# Patient Record
Sex: Female | Born: 1985 | Race: Black or African American | Hispanic: No | Marital: Married | Smoking: Never smoker
Health system: Southern US, Community
[De-identification: ages and names within clinical notes are randomized; demographics above are authoritative.]

## PROBLEM LIST (undated history)

## (undated) DIAGNOSIS — Z9289 Personal history of other medical treatment: Secondary | ICD-10-CM

## (undated) DIAGNOSIS — Z973 Presence of spectacles and contact lenses: Secondary | ICD-10-CM

## (undated) DIAGNOSIS — O021 Missed abortion: Secondary | ICD-10-CM

## (undated) DIAGNOSIS — R011 Cardiac murmur, unspecified: Secondary | ICD-10-CM

## (undated) DIAGNOSIS — D571 Sickle-cell disease without crisis: Secondary | ICD-10-CM

## (undated) DIAGNOSIS — E559 Vitamin D deficiency, unspecified: Secondary | ICD-10-CM

## (undated) DIAGNOSIS — L309 Dermatitis, unspecified: Secondary | ICD-10-CM

## (undated) DIAGNOSIS — G8929 Other chronic pain: Secondary | ICD-10-CM

## (undated) HISTORY — DX: Sickle-cell disease without crisis: D57.1

## (undated) HISTORY — PX: CHOLECYSTECTOMY: SHX55

## (undated) HISTORY — DX: Personal history of other medical treatment: Z92.89

## (undated) HISTORY — DX: Cardiac murmur, unspecified: R01.1

## (undated) NOTE — *Deleted (*Deleted)
[facility] - Clinic Note  09/05/2020     CHIEF COMPLAINT Patient presents for No chief complaint on file.   HISTORY OF PRESENT ILLNESS: Krista [name] is a 13 y.o. female who presents to the clinic today for:   Pt saw Dr. Sherryll Burger on 10.05.21 and received IVO OS, pt states in the last month or 2, she feels like her left eye is affecting her overall vision, she states she has to wear her glasses more often especially while driving  Referring physician: DeMarco, [place] OD [address] [place], [place] [address]   HISTORICAL INFORMATION:   Selected notes from the MEDICAL RECORD NUMBER Referral from Dr. [place] DeMarco for macular edema OS  LEE: 11.18.20, BCVA OD: 20/20 OS: 20/30+2  PMH: sickle cell disease, chronic pain, heart murmer, hx of retinal vasculitits    CURRENT MEDICATIONS: Current Outpatient Medications (Ophthalmic Drugs)  Medication Sig  . Bromfenac Sodium (PROLENSA) 0.07 % SOLN Place 1 drop into the left eye 4 (four) times daily.  . dorzolamide-timolol (COSOPT) 22.3-6.8 MG/ML ophthalmic solution Place 1 drop into the left eye 2 (two) times daily.  Marland Kitchen ketorolac (ACULAR) 0.5 % ophthalmic solution Place 1 drop into the left eye 4 (four) times daily. (Patient not taking: Reported on 08/27/2020)  . prednisoLONE acetate (PRED FORTE) 1 % ophthalmic suspension Place 1 drop into the left eye 4 (four) times daily.   No current facility-administered medications for this visit. (Ophthalmic Drugs)   Current Outpatient Medications (Other)  Medication Sig  . Cholecalciferol (VITAMIN D3) 25 MCG (1000 UT) CAPS TAKE 1 CAPSULE BY MOUTH ONCE DAILY  . folic acid (FOLVITE) 1 MG tablet Take 1 tablet (1 mg total) by mouth daily.  Marland Kitchen ibuprofen (ADVIL) 800 MG tablet Take 1 tablet (800 mg total) by mouth every 8 (eight) hours as needed. (Patient taking differently: Take 800 mg by mouth every 8 (eight) hours as needed for moderate pain. )  . mometasone (ELOCON)  0.1 % cream Apply 1 application topically as needed.  Marland Kitchen oxycodone (OXY-IR) 5 MG capsule Take 1 capsule (5 mg total) by mouth every 4 (four) hours as needed.  . Prenatal Vit-Fe Fumarate-FA (PRENATAL MULTIVITAMIN) TABS tablet Take 1 tablet by mouth daily at 12 noon. (Patient not taking: Reported on 08/27/2020)   No current facility-administered medications for this visit. (Other)      REVIEW OF SYSTEMS:    ALLERGIES Allergies  Allergen Reactions  . Keflex [Cephalexin] Swelling    Face swelling    PAST MEDICAL HISTORY Past Medical History:  Diagnosis Date  . Chronic pain    with sickle cell   . Eczema   . Heart murmur    07-13-2019  per pt since childhood,  has not ever had a echo done ,  asymptomatic  . History of blood transfusion    x3   . Missed ab   . Sickle cell anemia (HCC)    followed by pcp---  last crisis 12-14-2018  . Vitamin D deficiency   . Wears glasses    Past Surgical History:  Procedure Laterality Date  . EYE SURGERY Left 11/12/2019  . LAPAROSCOPIC CHOLECYSTECTOMY  1997    FAMILY HISTORY Family History  Problem Relation Age of Onset  . Diabetes Father   . Hypertension Father   . Heart disease Brother   . Cancer Maternal Grandmother   . Heart disease Brother   . Breast cancer Paternal Aunt   . Cancer Paternal Uncle  SOCIAL HISTORY Social History   Tobacco Use  . Smoking status: Never Smoker  . Smokeless tobacco: Never Used  Vaping Use  . Vaping Use: Never used  Substance Use Topics  . Alcohol use: Not Currently    Comment: socially  . Drug use: Never         OPHTHALMIC EXAM:  Not recorded     IMAGING AND PROCEDURES  Imaging and Procedures for @TODAY @           ASSESSMENT/PLAN:    ICD-10-CM   1. 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 Retinal edema  H35.81 OCT, Retina - OU - Both Eyes  4. 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 Ocular hypertension of left eye  H40.052   6. Hypertensive retinopathy of both eyes  H35.033    1,2. Peripheral chorioretinal inflammation and vasculitis OS  - s/p IVO #1 w/ Dr. Sherryll Burger (03.23.21), s/p IVO #2 (10.05.21)  - had baby in August 2021  - pt reports vague history "vasculitis" OS -- first diagnosed in ?[place] in 2016 -- doesn't think she saw a retina specialist  - states in 2016 had an episode of seeing spots then vision loss OS, told she had "vasculitis"  - +vitreous cell  - exam shows peripapillary atrophy and pigment deposition  - OCT with diffuse peripheral ellipsoid loss with central sparing + central CME slightly increased today  - FA (12.02.20) shows mild, late perivascular leakage, peripheral midzone OS (very late appearing--likely increased signal if study were allowed to continue beyond 10 min); OD normal study  - BCVA slightly decreased to 20/50 from 20/40 OS, but 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 Vira Browns, Uveitis/Retina expert, who initiated extensive work up and advised laser PRP             - s/p PRP OS (01.25.21) -- good laser changes 360  - s/p IVO #1 (Dr. Sherryll Burger, 03.23.21), s/p IVO #2 (10.05.21)  - cont PF and Prolensa qid OS  - IOP elevated to 28 OS -- ?steroid response  - start Cosopt BID OS  - f/u 1-2 weeks, IOP check  3. Retinal edema  - OCT shows interval improvement in central CME/IRF, but FA has no corresponding central hyperfluorescent leakage/staining--petaloid or otherwise  4. History of Sickle Cell SS disease  - no SS retinopathy  5. Ocular Hypertension OS  - possible steroid response  - IOP 28 today  - will start Cosopt BID OS  Ophthalmic Meds Ordered this visit:  No orders of the defined types were placed in this encounter.      No follow-ups on file.  There are no Patient Instructions on file for this visit.   Explained the diagnoses, plan, and  follow up with the patient and they expressed understanding.  Patient expressed understanding of the importance of proper follow up care.   This document serves as a record of services personally performed by Karie Chimera, MD, PhD. It was created on their behalf by Glee Arvin. Manson Passey, OA an ophthalmic technician. The creation of this record is the provider's dictation and/or activities during the visit.    Electronically signed by: Glee Arvin. [place], [place] 11.17.2021 8:07 AM  Karie Chimera, M.D., Ph.D. Diseases & Surgery of the Retina and Vitreous [facility]  Abbreviations: M myopia (nearsighted); A astigmatism; H hyperopia (farsighted); P presbyopia; Mrx spectacle prescription;  CTL contact lenses; OD right eye; OS left eye; OU both eyes  XT exotropia; ET esotropia; PEK punctate epithelial keratitis; PEE punctate epithelial erosions; DES dry eye syndrome; MGD meibomian gland dysfunction; ATs artificial tears; PFAT's preservative free artificial tears; NSC nuclear sclerotic cataract; PSC posterior subcapsular cataract; ERM epi-retinal membrane; PVD posterior vitreous detachment; RD retinal detachment; DM diabetes mellitus; DR diabetic retinopathy; NPDR non-proliferative diabetic retinopathy; PDR proliferative diabetic retinopathy; CSME clinically significant macular edema; DME diabetic macular edema; dbh dot blot hemorrhages; CWS cotton wool spot; POAG primary open angle glaucoma; C/D cup-to-disc ratio; HVF humphrey visual field; GVF goldmann visual field; OCT optical coherence tomography; IOP intraocular pressure; BRVO Branch retinal vein occlusion; CRVO central retinal vein occlusion; CRAO central retinal artery occlusion; BRAO branch retinal artery occlusion; RT retinal tear; SB scleral buckle; PPV pars plana vitrectomy; VH Vitreous hemorrhage; PRP panretinal laser photocoagulation; IVK intravitreal kenalog; VMT vitreomacular traction; MH Macular hole;  NVD neovascularization of  the disc; NVE neovascularization elsewhere; AREDS age related eye disease study; ARMD age related macular degeneration; POAG primary open angle glaucoma; EBMD epithelial/anterior basement membrane dystrophy; ACIOL anterior chamber intraocular lens; IOL intraocular lens; PCIOL posterior chamber intraocular lens; Phaco/IOL phacoemulsification with intraocular lens placement; PRK photorefractive keratectomy; LASIK laser assisted in situ keratomileusis; HTN hypertension; DM diabetes mellitus; COPD chronic obstructive pulmonary disease

---

## 2007-07-17 ENCOUNTER — Emergency Department (HOSPITAL_COMMUNITY): Admission: EM | Admit: 2007-07-17 | Discharge: 2007-07-18 | Payer: Self-pay | Admitting: Emergency Medicine

## 2007-07-20 ENCOUNTER — Encounter (HOSPITAL_COMMUNITY): Admission: RE | Admit: 2007-07-20 | Discharge: 2007-09-20 | Payer: Self-pay | Admitting: Internal Medicine

## 2009-11-19 ENCOUNTER — Encounter: Admission: RE | Admit: 2009-11-19 | Discharge: 2009-11-19 | Payer: Self-pay | Admitting: Family Medicine

## 2010-04-28 ENCOUNTER — Encounter: Admission: RE | Admit: 2010-04-28 | Discharge: 2010-04-28 | Payer: Self-pay | Admitting: Internal Medicine

## 2011-07-29 LAB — HEPATIC FUNCTION PANEL
AST: 32
Albumin: 4.3
Alkaline Phosphatase: 61
Total Protein: 8

## 2011-07-29 LAB — DIFFERENTIAL
Basophils Absolute: 0.1
Eosinophils Relative: 0
Neutro Abs: 6

## 2011-07-29 LAB — URINALYSIS, ROUTINE W REFLEX MICROSCOPIC
Bilirubin Urine: NEGATIVE
Ketones, ur: NEGATIVE
Nitrite: NEGATIVE

## 2011-07-29 LAB — CBC
HCT: 17.4 — ABNORMAL LOW
Hemoglobin: 6.1 — CL
MCHC: 35
MCV: 92.3
Platelets: 385

## 2011-07-29 LAB — URINE MICROSCOPIC-ADD ON

## 2011-07-29 LAB — CROSSMATCH
ABO/RH(D): B POS
Antibody Screen: NEGATIVE

## 2011-07-29 LAB — BASIC METABOLIC PANEL
BUN: 11
Calcium: 9.6
Chloride: 101
Glucose, Bld: 107 — ABNORMAL HIGH
Potassium: 4

## 2011-07-29 LAB — PREGNANCY, URINE: Preg Test, Ur: NEGATIVE

## 2011-07-29 LAB — RETICULOCYTES: Retic Count, Absolute: 28.3

## 2013-02-03 ENCOUNTER — Ambulatory Visit (INDEPENDENT_AMBULATORY_CARE_PROVIDER_SITE_OTHER): Payer: BC Managed Care – PPO | Admitting: Physician Assistant

## 2013-02-03 VITALS — BP 112/73 | HR 103 | Temp 98.3°F | Resp 16 | Ht 68.5 in | Wt 137.8 lb

## 2013-02-03 DIAGNOSIS — R112 Nausea with vomiting, unspecified: Secondary | ICD-10-CM

## 2013-02-03 DIAGNOSIS — M2550 Pain in unspecified joint: Secondary | ICD-10-CM

## 2013-02-03 DIAGNOSIS — J029 Acute pharyngitis, unspecified: Secondary | ICD-10-CM

## 2013-02-03 DIAGNOSIS — R197 Diarrhea, unspecified: Secondary | ICD-10-CM

## 2013-02-03 DIAGNOSIS — D571 Sickle-cell disease without crisis: Secondary | ICD-10-CM | POA: Insufficient documentation

## 2013-02-03 DIAGNOSIS — D5700 Hb-SS disease with crisis, unspecified: Secondary | ICD-10-CM | POA: Insufficient documentation

## 2013-02-03 LAB — POCT CBC
Granulocyte percent: 57.7 %G (ref 37–80)
HCT, POC: 32.3 % — AB (ref 37.7–47.9)
Hemoglobin: 10 g/dL — AB (ref 12.2–16.2)
Lymph, poc: 3 (ref 0.6–3.4)
MCH, POC: 31.1 pg (ref 27–31.2)
MCHC: 31 g/dL — AB (ref 31.8–35.4)
MCV: 100.3 fL — AB (ref 80–97)
MID (cbc): 1 — AB (ref 0–0.9)
POC Granulocyte: 5.4 (ref 2–6.9)
POC LYMPH PERCENT: 32.1 %L (ref 10–50)
Platelet Count, POC: 624 10*3/uL — AB (ref 142–424)
WBC: 9.4 10*3/uL (ref 4.6–10.2)

## 2013-02-03 MED ORDER — MELOXICAM 15 MG PO TABS: 15.0000 mg | ORAL_TABLET | Freq: Every day | ORAL | Status: DC

## 2013-02-03 MED ORDER — ONDANSETRON 8 MG PO TBDP: 8.0000 mg | ORAL_TABLET | Freq: Three times a day (TID) | ORAL | Status: DC | PRN

## 2013-02-03 NOTE — Patient Instructions (Signed)
Get plenty of rest and drink at least 64 ounces of water daily. 

## 2013-02-03 NOTE — Progress Notes (Signed)
Subjective:    Patient ID: Krista [name], female    DOB: Mar 23, 1986, 27 y.o.   MRN: [id number]  HPI This 27 y.o. female presents for evaluation of nausea, vomiting and chills (after vomiting).  Symptoms began the evening of 02/01/2013.  Diarrhea began today.  No known sick contacts. No fever.  Achiness in her joints.  Crampy abdominal pain. Thinks she had a flu vaccine this season.   Past Medical History  Diagnosis Date  . Sickle cell anemia   . Heart murmur   . History of blood transfusion     Past Surgical History  Procedure Laterality Date  . Cholecystectomy      Prior to Admission medications   Medication Sig Start Date End Date Taking? Authorizing Provider  folic acid (FOLVITE) 1 MG tablet Take 1 mg by mouth daily.   Yes Historical Provider, MD  norgestimate-ethinyl estradiol (ORTHO-CYCLEN,SPRINTEC,PREVIFEM) 0.25-35 MG-MCG tablet Take 1 tablet by mouth daily.   Yes Historical Provider, MD    No Known Allergies  History   Social History  . Marital Status: Single    Spouse Name: n/a    Number of Children: 0  . Years of Education: 16+   Occupational History  . TEACHER [employer]   Social History Main Topics  . Smoking status: Never Smoker   . Smokeless tobacco: Never Used  . Alcohol Use: 0 - 1 oz/week    0-2 drink(s) per week     Comment: drinks only once a month a less.  . Drug Use: No  . Sexually Active: Yes   Other Topics Concern  . Not on file   Social History Narrative   Lives alone.  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 Currently teaches at [employer].    Family History  Problem Relation Age of Onset  . Diabetes Father   . Heart disease Brother   . Cancer Maternal Grandmother   . Heart disease Brother     Review of Systems As above.    Objective:   Physical Exam Blood pressure 112/73, pulse 103, temperature 98.3 F (36.8 C), temperature source Oral, resp. rate 16, height 5' 8.5" (1.74  m), weight 137 lb 12.8 oz (62.506 kg), last menstrual period 01/03/2013, SpO2 99.00%. Body mass index is 20.65 kg/(m^2). Well-developed, well nourished BF who is awake, alert and oriented, in NAD. HEENT: [place]/AT, PERRL, EOMI.  Sclera and conjunctiva are clear.  EAC are patent, TMs are normal in appearance. Nasal mucosa is pink and moist. OP is clear. Neck: supple, non-tender, no lymphadenopathy, thyromegaly. Heart: RRR, no murmur Lungs: normal effort, CTA Abdomen: normo-active bowel sounds, supple, no mass or organomegaly. Epigastric abdominal tenderness.  No referred or rebound tenderness.  No guarding. Extremities: no cyanosis, clubbing or edema. Skin: warm and dry without rash. Psychologic: good mood and appropriate affect, normal speech and behavior.    Results for orders placed in visit on 02/03/13  POCT INFLUENZA A/B      Result Value Range   Influenza A, POC Negative     Influenza B, POC Negative    POCT RAPID STREP A (OFFICE)      Result Value Range   Rapid Strep A Screen Negative  Negative  POCT CBC      Result Value Range   WBC 9.4  4.6 - 10.2 K/uL   Lymph, poc 3.0  0.6 - 3.4   POC LYMPH PERCENT 32.1  10 - 50 %L   MID (  cbc) 1.0 (*) 0 - 0.9   POC MID % 10.2  0 - 12 %M   POC Granulocyte 5.4  2 - 6.9   Granulocyte percent 57.7  37 - 80 %G   RBC 3.22 (*) 4.04 - 5.48 M/uL   Hemoglobin 10.0 (*) 12.2 - 16.2 g/dL   HCT, POC 16.1 (*) 09.6 - 47.9 %   MCV 100.3 (*) 80 - 97 fL   MCH, POC 31.1  27 - 31.2 pg   MCHC 31.0 (*) 31.8 - 35.4 g/dL   RDW, POC 04.5     Platelet Count, POC 624 (*) 142 - 424 K/uL   MPV 8.2  0 - 99.8 fL       Assessment & Plan:  Nausea with vomiting, Diarrhea - Plan: ondansetron (ZOFRAN-ODT) 8 MG disintegrating tablet  Acute pharyngitis - Plan: POCT Influenza A/B, POCT rapid strep A, POCT CBC, meloxicam (MOBIC) 15 MG tablet  Joint pain - Plan: meloxicam (MOBIC) 15 MG tablet  Supportive care, anticipatory guidance provided.  Fernande Bras,  PA-C Physician Assistant-Certified [facility]

## 2013-02-05 ENCOUNTER — Telehealth: Payer: Self-pay | Admitting: Radiology

## 2013-02-05 ENCOUNTER — Other Ambulatory Visit: Payer: Self-pay | Admitting: Radiology

## 2013-02-05 ENCOUNTER — Ambulatory Visit (INDEPENDENT_AMBULATORY_CARE_PROVIDER_SITE_OTHER): Payer: BC Managed Care – PPO | Admitting: Family Medicine

## 2013-02-05 ENCOUNTER — Encounter: Payer: Self-pay | Admitting: Family Medicine

## 2013-02-05 ENCOUNTER — Ambulatory Visit: Payer: BC Managed Care – PPO

## 2013-02-05 ENCOUNTER — Ambulatory Visit
Admission: RE | Admit: 2013-02-05 | Discharge: 2013-02-05 | Disposition: A | Discharge status: Home / Self Care | Payer: BC Managed Care – PPO | Source: Ambulatory Visit | Attending: Family Medicine | Admitting: Family Medicine

## 2013-02-05 VITALS — BP 103/68 | HR 81 | Temp 98.2°F | Resp 16 | Ht 67.5 in | Wt 136.8 lb

## 2013-02-05 DIAGNOSIS — D571 Sickle-cell disease without crisis: Secondary | ICD-10-CM

## 2013-02-05 DIAGNOSIS — R7989 Other specified abnormal findings of blood chemistry: Secondary | ICD-10-CM

## 2013-02-05 DIAGNOSIS — R079 Chest pain, unspecified: Secondary | ICD-10-CM

## 2013-02-05 LAB — POCT CBC
Granulocyte percent: 59.7 %G (ref 37–80)
HCT, POC: 27.6 % — AB (ref 37.7–47.9)
Hemoglobin: 8.8 g/dL — AB (ref 12.2–16.2)
Lymph, poc: 2.9 (ref 0.6–3.4)
MCH, POC: 31.7 pg — AB (ref 27–31.2)
MCHC: 31.9 g/dL (ref 31.8–35.4)
MCV: 99.4 fL — AB (ref 80–97)
MID (cbc): 1 — AB (ref 0–0.9)
MPV: 8.1 fL (ref 0–99.8)
POC Granulocyte: 5.9 (ref 2–6.9)
POC LYMPH PERCENT: 29.6 %L (ref 10–50)
POC MID %: 10.7 %M (ref 0–12)
Platelet Count, POC: 562 10*3/uL — AB (ref 142–424)
RBC: 2.78 M/uL — AB (ref 4.04–5.48)
RDW, POC: 15.5 %
WBC: 9.8 10*3/uL (ref 4.6–10.2)

## 2013-02-05 LAB — D-DIMER, QUANTITATIVE: D-Dimer, Quant: 0.58 ug/mL-FEU — ABNORMAL HIGH (ref 0.00–0.48)

## 2013-02-05 MED ORDER — HYDROCODONE-HOMATROPINE 5-1.5 MG/5ML PO SYRP: 5.0000 mL | ORAL_SOLUTION | Freq: Three times a day (TID) | ORAL | Status: DC | PRN

## 2013-02-05 MED ORDER — AZITHROMYCIN 250 MG PO TABS: ORAL_TABLET | ORAL | Status: DC

## 2013-02-05 MED ORDER — IOHEXOL 350 MG/ML SOLN
100.0000 mL | Freq: Once | INTRAVENOUS | Status: AC | PRN
  Administered 2013-02-05: 100 mL via INTRAVENOUS

## 2013-02-05 NOTE — Telephone Encounter (Signed)
Called patient advised no blood clots seen in lungs on scan, patient is to take her antibiotics,and report any increased pain to [place], to ER if severe. To you FYI

## 2013-02-05 NOTE — Telephone Encounter (Signed)
Called patient to advise her CT scan is scheduled for today at 315 at [address]. Lupita Leash has gotten precert [id number].

## 2013-02-05 NOTE — Patient Instructions (Signed)
Ask your doctor about a pneumonia vaccine  Sickle Cell Anemia Sickle cell anemia needs regular medical care by your caregiver and awareness by you when to seek medical care. Pain is a common problem in children with sickle cell disease. This usually starts at less than 26 year of age. Pain can occur nearly anywhere in the body, but most commonly happens in the extremities, back, chest, or belly (abdomen). Pain episodes can start suddenly or may follow an illness. These attacks can appear as decreased activity, loss of appetite, change in behavior, or simply complaints of pain. DIAGNOSIS   Specialized blood and gene testing can help make this diagnosis early in the disease. Blood tests may then be done to watch blood levels.  Specialized brain scans are done when there are problems in the brain during a crisis.  Lung testing may be done later in the disease. HOME CARE INSTRUCTIONS   Maintain good hydration. Increase your child's fluid intake in hot weather and during exercise.  Avoid smoking around your child. Smoking lowers the oxygen in the blood and can cause sickling.  Control pain. Only give your child over-the-counter or prescription medicines for pain, discomfort, or fever as directed by their caregiver. Do not give aspirin to children because of the association with Reye's syndrome.  Keep regular health care checks to keep a proper red blood cell (hemoglobin) level. A moderate anemia level protects against sickling crises.  You or your child should receive all the same immunizations and care as the people around them.  Moms should breastfeed their babies if possible. Use formulas with iron added if breastfeeding is not possible. Additional iron should not be given unless there is a lack of it. People with SCD build up iron faster than normal. Give folic acid and additional vitamins as directed.  If you or your child has been prescribed antibiotics or other medications to prevent  problems, give them as directed.  Summer camps are available for children with SCD. They may help young people deal with their disease. The camps introduce them to other children with the same condition.  Young people with SCD may become frustrated or angry at their disease. This can cause rebellion and refusal to follow medical care. Help groups or counseling may help with these problems.  Make sure your child wears a medical alert bracelet. When traveling, keep your medical information, caregiver's names, and the medications your child takes with you at all times. SEEK IMMEDIATE MEDICAL CARE IF:   You or your child feels dizzy or faint.  You or your child develops a new onset of abdominal pain, especially on the left side near the stomach area.  You or your child develops a persistent, often uncomfortable and painful penile erection. This is called priapism. Always check young boys for this. It is often embarrassing for them and they may not bring it to your attention. This is a medical emergency and needs immediate treatment. If this is not treated it will lead to impotence.  You or your child develops numbness in or has a hard time moving arms and legs.  You or your child has a hard time with speech.  You have a fever.  You or your child develops signs of infection (chills, lethargy, irritability, poor eating, vomiting). The younger the child, the more you should be concerned.  With fevers, do not give medications to lower the fever right away. This could cover up a problem that is developing. Notify your caregiver.  You or your child develops pain that is not helped with medicine.  You or your child develops shortness of breath, or is coughing up pus-like or bloody sputum.  You or your child develops any problems that are new and are causing you to worry. Document Released: 01/12/2006 Document Revised: 12/27/2011 Document Reviewed: 03/04/2010 [facility] Patient Information 2013  [place], [place].

## 2013-02-05 NOTE — Progress Notes (Signed)
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  Patient had this problem once before (2011) and was diagnosed with pneumonia.  Patient was seen two days ago with gastroenteritis which began last Thursday and seemed to be improving over the weekend.  Also, she notes sinus congestion and cough for a week.  No fever.  Family hx positive for diabetes and hypertension (father) and cancer (grandmother).  Mother is healthy.  PMHx:  Sickle cell anemia.  Has had three [place] crises (last was 2008-9)   Objective:  NAD, alert and cooperative Nose:  Hyperemic without exudates Oroph:  Clear Neck:  Supple Chest:  Clear Heart: regular with holosystolic II/VI murmur  [facility] reading (PRIMARY) by  Dr. Milus Glazier:  Cardiomegaly Results for orders placed in visit on 02/05/13  POCT CBC      Result Value Range   WBC 9.8  4.6 - 10.2 K/uL   Lymph, poc 2.9  0.6 - 3.4   POC LYMPH PERCENT 29.6  10 - 50 %L   MID (cbc) 1.0 (*) 0 - 0.9   POC MID % 10.7  0 - 12 %M   POC Granulocyte 5.9  2 - 6.9   Granulocyte percent 59.7  37 - 80 %G   RBC 2.78 (*) 4.04 - 5.48 M/uL   Hemoglobin 8.8 (*) 12.2 - 16.2 g/dL   HCT, POC 01.0 (*) 27.2 - 47.9 %   MCV 99.4 (*) 80 - 97 fL   MCH, POC 31.7 (*) 27 - 31.2 pg   MCHC 31.9  31.8 - 35.4 g/dL   RDW, POC 53.6     Platelet Count, POC 562 (*) 142 - 424 K/uL   MPV 8.1  0 - 99.8 fL   .Assessment: 27 yo woman with sickle cell disease, enlarged heart shadow, chest ache and cough.  Obviously, sequestration or clotting disorder are in the differential, but patient is very comfortable and pulse ox is normal, so this would be very unlikely.  Plan: Chest pain - Plan: D-dimer, quantitative, POCT CBC, DG Chest 2 View, azithromycin (ZITHROMAX Z-PAK) 250 MG tablet, HYDROcodone-homatropine (HYCODAN) 5-1.5 MG/5ML syrup  Sickle cell anemia  Patient advised to discuss cardiomegaly and pneumovax with PCP.  D-dimer pending(if positive, proceed with CT  angio of chest)  Signed, Elvina Sidle

## 2015-08-12 ENCOUNTER — Encounter: Payer: Self-pay | Admitting: Internal Medicine

## 2015-08-12 ENCOUNTER — Ambulatory Visit (INDEPENDENT_AMBULATORY_CARE_PROVIDER_SITE_OTHER): Payer: BLUE CROSS/BLUE SHIELD | Admitting: Internal Medicine

## 2015-08-12 VITALS — BP 117/76 | HR 76 | Temp 97.7°F | Resp 18 | Ht 67.0 in | Wt 161.0 lb

## 2015-08-12 DIAGNOSIS — Z23 Encounter for immunization: Secondary | ICD-10-CM

## 2015-08-12 DIAGNOSIS — K219 Gastro-esophageal reflux disease without esophagitis: Secondary | ICD-10-CM | POA: Insufficient documentation

## 2015-08-12 DIAGNOSIS — D473 Essential (hemorrhagic) thrombocythemia: Secondary | ICD-10-CM | POA: Diagnosis not present

## 2015-08-12 DIAGNOSIS — Z0000 Encounter for general adult medical examination without abnormal findings: Secondary | ICD-10-CM

## 2015-08-12 DIAGNOSIS — D571 Sickle-cell disease without crisis: Secondary | ICD-10-CM | POA: Diagnosis not present

## 2015-08-12 DIAGNOSIS — D75839 Thrombocytosis, unspecified: Secondary | ICD-10-CM

## 2015-08-12 LAB — LIPID PANEL
CHOLESTEROL: 112 mg/dL — AB (ref 125–200)
HDL: 26 mg/dL — ABNORMAL LOW (ref >=46)
LDL Cholesterol: 67 mg/dL (ref <130)
TRIGLYCERIDES: 95 mg/dL (ref <150)
Total CHOL/HDL Ratio: 4.3 Ratio (ref <=5.0)
VLDL: 19 mg/dL (ref <30)

## 2015-08-12 LAB — COMPLETE METABOLIC PANEL WITH GFR
ALBUMIN: 4.3 g/dL (ref 3.6–5.1)
ALK PHOS: 88 U/L (ref 33–115)
ALT: 21 U/L (ref 6–29)
AST: 20 U/L (ref 10–30)
BILIRUBIN TOTAL: 2.4 mg/dL — AB (ref 0.2–1.2)
BUN: 7 mg/dL (ref 7–25)
CALCIUM: 9.2 mg/dL (ref 8.6–10.2)
CO2: 21 mmol/L (ref 20–31)
CREATININE: 0.46 mg/dL — AB (ref 0.50–1.10)
Chloride: 105 mmol/L (ref 98–110)
GFR, Est Non African American: >89 mL/min (ref >=60)
Glucose, Bld: 85 mg/dL (ref 65–99)
Potassium: 4.2 mmol/L (ref 3.5–5.3)
Sodium: 138 mmol/L (ref 135–146)
TOTAL PROTEIN: 7.2 g/dL (ref 6.1–8.1)

## 2015-08-12 LAB — ACUTE HEP PANEL AND HEP B SURFACE AB
HCV AB: NEGATIVE
HEP A IGM: NONREACTIVE
HEP B C IGM: NONREACTIVE
HEP B S AB: POSITIVE — AB
Hepatitis B Surface Ag: NEGATIVE

## 2015-08-12 LAB — FOLATE: FOLATE: 17 ng/mL

## 2015-08-12 LAB — HIV ANTIBODY (ROUTINE TESTING W REFLEX): HIV 1&2 Ab, 4th Generation: NONREACTIVE

## 2015-08-12 LAB — FERRITIN: Ferritin: 469 ng/mL — ABNORMAL HIGH (ref 10–291)

## 2015-08-12 LAB — IRON AND TIBC
%SAT: 37 % (ref 11–50)
IRON: 99 ug/dL (ref 40–190)
TIBC: 266 ug/dL (ref 250–450)
UIBC: 167 ug/dL (ref 125–400)

## 2015-08-12 LAB — VITAMIN B12: VITAMIN B 12: 357 pg/mL (ref 211–911)

## 2015-08-12 LAB — TSH: TSH: 1.265 u[IU]/mL (ref 0.350–4.500)

## 2015-08-12 MED ORDER — IBUPROFEN 800 MG PO TABS: 800.0000 mg | ORAL_TABLET | Freq: Three times a day (TID) | ORAL | Status: DC | PRN

## 2015-08-12 MED ORDER — PANTOPRAZOLE SODIUM 40 MG PO TBEC: 40.0000 mg | DELAYED_RELEASE_TABLET | Freq: Every day | ORAL | Status: DC

## 2015-08-12 MED ORDER — FOLIC ACID 1 MG PO TABS: 1.0000 mg | ORAL_TABLET | Freq: Every day | ORAL | Status: DC

## 2015-08-12 NOTE — Progress Notes (Signed)
Patient is here to establish care.  Patient complains of knee pain present for three days, described as an ache when bending or walking. Pain radiates from knee down to ankles.   [facility] has patients lab results. Patient states she was referred because her platelets were low.

## 2015-08-12 NOTE — Patient Instructions (Signed)
Reactive Thrombocytosis Reactive thrombocytosis is when you have too many platelets (thrombocytes) in your blood. Platelets are tiny elements in the blood that stick together and form a clot (thrombus). Platelets help your body stop bleeding. Conditions that cause inflammation, such as cancer, may trigger your body to make more platelets than normal. This condition may also be called secondary thrombocytosis. CAUSES Many things can cause this condition, including:  Having your spleen surgically removed (splenectomy).  Injury (trauma).  Certain infections.  Very bad bleeding.  Low red blood cell count from not having enough iron (iron deficiency anemia).  Having a disease that destroys your red blood cells (hemolytic anemia).  Not having enough vitamin B-12.  Inflammatory bowel disease (Crohn disease or ulcerative colitis).  Cancer, especially lymphoma, breast, stomach, and ovarian cancers.  Alcohol abuse.  Certain medicines. SYMPTOMS It can be hard to tell the difference between symptoms of reactive thrombocytosis and symptoms of the underlying condition. If you have symptoms, they may include:   Weakness.  Headache.  Dizziness or confusion.  Chest pain or shortness of breath.  Tingling or burning in your hands or feet. DIAGNOSIS This condition may be diagnosed based on routine blood tests or while you are being evaluated for another condition. You may also need tests to confirm the diagnosis. These may include:  Additionalblood tests.   A procedure to collect a sample of bone marrow (bone marrow aspiration). TREATMENT Treatment for this condition depends on the cause. Your platelet count may return to normal after the underlying cause is treated. If your platelet count is very high, you may have to take medicine to prevent blood clots as told by your health care provider.  HOME CARE INSTRUCTIONS  Take over-the-counter and prescription medicines only as told by your  health care provider.  Work with your health care provider to:  Treat the condition causing reactive thrombocytosis.  Control any other conditions you may have, such as high blood pressure, high cholesterol, and diabetes.  Do not smoke. If you need help quitting, talk to your health care provider.  Keep all follow-up visits as told by your health care provider. This is important. SEEK MEDICAL CARE IF:  You have a headache that you cannot control.  You faint. SEEK IMMEDIATE MEDICAL CARE IF:  You suddenly develop a headache, weakness, or drooping in your face.  You suddenly cannot speak or understand speech.  You have chest pain.  You have trouble breathing.   This information is not intended to replace advice given to you by your health care provider. Make sure you discuss any questions you have with your health care provider.   Document Released: 06/25/2015 Document Reviewed: 01/08/2015 Elsevier Interactive Patient Education 2016 Elsevier Inc. Sickle Cell Anemia, Adult Sickle cell anemia is a condition in which red blood cells have an abnormal "sickle" shape. This abnormal shape shortens the cells' life span, which results in a lower than normal concentration of red blood cells in the blood. The sickle shape also causes the cells to clump together and block free blood flow through the blood vessels. As a result, the tissues and organs of the body do not receive enough oxygen. Sickle cell anemia causes organ damage and pain and increases the risk of infection. CAUSES  Sickle cell anemia is a genetic disorder. Those who receive two copies of the gene have the condition, and those who receive one copy have the trait. RISK FACTORS The sickle cell gene is most common in people whose families  originated in [place]. Other areas of the globe where sickle cell trait occurs include the [place], [place] and [place], and the [place].  SIGNS AND  SYMPTOMS  Pain, especially in the extremities, back, chest, or abdomen (common). The pain may start suddenly or may develop following an illness, especially if there is dehydration. Pain can also occur due to overexertion or exposure to extreme temperature changes.  Frequent severe bacterial infections, especially certain types of pneumonia and meningitis.  Pain and swelling in the hands and feet.  Decreased activity.   Loss of appetite.   Change in behavior.  Headaches.  Seizures.  Shortness of breath or difficulty breathing.  Vision changes.  Skin ulcers. Those with the trait may not have symptoms or they may have mild symptoms.  DIAGNOSIS  Sickle cell anemia is diagnosed with blood tests that demonstrate the genetic trait. It is often diagnosed during the newborn period, due to mandatory testing nationwide. A variety of blood tests, X-rays, CT scans, MRI scans, ultrasounds, and lung function tests may also be done to monitor the condition. TREATMENT  Sickle cell anemia may be treated with:  Medicines. You may be given pain medicines, antibiotic medicines (to treat and prevent infections) or medicines to increase the production of certain types of hemoglobin.  Fluids.  Oxygen.  Blood transfusions. HOME CARE INSTRUCTIONS   Drink enough fluid to keep your urine clear or pale yellow. Increase your fluid intake in hot weather and during exercise.  Do not smoke. Smoking lowers oxygen levels in the blood.   Only take over-the-counter or prescription medicines for pain, fever, or discomfort as directed by your health care provider.  Take antibiotics as directed by your health care provider. Make sure you finish them it even if you start to feel better.   Take supplements as directed by your health care provider.   Consider wearing a medical alert bracelet. This tells anyone caring for you in an emergency of your condition.   When traveling, keep your medical  information, health care provider's names, and the medicines you take with you at all times.   If you develop a fever, do not take medicines to reduce the fever right away. This could cover up a problem that is developing. Notify your health care provider.  Keep all follow-up appointments with your health care provider. Sickle cell anemia requires regular medical care. SEEK MEDICAL CARE IF: You have a fever. SEEK IMMEDIATE MEDICAL CARE IF:   You feel dizzy or faint.   You have new abdominal pain, especially on the left side near the stomach area.   You develop a persistent, often uncomfortable and painful penile erection (priapism). If this is not treated immediately it will lead to impotence.   You have numbness your arms or legs or you have a hard time moving them.   You have a hard time with speech.   You have a fever or persistent symptoms for more than 2-3 days.   You have a fever and your symptoms suddenly get worse.   You have signs or symptoms of infection. These include:   Chills.   Abnormal tiredness (lethargy).   Irritability.   Poor eating.   Vomiting.   You develop pain that is not helped with medicine.   You develop shortness of breath.  You have pain in your chest.   You are coughing up pus-like or bloody sputum.   You develop a stiff neck.  Your feet or hands  swell or have pain.  Your abdomen appears bloated.  You develop joint pain. MAKE SURE YOU:  Understand these instructions.   This information is not intended to replace advice given to you by your health care provider. Make sure you discuss any questions you have with your health care provider.   Document Released: 01/12/2006 Document Revised: 10/25/2014 Document Reviewed: 05/16/2013 Elsevier Interactive Patient Education [employer].

## 2015-08-12 NOTE — Progress Notes (Signed)
Patient ID: Krista [name], female   DOB: Mar 28, 1986, 29 y.o.   MRN: [id number]   Krista [name], is a 29 y.o. female  [id number]:[id number]  [id number]  DOB - 1985-10-23  CC:  Chief Complaint  Patient presents with  . New Patient (Initial Visit)       HPI: Krista [name] is a 29 y.o. 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 Patient has history of sickle cell disease, went for eye exam with the ophthalmologist/retina specialist recently where she was diagnosed with vasculitis, her lab results were reviewed and was found to have thrombocytosis of about 600,000 according to patient, and was referred to our clinic today for reevaluation and management. Patient has no symptoms. She said her sickle cell disease has been well managed, she is only on NSAID over-the-counter when necessary for pain, she is never being on narcotics, she is not on hydroxyurea or folic acid or any other medication at this time. She currently has occasional knee pains but nothing out of the ordinary and she only takes ibuprofen as needed with relief. She does also have occasional epigastric pain but no nausea, no vomiting, no diarrhea, no constipation, no abdominal distention. She has had cholecystectomy in the past. She is not sure if she still has her spleen but there was no surgery to remove her spleen. She has not had her flu or pneumococcal shot.. She thinks that she may have had only 3 episodes of sickle cell crisis in her lifetime, the last being years ago. 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 She came to [facility] in 2006 for college but originally from [place] where she spent most of her childhood, her mother later relocated with her to [place], and she subsequently came to [place] in 2006. She has no new complaint today. She is up-to-date with her Pap smear. Patient  has No headache, No chest pain, No abdominal pain - No Nausea, No new weakness tingling or numbness, No Cough - SOB. She is married, no genetic counseling yet, no plans to have kids until after graduation from [employer] which would be about 3 years from now. She does not smoke cigarettes, she does not drink alcohol.  Not on File Past Medical History  Diagnosis Date  . Sickle cell anemia (HCC)    No current outpatient prescriptions on file prior to visit.   No current facility-administered medications on file prior to visit.   Family History  Problem Relation Age of Onset  . Diabetes Father   . Hypertension Father    Social History   Social History  . Marital Status: Married    Spouse Name: N/A  . Number of Children: N/A  . Years of Education: N/A   Occupational History  . Not on file.   Social History Main Topics  . Smoking status: Never Smoker   . Smokeless tobacco: Not on file  . Alcohol Use: Yes     Comment: socially  . Drug Use: No  . Sexual Activity: Yes    Birth Control/ Protection: Implant   Other Topics Concern  . Not on file   Social History Narrative  . No narrative on file    Review of Systems: Constitutional: Negative for fever, chills, diaphoresis, activity change, appetite change and fatigue. HENT: Negative for ear pain, nosebleeds, congestion, facial swelling, rhinorrhea, neck pain, neck stiffness and ear discharge.  Eyes: Negative for pain, discharge, redness, itching and visual disturbance. Respiratory: Negative for cough, choking, chest tightness, shortness of breath, wheezing and stridor.  Cardiovascular: Negative for chest pain, palpitations and leg swelling. Gastrointestinal: Negative for abdominal distention. Genitourinary: Negative for dysuria, urgency, frequency, hematuria, flank pain, decreased urine volume, difficulty urinating and dyspareunia.  Musculoskeletal: Negative for back pain, but has knee pains occasionally Neurological:  Negative for dizziness, tremors, seizures, syncope, facial asymmetry, speech difficulty, weakness, light-headedness, numbness and headaches.  Hematological: Negative for adenopathy. Does not bruise/bleed easily. Psychiatric/Behavioral: Negative for hallucinations, behavioral problems, confusion, dysphoric mood, decreased concentration and agitation.    Objective:   Filed Vitals:   08/12/15 0952  BP: 117/76  Pulse: 76  Temp: 97.7 F (36.5 C)  Resp: 18    Physical Exam: Constitutional: Patient appears well-developed and well-nourished. No distress. Wears medicated glasses HENT: Normocephalic, atraumatic, External right and left ear normal. Oropharynx is clear and moist.  Eyes: Conjunctivae and EOM are normal. PERRLA, no scleral icterus. Neck: Normal ROM. Neck supple. No JVD. No tracheal deviation. No thyromegaly. CVS: RRR, S1/S2 +, no murmurs, no gallops, no carotid bruit.  Pulmonary: Effort and breath sounds normal, no stridor, rhonchi, wheezes, rales.  Abdominal: Soft. BS +, no distension, Epigastric tenderness ++, no rebound or guarding.  Musculoskeletal: Normal range of motion. No edema and no tenderness.  Lymphadenopathy: No lymphadenopathy noted, cervical, inguinal or axillary Neuro: Alert. Normal reflexes, muscle tone coordination. No cranial nerve deficit. Skin: Skin is warm and dry. No rash noted. Not diaphoretic. No erythema. No pallor. Psychiatric: Normal mood and affect. Behavior, judgment, thought content normal.  No results found for: WBC, HGB, HCT, MCV, PLT No results found for: CREATININE, BUN, NA, K, CL, CO2  No results found for: HGBA1C Lipid Panel  No results found for: CHOL, TRIG, HDL, CHOLHDL, VLDL, LDLCALC     Assessment and plan:   1. Hb-SS disease without crisis ([place])  - CBC with Differential/Platelet - COMPLETE METABOLIC PANEL WITH GFR - Lipid panel - TSH - Urinalysis, Complete - Vit D  25 hydroxy (rtn osteoporosis monitoring) - ibuprofen  (ADVIL,MOTRIN) 800 MG tablet; Take 1 tablet (800 mg total) by mouth every 8 (eight) hours as needed.  Dispense: 60 tablet; Refill: 3 - folic acid (FOLVITE) 1 MG tablet; Take 1 tablet (1 mg total) by mouth daily.  Dispense: 90 tablet; Refill: 3  2. Thrombocytosis (HCC)  - CBC with Differential/Platelet - Folate - Vitamin B12 - Iron and TIBC - Ferritin - HIV antibody (with reflex) - Acute Hep Panel & Hep B Surface Ab  3. Gastroesophageal reflux disease without esophagitis  - pantoprazole (PROTONIX) 40 MG tablet; Take 1 tablet (40 mg total) by mouth daily.  Dispense: 30 tablet; Refill: 3  General Management: Sickle cell disease - We discussed the need for good hydration, monitoring of hydration status, avoidance of heat, cold, stress, and infection triggers. Start folic acid 1 mg daily.   Pulmonary evaluation - Patient denies severe recurrent wheezes, shortness of breath with exercise, or persistent cough. If these symptoms develop, pulmonary function tests with spirometry will be ordered, and if abnormal, plan on referral to Pulmonology for further evaluation.  Cardiac - Routine screening for pulmonary hypertension is not recommended.  Eye - High risk of proliferative retinopathy.  Patient was recently seen by retinal specialist, she was diagnosed with "vasculitis" and was prescribed some eyedrops , she has follow-up visit common up.  Immunization status - Influenza vaccination and Pneumococcal vaccines given today.  Acute and chronic painful episodes -  Patient is not on any narcotics and she does not want to be on any at this time. She will continue ibuprofen 800 mg tablet by mouth every 6 - 8 hours when necessary pain.  Patient to sign medical record release form, we will request her medical record from the ophthalmologists, primary care physician and hematologist.  Patient had been advised of genetic counseling and hemoglobin electrophoresis for her husband before starting a  family  Return in about 4 weeks (around 09/09/2015) for Sickle Cell Disease/Pain.  The patient was given clear instructions to go to ER or return to medical center if symptoms don't improve, worsen or new problems develop. The patient verbalized understanding. The patient was told to call to get lab results if they haven't heard anything in the next week.     This note has been created with Surveyor, quantity. Any transcriptional errors are unintentional.    Angelica Chessman, MD, [place], Karilyn Cota, [place], [place]   08/12/2015, 10:57 AM

## 2015-08-13 LAB — CBC WITH DIFFERENTIAL/PLATELET
BASOS ABS: 0.1 10*3/uL (ref 0.0–0.1)
Basophils Relative: 1 % (ref 0–1)
EOS PCT: 1 % (ref 0–5)
Eosinophils Absolute: 0.1 10*3/uL (ref 0.0–0.7)
HEMATOCRIT: 27.6 % — AB (ref 36.0–46.0)
HEMOGLOBIN: 9.5 g/dL — AB (ref 12.0–15.0)
LYMPHS ABS: 3.3 10*3/uL (ref 0.7–4.0)
LYMPHS PCT: 33 % (ref 12–46)
MCH: 30.8 pg (ref 26.0–34.0)
MCHC: 34.4 g/dL (ref 30.0–36.0)
MCV: 89.6 fL (ref 78.0–100.0)
MONOS PCT: 8 % (ref 3–12)
MPV: 9.3 fL (ref 8.6–12.4)
Monocytes Absolute: 0.8 10*3/uL (ref 0.1–1.0)
Neutro Abs: 5.6 10*3/uL (ref 1.7–7.7)
Neutrophils Relative %: 57 % (ref 43–77)
Platelets: 622 10*3/uL — ABNORMAL HIGH (ref 150–400)
RBC: 3.08 MIL/uL — AB (ref 3.87–5.11)
RDW: 14.9 % (ref 11.5–15.5)
WBC: 9.9 10*3/uL (ref 4.0–10.5)

## 2015-08-13 LAB — URINALYSIS, COMPLETE
BACTERIA UA: NONE SEEN [HPF]
Bilirubin Urine: NEGATIVE
CASTS: NONE SEEN [LPF]
Crystals: NONE SEEN [HPF]
Glucose, UA: NEGATIVE
HGB URINE DIPSTICK: NEGATIVE
Ketones, ur: NEGATIVE
LEUKOCYTES UA: NEGATIVE
Nitrite: NEGATIVE
PROTEIN: NEGATIVE
RBC / HPF: NONE SEEN RBC/HPF (ref <=2)
SQUAMOUS EPITHELIAL / LPF: NONE SEEN [HPF] (ref <=5)
Specific Gravity, Urine: 1.012 (ref 1.001–1.035)
WBC, UA: NONE SEEN WBC/HPF (ref <=5)
YEAST: NONE SEEN [HPF]
pH: 5.5 (ref 5.0–8.0)

## 2015-08-13 LAB — VITAMIN D 25 HYDROXY (VIT D DEFICIENCY, FRACTURES): VIT D 25 HYDROXY: 14 ng/mL — AB (ref 30–100)

## 2015-08-14 LAB — HEMOGLOBINOPATHY EVALUATION
HGB A: 0 % — AB (ref 96.8–97.8)
HGB S QUANTITAION: 75.5 % — AB
Hemoglobin Other: 0 %
Hgb A2 Quant: 2.6 % (ref 2.2–3.2)
Hgb F Quant: 21.9 % — ABNORMAL HIGH (ref 0.0–2.0)

## 2015-08-20 ENCOUNTER — Telehealth: Payer: Self-pay | Admitting: *Deleted

## 2015-08-20 ENCOUNTER — Other Ambulatory Visit: Payer: Self-pay | Admitting: Internal Medicine

## 2015-08-20 DIAGNOSIS — D473 Essential (hemorrhagic) thrombocythemia: Secondary | ICD-10-CM

## 2015-08-20 DIAGNOSIS — E559 Vitamin D deficiency, unspecified: Secondary | ICD-10-CM

## 2015-08-20 DIAGNOSIS — D75839 Thrombocytosis, unspecified: Secondary | ICD-10-CM

## 2015-08-20 MED ORDER — VITAMIN D (ERGOCALCIFEROL) 1.25 MG (50000 UNIT) PO CAPS: 50000.0000 [IU] | ORAL_CAPSULE | ORAL | Status: DC

## 2015-08-20 MED ORDER — ASPIRIN EC 81 MG PO TBEC: 81.0000 mg | DELAYED_RELEASE_TABLET | Freq: Every day | ORAL | Status: DC

## 2015-08-20 NOTE — Telephone Encounter (Signed)
-----   Message from Tresa Garter, MD sent at 08/20/2015  4:32 PM EDT ----- Please inform patient that her laboratory results are mostly stable, HIV and hepatitis negative as of 08/12/2015. There is no evidence of iron deficiency. Vitamin D level is low. We will need to replace. Platelet count is slightly high, 622 but the morphology is normal. We will start her on low dose aspirin but no need for major intervention at this time. Vitamin D and Aspirin prescribed

## 2015-08-20 NOTE — Telephone Encounter (Signed)
Patient verified DOB Patient informed of lab results being mostly stable. Patient informed of HIV and Hepatitis being negative as of 08/12/15. Patient made aware of no evidence of iron deficiency, vitamin D being low. Patient also made aware of platelet count being slightly high but morphology being normal. Patient made aware of starting low dose of aspirin and no other intervention being needed at this time. Patient requested medications be sent to [facility] on [address]. Medical Assistant reordered medication and sent prescriptions to [facility].  Patient had no further questions and expressed her understanding.

## 2015-09-18 ENCOUNTER — Ambulatory Visit (INDEPENDENT_AMBULATORY_CARE_PROVIDER_SITE_OTHER): Payer: BLUE CROSS/BLUE SHIELD | Admitting: Family Medicine

## 2015-09-18 VITALS — BP 120/61 | HR 79 | Temp 98.1°F | Resp 16 | Ht 67.0 in | Wt 164.0 lb

## 2015-09-18 DIAGNOSIS — D571 Sickle-cell disease without crisis: Secondary | ICD-10-CM

## 2015-09-18 DIAGNOSIS — D473 Essential (hemorrhagic) thrombocythemia: Secondary | ICD-10-CM | POA: Diagnosis not present

## 2015-09-18 DIAGNOSIS — K219 Gastro-esophageal reflux disease without esophagitis: Secondary | ICD-10-CM | POA: Diagnosis not present

## 2015-09-18 DIAGNOSIS — D75839 Thrombocytosis, unspecified: Secondary | ICD-10-CM

## 2015-09-18 NOTE — Patient Instructions (Signed)
Food Choices for Gastroesophageal Reflux Disease, Adult When you have gastroesophageal reflux disease (GERD), the foods you eat and your eating habits are very important. Choosing the right foods can help ease your discomfort.  WHAT GUIDELINES DO I NEED TO FOLLOW?   Choose fruits, vegetables, whole grains, and low-fat dairy products.   Choose low-fat meat, fish, and poultry.  Limit fats such as oils, salad dressings, butter, nuts, and avocado.   Keep a food diary. This helps you identify foods that cause symptoms.   Avoid foods that cause symptoms. These may be different for everyone.   Eat small meals often instead of 3 large meals a day.   Eat your meals slowly, in a place where you are relaxed.   Limit fried foods.   Cook foods using methods other than frying.   Avoid drinking alcohol.   Avoid drinking large amounts of liquids with your meals.   Avoid bending over or lying down until 2-3 hours after eating.  WHAT FOODS ARE NOT RECOMMENDED?  These are some foods and drinks that may make your symptoms worse: Vegetables Tomatoes. Tomato juice. Tomato and spaghetti sauce. Chili peppers. Onion and garlic. Horseradish. Fruits Oranges, grapefruit, and lemon (fruit and juice). Meats High-fat meats, fish, and poultry. This includes hot dogs, ribs, ham, sausage, salami, and bacon. Dairy Whole milk and chocolate milk. Sour cream. Cream. Butter. Ice cream. Cream cheese.  Drinks Coffee and tea. Bubbly (carbonated) drinks or energy drinks. Condiments Hot sauce. Barbecue sauce.  Sweets/Desserts Chocolate and cocoa. Donuts. Peppermint and spearmint. Fats and Oils High-fat foods. This includes [place] fries and potato chips. Other Vinegar. Strong spices. This includes black pepper, white pepper, red pepper, cayenne, curry powder, cloves, ginger, and chili powder. The items listed above may not be a complete list of foods and drinks to avoid. Contact your dietitian for more  information.   This information is not intended to replace advice given to you by your health care provider. Make sure you discuss any questions you have with your health care provider.   Document Released: 04/04/2012 Document Revised: 10/25/2014 Document Reviewed: 08/08/2013 Elsevier Interactive Patient Education 2016 Elsevier Inc. Sickle Cell Anemia, Adult Sickle cell anemia is a condition in which red blood cells have an abnormal "sickle" shape. This abnormal shape shortens the cells' life span, which results in a lower than normal concentration of red blood cells in the blood. The sickle shape also causes the cells to clump together and block free blood flow through the blood vessels. As a result, the tissues and organs of the body do not receive enough oxygen. Sickle cell anemia causes organ damage and pain and increases the risk of infection. CAUSES  Sickle cell anemia is a genetic disorder. Those who receive two copies of the gene have the condition, and those who receive one copy have the trait. RISK FACTORS The sickle cell gene is most common in people whose families originated in [place]. Other areas of the globe where sickle cell trait occurs include the [place], [place] and [place], and the [place].  SIGNS AND SYMPTOMS  Pain, especially in the extremities, back, chest, or abdomen (common). The pain may start suddenly or may develop following an illness, especially if there is dehydration. Pain can also occur due to overexertion or exposure to extreme temperature changes.  Frequent severe bacterial infections, especially certain types of pneumonia and meningitis.  Pain and swelling in the hands and feet.  Decreased activity.   Loss of  appetite.   Change in behavior.  Headaches.  Seizures.  Shortness of breath or difficulty breathing.  Vision changes.  Skin ulcers. Those with the trait may not have symptoms or they may have mild symptoms.    DIAGNOSIS  Sickle cell anemia is diagnosed with blood tests that demonstrate the genetic trait. It is often diagnosed during the newborn period, due to mandatory testing nationwide. A variety of blood tests, X-rays, CT scans, MRI scans, ultrasounds, and lung function tests may also be done to monitor the condition. TREATMENT  Sickle cell anemia may be treated with:  Medicines. You may be given pain medicines, antibiotic medicines (to treat and prevent infections) or medicines to increase the production of certain types of hemoglobin.  Fluids.  Oxygen.  Blood transfusions. HOME CARE INSTRUCTIONS   Drink enough fluid to keep your urine clear or pale yellow. Increase your fluid intake in hot weather and during exercise.  Do not smoke. Smoking lowers oxygen levels in the blood.   Only take over-the-counter or prescription medicines for pain, fever, or discomfort as directed by your health care provider.  Take antibiotics as directed by your health care provider. Make sure you finish them it even if you start to feel better.   Take supplements as directed by your health care provider.   Consider wearing a medical alert bracelet. This tells anyone caring for you in an emergency of your condition.   When traveling, keep your medical information, health care provider's names, and the medicines you take with you at all times.   If you develop a fever, do not take medicines to reduce the fever right away. This could cover up a problem that is developing. Notify your health care provider.  Keep all follow-up appointments with your health care provider. Sickle cell anemia requires regular medical care. SEEK MEDICAL CARE IF: You have a fever. SEEK IMMEDIATE MEDICAL CARE IF:   You feel dizzy or faint.   You have new abdominal pain, especially on the left side near the stomach area.   You develop a persistent, often uncomfortable and painful penile erection (priapism). If this is  not treated immediately it will lead to impotence.   You have numbness your arms or legs or you have a hard time moving them.   You have a hard time with speech.   You have a fever or persistent symptoms for more than 2-3 days.   You have a fever and your symptoms suddenly get worse.   You have signs or symptoms of infection. These include:   Chills.   Abnormal tiredness (lethargy).   Irritability.   Poor eating.   Vomiting.   You develop pain that is not helped with medicine.   You develop shortness of breath.  You have pain in your chest.   You are coughing up pus-like or bloody sputum.   You develop a stiff neck.  Your feet or hands swell or have pain.  Your abdomen appears bloated.  You develop joint pain. MAKE SURE YOU:  Understand these instructions.   This information is not intended to replace advice given to you by your health care provider. Make sure you discuss any questions you have with your health care provider.   Document Released: 01/12/2006 Document Revised: 10/25/2014 Document Reviewed: 05/16/2013 Elsevier Interactive Patient Education [employer].

## 2015-09-18 NOTE — Progress Notes (Signed)
Subjective:    Patient ID: Krista [name], female    DOB: 1986/06/11, 29 y.o.   MRN: [id number]  HPI [name], a 29 year old patient with a history of sickle cell anemia, HbSS presents for a 1 month follow up of sickle cell anemia and epigastric pain. Patient reports that she is doing well and taking medications consistently. Patient reports that she typically takes Ibuprofen for increased pain intensity. She said her sickle cell disease has been well managed, she is only on NSAID over-the-counter when necessary for pain. Patient continues to refrain from narcotic medications.  She was started on folic acid 1 month ago.  Patient was started on a trial of Protonix 40 mg 1 month ago for upper epigastric pain. She maintains that symptoms have improved since that time.  She denies belching, choking on food, cough, difficulty swallowing, dysphagia, early satiety, laryngitis, melena, midespigastric pain, nausea and wheezing.  She denies dysphagia.  She has not lost weight. She denies melena, hematochezia, hematemesis, and coffee ground emesis. Past Medical History  Diagnosis Date  . Sickle cell anemia (HCC)    Immunization History  Administered Date(s) Administered  . Influenza,inj,Quad PF,36+ Mos 08/12/2015  . Pneumococcal Polysaccharide-23 08/12/2015   Social History   Social History  . Marital Status: Married    Spouse Name: N/A  . Number of Children: N/A  . Years of Education: N/A   Occupational History  . Not on file.   Social History Main Topics  . Smoking status: Never Smoker   . Smokeless tobacco: Not on file  . Alcohol Use: Yes     Comment: socially  . Drug Use: No  . Sexual Activity: Yes    Birth Control/ Protection: Implant   Other Topics Concern  . Not on file   Social History Narrative  . No narrative on file    Review of Systems  Constitutional: Negative for fever and fatigue.  HENT: Negative.   Eyes: Negative.   Respiratory: Negative.    Cardiovascular: Negative.   Gastrointestinal: Negative.   Endocrine: Negative.   Genitourinary: Negative.   Musculoskeletal: Negative.   Skin: Negative.   Neurological: Negative.   Hematological: Negative.   Psychiatric/Behavioral: Negative.  Negative for sleep disturbance.       Objective:   Physical Exam  Constitutional: She is oriented to person, place, and time. She appears well-developed and well-nourished.  HENT:  Head: Normocephalic and atraumatic.  Right Ear: External ear normal.  Left Ear: External ear normal.  Mouth/Throat: Oropharynx is clear and moist.  Eyes: Conjunctivae and EOM are normal. Pupils are equal, round, and reactive to light.  Neck: Normal range of motion. Neck supple.  Cardiovascular: Normal rate, regular rhythm, normal heart sounds and intact distal pulses.   Pulmonary/Chest: Effort normal and breath sounds normal.  Abdominal: Soft. Bowel sounds are normal.  Musculoskeletal: Normal range of motion.  Neurological: She is alert and oriented to person, place, and time. She has normal reflexes.  Skin: Skin is warm and dry.  Psychiatric: She has a normal mood and affect. Her behavior is normal. Judgment and thought content normal.      BP 120/61 mmHg  Pulse 79  Temp(Src) 98.1 F (36.7 C) (Oral)  Resp 16  Ht 5\' 7"  (1.702 m)  Wt 164 lb (74.39 kg)  BMI 25.68 kg/m2 Assessment & Plan:  1. Hb-SS disease without crisis ([place]) Will continue  folic acid 1 mg daily to prevent aplastic bone marrow crises.   Pulmonary  evaluation - Patient denies severe recurrent wheezes, shortness of breath with exercise, or persistent cough. If these symptoms develop, pulmonary function tests with spirometry will be ordered, and if abnormal, plan on referral to Pulmonology for further evaluation.  Cardiac - Routine screening for pulmonary hypertension is not recommended.  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 She is to follow up with  opthalmologist as scheduled.   Immunization status - Patient is up to date with immunizations  Acute and chronic painful episodes - Patient does not take opiate medications for     - Iron overload from chronic transfusion. Will check ferritin levels. She has received blood transfusions in the past.   - CBC with Differential - Reticulocytes  2. Gastroesophageal reflux disease without esophagitis Symptoms of GERD are controlled on current medication regimen.   3. Thrombocytosis ([place]) Continue medication therapy as previously prescribed.  - CBC with Differential   RTC: Will follow up by phone with laboratory results. RTC in 3 months for SSA or as needed    Dorena Dew, FNP

## 2015-09-19 ENCOUNTER — Telehealth: Payer: Self-pay

## 2015-09-19 ENCOUNTER — Encounter: Payer: Self-pay | Admitting: Family Medicine

## 2015-09-19 ENCOUNTER — Other Ambulatory Visit: Payer: Self-pay | Admitting: Family Medicine

## 2015-09-19 DIAGNOSIS — D75839 Thrombocytosis, unspecified: Secondary | ICD-10-CM

## 2015-09-19 DIAGNOSIS — D473 Essential (hemorrhagic) thrombocythemia: Secondary | ICD-10-CM

## 2015-09-19 LAB — CBC WITH DIFFERENTIAL/PLATELET
BASOS PCT: 1 % (ref 0–1)
Basophils Absolute: 0.1 10*3/uL (ref 0.0–0.1)
EOS ABS: 0.1 10*3/uL (ref 0.0–0.7)
Eosinophils Relative: 1 % (ref 0–5)
HEMATOCRIT: 27.3 % — AB (ref 36.0–46.0)
Hemoglobin: 9.3 g/dL — ABNORMAL LOW (ref 12.0–15.0)
Lymphocytes Relative: 29 % (ref 12–46)
Lymphs Abs: 3.3 10*3/uL (ref 0.7–4.0)
MCH: 31 pg (ref 26.0–34.0)
MCHC: 34.1 g/dL (ref 30.0–36.0)
MCV: 91 fL (ref 78.0–100.0)
MPV: 9.3 fL (ref 8.6–12.4)
Monocytes Absolute: 0.8 10*3/uL (ref 0.1–1.0)
Monocytes Relative: 7 % (ref 3–12)
NEUTROS ABS: 7.1 10*3/uL (ref 1.7–7.7)
NEUTROS PCT: 62 % (ref 43–77)
Platelets: 637 10*3/uL — ABNORMAL HIGH (ref 150–400)
RBC: 3 MIL/uL — ABNORMAL LOW (ref 3.87–5.11)
RDW: 15.9 % — AB (ref 11.5–15.5)
WBC: 11.4 10*3/uL — AB (ref 4.0–10.5)

## 2015-09-19 LAB — RETICULOCYTES
ABS RETIC: 390 10*3/uL — AB (ref 19.0–186.0)
RBC.: 3 MIL/uL — ABNORMAL LOW (ref 3.87–5.11)
Retic Ct Pct: 13 % — ABNORMAL HIGH (ref 0.4–2.3)

## 2015-09-19 NOTE — Telephone Encounter (Signed)
-----   Message from Dorena Dew, [place] sent at 09/19/2015  6:40 AM EST ----- Please inform Ms. Leaf that platelet count remains elevated. Will continue daily aspirin therapy. Patient is to follow up for labs in 3 months. However, she can follow up as scheduled.   Thanks ----- Message -----    From: [facility]    Sent: 09/18/2015   8:29 PM      To: Dorena Dew, FNP

## 2015-09-19 NOTE — Telephone Encounter (Signed)
Spoke with patient advised of elevated platelet count and to continue Asprin therapy as instructed. Advised to follow up in 3 months for labs and to keep next scheduled appointment. Patient verbalized understanding and had no further questions at this time. Thanks!

## 2016-03-19 ENCOUNTER — Ambulatory Visit: Payer: BLUE CROSS/BLUE SHIELD | Admitting: Family Medicine

## 2016-04-14 ENCOUNTER — Ambulatory Visit (INDEPENDENT_AMBULATORY_CARE_PROVIDER_SITE_OTHER): Payer: BLUE CROSS/BLUE SHIELD | Admitting: Family Medicine

## 2016-04-14 ENCOUNTER — Encounter: Payer: Self-pay | Admitting: Family Medicine

## 2016-04-14 VITALS — BP 116/64 | HR 86 | Temp 98.1°F | Resp 14 | Ht 67.0 in | Wt 167.0 lb

## 2016-04-14 DIAGNOSIS — E559 Vitamin D deficiency, unspecified: Secondary | ICD-10-CM

## 2016-04-14 DIAGNOSIS — D571 Sickle-cell disease without crisis: Secondary | ICD-10-CM

## 2016-04-14 LAB — POCT URINALYSIS DIP (DEVICE)
Bilirubin Urine: NEGATIVE
GLUCOSE, UA: NEGATIVE mg/dL
Hgb urine dipstick: NEGATIVE
KETONES UR: NEGATIVE mg/dL
Leukocytes, UA: NEGATIVE
NITRITE: NEGATIVE
PROTEIN: NEGATIVE mg/dL
Specific Gravity, Urine: 1.015 (ref 1.005–1.030)
UROBILINOGEN UA: 1 mg/dL (ref 0.0–1.0)
pH: 5.5 (ref 5.0–8.0)

## 2016-04-14 LAB — CBC WITH DIFFERENTIAL/PLATELET
BASOS PCT: 1 %
Basophils Absolute: 117 cells/uL (ref 0–200)
Eosinophils Absolute: 117 cells/uL (ref 15–500)
Eosinophils Relative: 1 %
HCT: 27.9 % — ABNORMAL LOW (ref 35.0–45.0)
Hemoglobin: 9.3 g/dL — ABNORMAL LOW (ref 11.7–15.5)
LYMPHS PCT: 22 %
Lymphs Abs: 2574 cells/uL (ref 850–3900)
MCH: 30.8 pg (ref 27.0–33.0)
MCHC: 33.3 g/dL (ref 32.0–36.0)
MCV: 92.4 fL (ref 80.0–100.0)
MONOS PCT: 8 %
MPV: 9 fL (ref 7.5–12.5)
Monocytes Absolute: 936 cells/uL (ref 200–950)
NEUTROS PCT: 68 %
Neutro Abs: 7956 cells/uL — ABNORMAL HIGH (ref 1500–7800)
Platelets: 614 10*3/uL — ABNORMAL HIGH (ref 140–400)
RBC: 3.02 MIL/uL — AB (ref 3.80–5.10)
RDW: 15.8 % — AB (ref 11.0–15.0)
WBC: 11.7 10*3/uL — AB (ref 3.8–10.8)

## 2016-04-14 LAB — RETICULOCYTES
ABS Retic: 492260 cells/uL — ABNORMAL HIGH (ref 20000–80000)
RBC.: 3.02 MIL/uL — ABNORMAL LOW (ref 3.80–5.10)
Retic Ct Pct: 16.3 %

## 2016-04-14 NOTE — Patient Instructions (Signed)
Sickle Cell Anemia, Adult Sickle cell anemia is a condition in which red blood cells have an abnormal "sickle" shape. This abnormal shape shortens the cells' life span, which results in a lower than normal concentration of red blood cells in the blood. The sickle shape also causes the cells to clump together and block free blood flow through the blood vessels. As a result, the tissues and organs of the body do not receive enough oxygen. Sickle cell anemia causes organ damage and pain and increases the risk of infection. CAUSES  Sickle cell anemia is a genetic disorder. Those who receive two copies of the gene have the condition, and those who receive one copy have the trait. RISK FACTORS The sickle cell gene is most common in people whose families originated in [place]. Other areas of the globe where sickle cell trait occurs include the [place], [place] and [place], the [place], and the [place].  SIGNS AND SYMPTOMS  Pain, especially in the extremities, back, chest, or abdomen (common). The pain may start suddenly or may develop following an illness, especially if there is dehydration. Pain can also occur due to overexertion or exposure to extreme temperature changes.  Frequent severe bacterial infections, especially certain types of pneumonia and meningitis.  Pain and swelling in the hands and feet.  Decreased activity.   Loss of appetite.   Change in behavior.  Headaches.  Seizures.  Shortness of breath or difficulty breathing.  Vision changes.  Skin ulcers. Those with the trait may not have symptoms or they may have mild symptoms.  DIAGNOSIS  Sickle cell anemia is diagnosed with blood tests that demonstrate the genetic trait. It is often diagnosed during the newborn period, due to mandatory testing nationwide. A variety of blood tests, X-rays, CT scans, MRI scans, ultrasounds, and lung function tests may also be done to monitor the condition. TREATMENT  Sickle  cell anemia may be treated with:  Medicines. You may be given pain medicines, antibiotic medicines (to treat and prevent infections) or medicines to increase the production of certain types of hemoglobin.  Fluids.  Oxygen.  Blood transfusions. HOME CARE INSTRUCTIONS   Drink enough fluid to keep your urine clear or pale yellow. Increase your fluid intake in hot weather and during exercise.  Do not smoke. Smoking lowers oxygen levels in the blood.   Only take over-the-counter or prescription medicines for pain, fever, or discomfort as directed by your health care provider.  Take antibiotics as directed by your health care provider. Make sure you finish them it even if you start to feel better.   Take supplements as directed by your health care provider.   Consider wearing a medical alert bracelet. This tells anyone caring for you in an emergency of your condition.   When traveling, keep your medical information, health care provider's names, and the medicines you take with you at all times.   If you develop a fever, do not take medicines to reduce the fever right away. This could cover up a problem that is developing. Notify your health care provider.  Keep all follow-up appointments with your health care provider. Sickle cell anemia requires regular medical care. SEEK MEDICAL CARE IF: You have a fever. SEEK IMMEDIATE MEDICAL CARE IF:   You feel dizzy or faint.   You have new abdominal pain, especially on the left side near the stomach area.   You develop a persistent, often uncomfortable and painful penile erection (priapism). If this is not treated immediately it   will lead to impotence.   You have numbness your arms or legs or you have a hard time moving them.   You have a hard time with speech.   You have a fever or persistent symptoms for more than 2-3 days.   You have a fever and your symptoms suddenly get worse.   You have signs or symptoms of infection.  These include:   Chills.   Abnormal tiredness (lethargy).   Irritability.   Poor eating.   Vomiting.   You develop pain that is not helped with medicine.   You develop shortness of breath.  You have pain in your chest.   You are coughing up pus-like or bloody sputum.   You develop a stiff neck.  Your feet or hands swell or have pain.  Your abdomen appears bloated.  You develop joint pain. MAKE SURE YOU:  Understand these instructions.   This information is not intended to replace advice given to you by your health care provider. Make sure you discuss any questions you have with your health care provider.   Document Released: 01/12/2006 Document Revised: 10/25/2014 Document Reviewed: 05/16/2013 Elsevier Interactive Patient Education 2016 [employer]  

## 2016-04-14 NOTE — Progress Notes (Signed)
Subjective:    Patient ID: Krista [name], female    DOB: 1986/07/25, 30 y.o.   MRN: [id number]  HPI [name], a 30 year old patient with a history of sickle cell anemia, HbSS presents for a 6 month follow up of sickle cell anemia. Patient reports that she is doing well and taking medications consistently. Patient reports that she typically takes Ibuprofen for increased pain intensity. She said her sickle cell disease has been well managed Patient continues to refrain from narcotic medications.  She says that she has been taking folic acid consistently. She denies headache, shortness of breath, chest pains, dysuria, nausea, vomiting, or diarrhea.   Past Medical History  Diagnosis Date  . Sickle cell anemia (HCC)    Immunization History  Administered Date(s) Administered  . Influenza,inj,Quad PF,36+ Mos 08/12/2015  . Pneumococcal Polysaccharide-23 08/12/2015   Social History   Social History  . Marital Status: Married    Spouse Name: N/A  . Number of Children: N/A  . Years of Education: N/A   Occupational History  . Not on file.   Social History Main Topics  . Smoking status: Never Smoker   . Smokeless tobacco: Not on file  . Alcohol Use: Yes     Comment: socially  . Drug Use: No  . Sexual Activity: Yes    Birth Control/ Protection: Implant   Other Topics Concern  . Not on file   Social History Narrative    Review of Systems  Constitutional: Negative for fever and fatigue.  HENT: Negative.   Eyes: Negative.   Respiratory: Negative.   Cardiovascular: Negative.  Negative for chest pain, palpitations and leg swelling.  Gastrointestinal: Negative.   Endocrine: Negative.  Negative for cold intolerance, heat intolerance, polydipsia, polyphagia and polyuria.  Genitourinary: Negative.   Musculoskeletal: Negative.   Skin: Negative.   Allergic/Immunologic: Negative for food allergies and immunocompromised state.  Neurological: Negative.   Hematological:  Negative.   Psychiatric/Behavioral: Negative.  Negative for sleep disturbance.       Objective:   Physical Exam  Constitutional: She is oriented to person, place, and time. She appears well-developed and well-nourished.  HENT:  Head: Normocephalic and atraumatic.  Right Ear: External ear normal.  Left Ear: External ear normal.  Mouth/Throat: Oropharynx is clear and moist.  Eyes: Conjunctivae and EOM are normal. Pupils are equal, round, and reactive to light.  Neck: Normal range of motion. Neck supple.  Cardiovascular: Normal rate, regular rhythm and intact distal pulses.   Murmur heard.  Systolic murmur is present with a grade of 2/6  Pulmonary/Chest: Effort normal and breath sounds normal.  Abdominal: Soft. Bowel sounds are normal.  Musculoskeletal: Normal range of motion.  Neurological: She is alert and oriented to person, place, and time. She has normal reflexes.  Skin: Skin is warm and dry.  Psychiatric: She has a normal mood and affect. Her behavior is normal. Judgment and thought content normal.      BP 116/64 mmHg  Pulse 86  Temp(Src) 98.1 F (36.7 C) (Oral)  Resp 14  Ht 5\' 7"  (1.702 m)  Wt 167 lb (75.751 kg)  BMI 26.15 kg/m2  SpO2 100%  LMP 03/16/2016 Assessment & Plan:  1. Hb-SS disease without crisis ([place]) Will continue  folic acid 1 mg daily to prevent aplastic bone marrow crises.   Pulmonary evaluation - Patient denies severe recurrent wheezes, shortness of breath with exercise, or persistent cough. If these symptoms develop, pulmonary function tests with spirometry will be  ordered, and if abnormal, plan on referral to Pulmonology for further evaluation.  Cardiac - Routine screening for pulmonary hypertension is not recommended.  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 Eye examination 1 month ago  Immunization status - Patient is up to date with immunizations  Acute and chronic painful episodes - Patient does not take opiate  medications for     - Iron overload from chronic transfusion. Will check ferritin levels. She has received blood transfusions in the past.   - COMPLETE METABOLIC PANEL WITH GFR - CBC with Differential - Reticulocytes - Urinalysis Dipstick    2. Vitamin D deficiency - Vitamin D, 25-hydroxy    Routine Health Maintenance:  Pap smear 1 year ago (normal per patient) Patient is up to date with immunizations Recommend a lowfat, low carbohydrate diet divided over 5-6 small meals, increase water intake to 6-8 glasses, and 150 minutes per week of cardiovascular exercise.    RTC: Will follow up by phone with laboratory results. RTC in 6 months for SSA or as needed    Dorena Dew, FNP

## 2016-04-15 ENCOUNTER — Other Ambulatory Visit: Payer: Self-pay | Admitting: Family Medicine

## 2016-04-15 DIAGNOSIS — E559 Vitamin D deficiency, unspecified: Secondary | ICD-10-CM

## 2016-04-15 DIAGNOSIS — D75839 Thrombocytosis, unspecified: Secondary | ICD-10-CM

## 2016-04-15 DIAGNOSIS — D473 Essential (hemorrhagic) thrombocythemia: Secondary | ICD-10-CM

## 2016-04-15 LAB — VITAMIN D 25 HYDROXY (VIT D DEFICIENCY, FRACTURES): Vit D, 25-Hydroxy: 19 ng/mL — ABNORMAL LOW (ref 30–100)

## 2016-04-15 LAB — COMPLETE METABOLIC PANEL WITH GFR
ALBUMIN: 4.6 g/dL (ref 3.6–5.1)
ALT: 20 U/L (ref 6–29)
AST: 19 U/L (ref 10–30)
Alkaline Phosphatase: 72 U/L (ref 33–115)
BILIRUBIN TOTAL: 2.2 mg/dL — AB (ref 0.2–1.2)
BUN: 6 mg/dL — ABNORMAL LOW (ref 7–25)
CALCIUM: 9.2 mg/dL (ref 8.6–10.2)
CO2: 21 mmol/L (ref 20–31)
CREATININE: 0.47 mg/dL — AB (ref 0.50–1.10)
Chloride: 105 mmol/L (ref 98–110)
GFR, Est Non African American: >89 mL/min (ref >=60)
Glucose, Bld: 81 mg/dL (ref 65–99)
Potassium: 4.5 mmol/L (ref 3.5–5.3)
Sodium: 138 mmol/L (ref 135–146)
TOTAL PROTEIN: 7.3 g/dL (ref 6.1–8.1)

## 2016-04-15 MED ORDER — ASPIRIN EC 81 MG PO TBEC: 81.0000 mg | DELAYED_RELEASE_TABLET | Freq: Every day | ORAL | Status: DC

## 2016-04-15 MED ORDER — VITAMIN D (ERGOCALCIFEROL) 1.25 MG (50000 UNIT) PO CAPS: 50000.0000 [IU] | ORAL_CAPSULE | ORAL | Status: DC

## 2016-04-15 NOTE — Progress Notes (Signed)
Called and spoke with patient, advised of labs and the need to take vitamin D once weekly as prescribed and the need to continue Asprin 81mg  daily. Patient verbalized understanding and will follow instructions. Thanks!

## 2016-08-30 ENCOUNTER — Other Ambulatory Visit: Payer: Self-pay

## 2016-08-30 DIAGNOSIS — D571 Sickle-cell disease without crisis: Secondary | ICD-10-CM

## 2016-08-30 MED ORDER — FOLIC ACID 1 MG PO TABS: 1.0000 mg | ORAL_TABLET | Freq: Every day | ORAL | 3 refills | Status: DC

## 2016-08-30 NOTE — Telephone Encounter (Signed)
Refill for folic acid 1mg  sent into pharmacy. Thanks!

## 2016-10-15 ENCOUNTER — Ambulatory Visit (INDEPENDENT_AMBULATORY_CARE_PROVIDER_SITE_OTHER): Payer: BLUE CROSS/BLUE SHIELD | Admitting: Family Medicine

## 2016-10-15 ENCOUNTER — Encounter: Payer: Self-pay | Admitting: Family Medicine

## 2016-10-15 ENCOUNTER — Encounter (INDEPENDENT_AMBULATORY_CARE_PROVIDER_SITE_OTHER): Payer: Self-pay

## 2016-10-15 VITALS — BP 129/75 | HR 89 | Temp 98.7°F | Resp 16 | Ht 67.0 in | Wt 168.0 lb

## 2016-10-15 DIAGNOSIS — Z23 Encounter for immunization: Secondary | ICD-10-CM

## 2016-10-15 DIAGNOSIS — L209 Atopic dermatitis, unspecified: Secondary | ICD-10-CM

## 2016-10-15 DIAGNOSIS — E559 Vitamin D deficiency, unspecified: Secondary | ICD-10-CM

## 2016-10-15 DIAGNOSIS — D571 Sickle-cell disease without crisis: Secondary | ICD-10-CM | POA: Diagnosis not present

## 2016-10-15 LAB — CBC WITH DIFFERENTIAL/PLATELET
BASOS PCT: 1 %
Basophils Absolute: 150 cells/uL (ref 0–200)
EOS ABS: 150 {cells}/uL (ref 15–500)
Eosinophils Relative: 1 %
HCT: 29 % — ABNORMAL LOW (ref 35.0–45.0)
Hemoglobin: 9.6 g/dL — ABNORMAL LOW (ref 11.7–15.5)
Lymphocytes Relative: 24 %
Lymphs Abs: 3600 cells/uL (ref 850–3900)
MCH: 30.8 pg (ref 27.0–33.0)
MCHC: 33.1 g/dL (ref 32.0–36.0)
MCV: 92.9 fL (ref 80.0–100.0)
MONO ABS: 1050 {cells}/uL — AB (ref 200–950)
MONOS PCT: 7 %
MPV: 8.8 fL (ref 7.5–12.5)
NEUTROS ABS: 10050 {cells}/uL — AB (ref 1500–7800)
Neutrophils Relative %: 67 %
PLATELETS: 670 10*3/uL — AB (ref 140–400)
RBC: 3.12 MIL/uL — ABNORMAL LOW (ref 3.80–5.10)
RDW: 16.3 % — ABNORMAL HIGH (ref 11.0–15.0)
WBC: 15 10*3/uL — ABNORMAL HIGH (ref 3.8–10.8)

## 2016-10-15 LAB — POCT URINALYSIS DIP (DEVICE)
Bilirubin Urine: NEGATIVE
Glucose, UA: NEGATIVE mg/dL
HGB URINE DIPSTICK: NEGATIVE
Ketones, ur: NEGATIVE mg/dL
Leukocytes, UA: NEGATIVE
NITRITE: NEGATIVE
PH: 5.5 (ref 5.0–8.0)
PROTEIN: NEGATIVE mg/dL
Specific Gravity, Urine: 1.015 (ref 1.005–1.030)
Urobilinogen, UA: 0.2 mg/dL (ref 0.0–1.0)

## 2016-10-15 LAB — RETICULOCYTES
ABS Retic: 508560 cells/uL — ABNORMAL HIGH (ref 20000–80000)
RBC.: 3.12 MIL/uL — ABNORMAL LOW (ref 3.80–5.10)
Retic Ct Pct: 16.3 %

## 2016-10-15 MED ORDER — IBUPROFEN 800 MG PO TABS: 800.0000 mg | ORAL_TABLET | Freq: Three times a day (TID) | ORAL | 3 refills | Status: DC | PRN

## 2016-10-15 MED ORDER — MOMETASONE FUROATE 0.1 % EX CREA: 1.0000 "application " | TOPICAL_CREAM | Freq: Every day | CUTANEOUS | 0 refills | Status: DC

## 2016-10-15 NOTE — Progress Notes (Signed)
Subjective:    Patient ID: Krista [name], female    DOB: [birth year], 30 y.o.   MRN: [id number]  HPI [name], a 30 year old patient with a history of sickle cell anemia, HbSS presents for a 6 month follow up of sickle cell anemia. Patient reports that she is doing well and taking medications consistently. Patient reports that she typically takes Ibuprofen for increased pain intensity. She said her sickle cell disease has been well managed. She has periodic bilateral knee pain that is relieved with rest and increased hydration.  Patient continues to refrain from narcotic medications.  She says that she has been taking folic acid consistently. She denies headache, shortness of breath, chest pains, dysuria, nausea, vomiting, or diarrhea.   Past Medical History:  Diagnosis Date  . Sickle cell anemia (HCC)    Immunization History  Administered Date(s) Administered  . Influenza,inj,Quad PF,36+ Mos 08/12/2015  . Pneumococcal Polysaccharide-23 08/12/2015   Social History   Social History  . Marital status: Married    Spouse name: N/A  . Number of children: N/A  . Years of education: N/A   Occupational History  . Not on file.   Social History Main Topics  . Smoking status: Never Smoker  . Smokeless tobacco: Never Used  . Alcohol use Yes     Comment: socially  . Drug use: No  . Sexual activity: Yes    Birth control/ protection: Implant   Other Topics Concern  . Not on file   Social History Narrative  . No narrative on file    Review of Systems  Constitutional: Negative for fatigue and fever.  HENT: Negative.   Eyes: Negative.   Respiratory: Negative.   Cardiovascular: Negative.  Negative for chest pain, palpitations and leg swelling.  Gastrointestinal: Negative.   Endocrine: Negative.  Negative for cold intolerance, heat intolerance, polydipsia, polyphagia and polyuria.  Genitourinary: Negative.   Musculoskeletal: Negative.   Skin: Negative.    Allergic/Immunologic: Negative for food allergies and immunocompromised state.  Neurological: Negative.   Hematological: Negative.   Psychiatric/Behavioral: Negative.  Negative for sleep disturbance.       Objective:   Physical Exam  Constitutional: She is oriented to person, place, and time. She appears well-developed and well-nourished.  HENT:  Head: Normocephalic and atraumatic.  Right Ear: External ear normal.  Left Ear: External ear normal.  Mouth/Throat: Oropharynx is clear and moist.  Eyes: Conjunctivae and EOM are normal. Pupils are equal, round, and reactive to light.  Neck: Normal range of motion. Neck supple.  Cardiovascular: Normal rate, regular rhythm and intact distal pulses.   Murmur heard.  Systolic murmur is present with a grade of 2/6  Pulmonary/Chest: Effort normal and breath sounds normal.  Abdominal: Soft. Bowel sounds are normal.  Musculoskeletal: Normal range of motion.  Neurological: She is alert and oriented to person, place, and time. She has normal reflexes.  Skin: Skin is warm and dry.  Psychiatric: She has a normal mood and affect. Her behavior is normal. Judgment and thought content normal.      BP 129/75 (BP Location: Right Arm, Patient Position: Sitting, Cuff Size: Normal)   Pulse 89   Temp 98.7 F (37.1 C) (Oral)   Resp 16   Ht 5\' 7"  (1.702 m)   Wt 168 lb (76.2 kg)   LMP 09/20/2016   SpO2 100%   BMI 26.31 kg/m  Assessment & Plan:  1. Hb-SS disease without crisis ([place]) Will continue  folic acid 1  mg daily to prevent aplastic bone marrow crises.   Pulmonary evaluation - Patient denies severe recurrent wheezes, shortness of breath with exercise, or persistent cough. If these symptoms develop, pulmonary function tests with spirometry will be ordered, and if abnormal, plan on referral to Pulmonology for further evaluation.  Cardiac - Routine screening for pulmonary hypertension is not recommended.  Eye - Patient is under the care of retina  specialist for vasculitis. Eye examination 1 month ago  Immunization status - Patient is up to date with immunizations  Acute and chronic painful episodes - Patient does not take opiate medications for     - Iron overload from chronic transfusion. Will check ferritin levels. She has received blood transfusions in the past.  - CBC with Differential - COMPLETE METABOLIC PANEL WITH GFR - Reticulocytes - Lactate Dehydrogenase - ibuprofen (ADVIL,MOTRIN) 800 MG tablet; Take 1 tablet (800 mg total) by mouth every 8 (eight) hours as needed.  Dispense: 60 tablet; Refill: 3  2. Vitamin D deficiency  - Vitamin D, 25-hydroxy  3. Atopic dermatitis, unspecified type  - mometasone (ELOCON) 0.1 % cream; Apply 1 application topically daily.  Dispense: 45 g; Refill: 0  4. Need for immunization against influenza  - Flu Vaccine QUAD 36+ mos IM (Fluarix)  Routine Health Maintenance:  Pap smear 1 year ago (normal per patient). Patient is to follow up with Dr. Landry Mellow for implanon removal. Patient will start trying to conceive in 2018. Recommend genetic testing for her spouse.  Patient is up to date with immunizations Recommend a lowfat, low carbohydrate diet divided over 5-6 small meals, increase water intake to 6-8 glasses, and 150 minutes per week of cardiovascular exercise.    RTC: Will follow up by phone with laboratory results. RTC in 6 months for SSA or as needed    Dorena Dew, FNP

## 2016-10-15 NOTE — Patient Instructions (Addendum)
Consult with gynecologist about removing Nexplanon/Implanon Have husband establish care for genetic testing (sickle cell anemia) Take medication consistently.  Follow-up by phone with laboratory results.  Continue to rest, stay warm, and increase water intake to 64 ounces per day to avoid crisis. 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   Follow up in 3 months for sickle cell anemia.  Sickle Cell Anemia, Adult Sickle cell anemia is a condition where your red blood cells are shaped like sickles. Red blood cells carry oxygen through the body. Sickle-shaped red blood cells do not live as long as normal red blood cells. They also clump together and block blood from flowing through the blood vessels. These things prevent the body from getting enough oxygen. Sickle cell anemia causes organ damage and pain. It also increases the risk of infection. Follow these instructions at home:  Drink enough fluid to keep your pee (urine) clear or pale yellow. Drink more in hot weather and during exercise.  Do not smoke. Smoking lowers oxygen levels in the blood.  Only take over-the-counter or prescription medicines as told by your doctor.  Take antibiotic medicines as told by your doctor. Make sure you finish them even if you start to feel better.  Take supplements as told by your doctor.  Consider wearing a medical alert bracelet. This tells anyone caring for you in an emergency of your condition.  When traveling, keep your medical information, doctors' names, and the medicines you take with you at all times.  If you have a fever, do not take fever medicines right away. This could cover up a problem. Tell your doctor.  Keep all follow-up visits with your doctor. Sickle cell anemia requires regular medical care. Contact a doctor if: You have a fever. Get help right away if:  You feel dizzy or faint.  You have new belly (abdominal) pain, especially on the left side  near the stomach area.  You have a lasting, often uncomfortable and painful erection of the penis (priapism). If it is not treated right away, you will become unable to have sex (impotence).  You have numbness in your arms or legs or you have a hard time moving them.  You have a hard time talking.  You have a fever or lasting symptoms for more than 2-3 days.  You have a fever and your symptoms suddenly get worse.  You have signs or symptoms of infection. These include:  Chills.  Being more tired than normal (lethargy).  Irritability.  Poor eating.  Throwing up (vomiting).  You have pain that is not helped with medicine.  You have shortness of breath.  You have pain in your chest.  You are coughing up pus-like or bloody mucus.  You have a stiff neck.  Your feet or hands swell or have pain.  Your belly looks bloated.  Your joints hurt. This information is not intended to replace advice given to you by your health care provider. Make sure you discuss any questions you have with your health care provider. Document Released: 07/25/2013 Document Revised: 03/11/2016 Document Reviewed: 05/16/2013 Elsevier Interactive Patient Education  2017 [place].  Prenatal Care WHAT IS PRENATAL CARE? Prenatal care is the process of caring for a pregnant woman before she gives birth. Prenatal care makes sure that she and her baby remain as healthy as possible throughout pregnancy. Prenatal care may be provided by a midwife, family practice health care provider, or a childbirth and pregnancy specialist (obstetrician). Prenatal  care may include physical examinations, testing, treatments, and education on nutrition, lifestyle, and social support services. WHY IS PRENATAL CARE SO IMPORTANT? Early and consistent prenatal care increases the chance that you and your baby will remain healthy throughout your pregnancy. This type of care also decreases a baby's risk of being born too early  (prematurely), or being born smaller than expected (small for gestational age). Any underlying medical conditions you may have that could pose a risk during your pregnancy are discussed during prenatal care visits. You will also be monitored regularly for any new conditions that may arise during your pregnancy so they can be treated quickly and effectively. WHAT HAPPENS DURING PRENATAL CARE VISITS? Prenatal care visits may include the following: Discussion Tell your health care provider about any new signs or symptoms you have experienced since your last visit. These might include:  Nausea or vomiting.  Increased or decreased level of energy.  Difficulty sleeping.  Back or leg pain.  Weight changes.  Frequent urination.  Shortness of breath with physical activity.  Changes in your skin, such as the development of a rash or itchiness.  Vaginal discharge or bleeding.  Feelings of excitement or nervousness.  Changes in your baby's movements. You may want to write down any questions or topics you want to discuss with your health care provider and bring them with you to your appointment. Examination During your first prenatal care visit, you will likely have a complete physical exam. Your health care provider will often examine your vagina, cervix, and the position of your uterus, as well as check your heart, lungs, and other body systems. As your pregnancy progresses, your health care provider will measure the size of your uterus and your baby's position inside your uterus. He or she may also examine you for early signs of labor. Your prenatal visits may also include checking your blood pressure and, after about 10-12 weeks of pregnancy, listening to your baby's heartbeat. Testing Regular testing often includes:  Urinalysis. This checks your urine for glucose, protein, or signs of infection.  Blood count. This checks the levels of white and red blood cells in your body.  Tests for  sexually transmitted infections (STIs). Testing for STIs at the beginning of pregnancy is routinely done and is required in many states.  Antibody testing. You will be checked to see if you are immune to certain illnesses, such as rubella, that can affect a developing fetus.  Glucose screen. Around 24-28 weeks of pregnancy, your blood glucose level will be checked for signs of gestational diabetes. Follow-up tests may be recommended.  Group B strep. This is a bacteria that is commonly found inside a woman's vagina. This test will inform your health care provider if you need an antibiotic to reduce the amount of this bacteria in your body prior to labor and childbirth.  Ultrasound. Many pregnant women undergo an ultrasound screening around 18-20 weeks of pregnancy to evaluate the health of the fetus and check for any developmental abnormalities.  HIV (human immunodeficiency virus) testing. Early in your pregnancy, you will be screened for HIV. If you are at high risk for HIV, this test may be repeated during your third trimester of pregnancy. You may be offered other testing based on your age, personal or family medical history, or other factors. HOW OFTEN SHOULD I PLAN TO SEE MY HEALTH CARE PROVIDER FOR PRENATAL CARE? Your prenatal care check-up schedule depends on any medical conditions you have before, or develop during, your pregnancy.  If you do not have any underlying medical conditions, you will likely be seen for checkups:  Monthly, during the first 6 months of pregnancy.  Twice a month during months 7 and 8 of pregnancy.  Weekly starting in the 9th month of pregnancy and until delivery. If you develop signs of early labor or other concerning signs or symptoms, you may need to see your health care provider more often. Ask your health care provider what prenatal care schedule is best for you. WHAT CAN I DO TO KEEP MYSELF AND MY BABY AS HEALTHY AS POSSIBLE DURING MY PREGNANCY?  Take a  prenatal vitamin containing 400 micrograms (0.4 mg) of folic acid every day. Your health care provider may also ask you to take additional vitamins such as iodine, vitamin D, iron, copper, and zinc.  Take 1500-2000 mg of calcium daily starting at your 20th week of pregnancy until you deliver your baby.  Make sure you are up to date on your vaccinations. Unless directed otherwise by your health care provider:  You should receive a tetanus, diphtheria, and pertussis (Tdap) vaccination between the 27th and 36th week of your pregnancy, regardless of when your last Tdap immunization occurred. This helps protect your baby from whooping cough (pertussis) after he or she is born.  You should receive an annual inactivated influenza vaccine (IIV) to help protect you and your baby from influenza. This can be done at any point during your pregnancy.  Eat a well-rounded diet that includes:  Fresh fruits and vegetables.  Lean proteins.  Calcium-rich foods such as milk, yogurt, hard cheeses, and dark, leafy greens.  Whole grain breads.  Do noteat seafood high in mercury, including:  Swordfish.  Tilefish.  Shark.  King mackerel.  More than 6 oz tuna per week.  Do not eat:  Raw or undercooked meats or eggs.  Unpasteurized foods, such as soft cheeses (brie, blue, or feta), juices, and milks.  Lunch meats.  Hot dogs that have not been heated until they are steaming.  Drink enough water to keep your urine clear or pale yellow. For many women, this may be 10 or more 8 oz glasses of water each day. Keeping yourself hydrated helps deliver nutrients to your baby and may prevent the start of pre-term uterine contractions.  Do not use any tobacco products including cigarettes, chewing tobacco, or electronic cigarettes. If you need help quitting, ask your health care provider.  Do not drink beverages containing alcohol. No safe level of alcohol consumption during pregnancy has been  determined.  Do not use any illegal drugs. These can harm your developing baby or cause a miscarriage.  Ask your health care provider or pharmacist before taking any prescription or over-the-counter medicines, herbs, or supplements.  Limit your caffeine intake to no more than 200 mg per day.  Exercise. Unless told otherwise by your health care provider, try to get 30 minutes of moderate exercise most days of the week. Do not  do high-impact activities, contact sports, or activities with a high risk of falling, such as horseback riding or downhill skiing.  Get plenty of rest.  Avoid anything that raises your body temperature, such as hot tubs and saunas.  If you own a cat, do not empty its litter box. Bacteria contained in cat feces can cause an infection called toxoplasmosis. This can result in serious harm to the fetus.  Stay away from chemicals such as insecticides, lead, mercury, and cleaning or paint products that contain solvents.  Do  not have any X-rays taken unless medically necessary.  Take a childbirth and breastfeeding preparation class. Ask your health care provider if you need a referral or recommendation. This information is not intended to replace advice given to you by your health care provider. Make sure you discuss any questions you have with your health care provider. Document Released: 10/07/2003 Document Revised: 03/08/2016 Document Reviewed: 12/19/2013 Elsevier Interactive Patient Education  2017 [employer].

## 2016-10-16 LAB — COMPLETE METABOLIC PANEL WITH GFR
ALT: 20 U/L (ref 6–29)
AST: 23 U/L (ref 10–30)
Albumin: 4.7 g/dL (ref 3.6–5.1)
Alkaline Phosphatase: 75 U/L (ref 33–115)
BILIRUBIN TOTAL: 2.9 mg/dL — AB (ref 0.2–1.2)
BUN: 6 mg/dL — AB (ref 7–25)
CO2: 20 mmol/L (ref 20–31)
CREATININE: 0.5 mg/dL (ref 0.50–1.10)
Calcium: 9.4 mg/dL (ref 8.6–10.2)
Chloride: 107 mmol/L (ref 98–110)
GFR, Est Non African American: >89 mL/min (ref >=60)
GLUCOSE: 89 mg/dL (ref 65–99)
Potassium: 4 mmol/L (ref 3.5–5.3)
SODIUM: 139 mmol/L (ref 135–146)
TOTAL PROTEIN: 7.4 g/dL (ref 6.1–8.1)

## 2016-10-16 LAB — LACTATE DEHYDROGENASE: LDH: 185 U/L (ref 100–200)

## 2016-10-16 LAB — VITAMIN D 25 HYDROXY (VIT D DEFICIENCY, FRACTURES): Vit D, 25-Hydroxy: 38 ng/mL (ref 30–100)

## 2016-10-21 ENCOUNTER — Ambulatory Visit: Payer: BLUE CROSS/BLUE SHIELD | Admitting: Family Medicine

## 2017-01-10 ENCOUNTER — Ambulatory Visit (INDEPENDENT_AMBULATORY_CARE_PROVIDER_SITE_OTHER): Payer: BC Managed Care – PPO | Admitting: Family Medicine

## 2017-01-10 ENCOUNTER — Encounter: Payer: Self-pay | Admitting: Family Medicine

## 2017-01-10 VITALS — BP 127/71 | HR 81 | Temp 98.7°F | Resp 14 | Ht 67.0 in | Wt 163.0 lb

## 2017-01-10 DIAGNOSIS — L299 Pruritus, unspecified: Secondary | ICD-10-CM

## 2017-01-10 DIAGNOSIS — L03114 Cellulitis of left upper limb: Secondary | ICD-10-CM

## 2017-01-10 DIAGNOSIS — T148XXA Other injury of unspecified body region, initial encounter: Secondary | ICD-10-CM

## 2017-01-10 DIAGNOSIS — L089 Local infection of the skin and subcutaneous tissue, unspecified: Secondary | ICD-10-CM

## 2017-01-10 DIAGNOSIS — D571 Sickle-cell disease without crisis: Secondary | ICD-10-CM | POA: Diagnosis not present

## 2017-01-10 MED ORDER — CEPHALEXIN 500 MG PO CAPS: 500.0000 mg | ORAL_CAPSULE | Freq: Two times a day (BID) | ORAL | 0 refills | Status: DC

## 2017-01-10 MED ORDER — CETIRIZINE HCL 10 MG PO TABS: 10.0000 mg | ORAL_TABLET | Freq: Every day | ORAL | 11 refills | Status: DC

## 2017-01-10 NOTE — Patient Instructions (Addendum)
Leave open to air at home  Clean with Dial antibacterial soap Will send wound culture Will start Keflex 500 mg BID for left arm wound infection Cellulitis, Adult Cellulitis is a skin infection. The infected area is usually red and sore. This condition occurs most often in the arms and lower legs. It is very important to get treated for this condition. Follow these instructions at home:  Take over-the-counter and prescription medicines only as told by your doctor.  If you were prescribed an antibiotic medicine, take it as told by your doctor. Do not stop taking the antibiotic even if you start to feel better.  Drink enough fluid to keep your pee (urine) clear or pale yellow.  Do not touch or rub the infected area.  Raise (elevate) the infected area above the level of your heart while you are sitting or lying down.  Place warm or cold wet cloths (warm or cold compresses) on the infected area. Do this as told by your doctor.  Keep all follow-up visits as told by your doctor. This is important. These visits let your doctor make sure your infection is not getting worse. Contact a doctor if:  You have a fever.  Your symptoms do not get better after 1-2 days of treatment.  Your bone or joint under the infected area starts to hurt after the skin has healed.  Your infection comes back. This can happen in the same area or another area.  You have a swollen bump in the infected area.  You have new symptoms.  You feel ill and also have muscle aches and pains. Get help right away if:  Your symptoms get worse.  You feel very sleepy.  You throw up (vomit) or have watery poop (diarrhea) for a long time.  There are red streaks coming from the infected area.  Your red area gets larger.  Your red area turns darker. This information is not intended to replace advice given to you by your health care provider. Make sure you discuss any questions you have with your health care  provider. Document Released: 03/22/2008 Document Revised: 03/11/2016 Document Reviewed: 08/13/2015 Elsevier Interactive Patient Education  2017 [employer].

## 2017-01-10 NOTE — Progress Notes (Signed)
Subjective:    Patient ID: Krista [name], female    DOB: August 19, 1986, 31 y.o.   MRN: [id number]  HPI  [name], a 31 year old female with a history of sickle cell anemia, HbSS presents complaining of a worsening wound to left upper arm. [place] had implanon removed from left upper arm  on 3/15 2018 by Gilford Rile, NP at [facility]. She says that she had been covering opening with gauze and band aids. She says that after 4 days she had swelling, drainage and redness to site. She says that left arm pain is 2/10 with drainage. She has been using peroxide several times per day without relief. She denies fever, fatigue, nausea, vomiting, or diarrhea.  Past Medical History:  Diagnosis Date  . Sickle cell anemia (HCC)    Social History   Social History  . Marital status: Married    Spouse name: N/A  . Number of children: N/A  . Years of education: N/A   Occupational History  . Not on file.   Social History Main Topics  . Smoking status: Never Smoker  . Smokeless tobacco: Never Used  . Alcohol use Yes     Comment: socially  . Drug use: No  . Sexual activity: Yes    Birth control/ protection: Implant   Other Topics Concern  . Not on file   Social History Narrative  . No narrative on file   Immunization History  Administered Date(s) Administered  . Influenza,inj,Quad PF,36+ Mos 08/12/2015, 10/15/2016  . Pneumococcal Polysaccharide-23 08/12/2015  No Known Allergies  Review of Systems  Constitutional: Negative.   HENT: Negative.   Eyes: Negative.   Respiratory: Negative.   Cardiovascular: Negative.   Gastrointestinal: Negative.   Endocrine: Negative.   Genitourinary: Negative.   Musculoskeletal: Negative.        Left left anterior deltoid.     Skin: Positive for wound.  Allergic/Immunologic: Negative.   Neurological: Negative.   Hematological: Negative.   Psychiatric/Behavioral: Negative.        Objective:   Physical Exam  Constitutional: She is  oriented to person, place, and time.  HENT:  Head: Normocephalic and atraumatic.  Right Ear: External ear normal.  Left Ear: External ear normal.  Nose: Nose normal.  Mouth/Throat: Oropharynx is clear and moist.  Neck: Normal range of motion. Neck supple.  Cardiovascular: Normal rate, regular rhythm, normal heart sounds and intact distal pulses.   Pulmonary/Chest: Effort normal and breath sounds normal.  Abdominal: Soft. Bowel sounds are normal.  Musculoskeletal: Normal range of motion.  Neurological: She is alert and oriented to person, place, and time. She has normal reflexes.  Skin: Skin is warm and dry.  Cellulitus: Moderate, tan drainage, tender to palpation, with erythema 1.5 cm incision.   Psychiatric: She has a normal mood and affect. Her behavior is normal. Judgment and thought content normal.      BP 127/71 (BP Location: Left Arm, Patient Position: Sitting, Cuff Size: Normal)   Pulse 81   Temp 98.7 F (37.1 C) (Oral)   Resp 14   Ht 5\' 7"  (1.702 m)   Wt 163 lb (73.9 kg)   SpO2 99%   BMI 25.53 kg/m  Assessment & Plan:  1. Cellulitis of left upper arm Wash arm with Dial antibacterial soap. Leave open to air while at home.   - cephALEXin (KEFLEX) 500 MG capsule; Take 1 capsule (500 mg total) by mouth 2 (two) times daily.  Dispense: 20 capsule; Refill: 0 -  WOUND CULTURE - CBC with Differential  2. Itching with irritation - cetirizine (ZYRTEC) 10 MG tablet; Take 1 tablet (10 mg total) by mouth daily.  Dispense: 30 tablet; Refill: 11  3. Hb-SS disease without crisis ([place]) Continue Folic acid and Vitamin D for sickle cell anemia. Follow up in office as previously scheduled  RTC: 3 months for sickle cell anemia The patient was given clear instructions to go to ER or return to medical center if symptoms do not improve, worsen or new problems develop. The patient verbalized understanding. Will notify patient with laboratory results. [name]  MSN, FNP-C [facility] [address] [place], [place] [address] [phone]

## 2017-01-11 DIAGNOSIS — L03114 Cellulitis of left upper limb: Secondary | ICD-10-CM | POA: Insufficient documentation

## 2017-01-11 LAB — CBC WITH DIFFERENTIAL/PLATELET
BASOS ABS: 150 {cells}/uL (ref 0–200)
Basophils Relative: 1 %
Eosinophils Absolute: 450 cells/uL (ref 15–500)
Eosinophils Relative: 3 %
HCT: 27.9 % — ABNORMAL LOW (ref 35.0–45.0)
Hemoglobin: 9.4 g/dL — ABNORMAL LOW (ref 11.7–15.5)
LYMPHS PCT: 31 %
Lymphs Abs: 4650 cells/uL — ABNORMAL HIGH (ref 850–3900)
MCH: 31 pg (ref 27.0–33.0)
MCHC: 33.7 g/dL (ref 32.0–36.0)
MCV: 92.1 fL (ref 80.0–100.0)
MONOS PCT: 7 %
MPV: 9.3 fL (ref 7.5–12.5)
Monocytes Absolute: 1050 cells/uL — ABNORMAL HIGH (ref 200–950)
NEUTROS PCT: 58 %
Neutro Abs: 8700 cells/uL — ABNORMAL HIGH (ref 1500–7800)
PLATELETS: 700 10*3/uL — AB (ref 140–400)
RBC: 3.03 MIL/uL — ABNORMAL LOW (ref 3.80–5.10)
RDW: 15.4 % — ABNORMAL HIGH (ref 11.0–15.0)
WBC: 15 10*3/uL — ABNORMAL HIGH (ref 3.8–10.8)

## 2017-01-12 ENCOUNTER — Other Ambulatory Visit: Payer: Self-pay | Admitting: Family Medicine

## 2017-01-12 ENCOUNTER — Telehealth: Payer: Self-pay

## 2017-01-12 DIAGNOSIS — L03114 Cellulitis of left upper limb: Secondary | ICD-10-CM

## 2017-01-12 MED ORDER — SULFAMETHOXAZOLE-TRIMETHOPRIM 800-160 MG PO TABS: 1.0000 | ORAL_TABLET | Freq: Two times a day (BID) | ORAL | 0 refills | Status: DC

## 2017-01-12 NOTE — Telephone Encounter (Signed)
Patient states that she is having some eye swelling with red marks around her eye. Patient has been on Keflex for 2 days and think it may be from that. Please advise

## 2017-01-12 NOTE — Progress Notes (Signed)
Krista [name] presented on 3/26.2018 with cellulitis to left upper arm after having implanon removed. Patient was started on Keflex. She called reporting swelling to left eye. Advised to stop medication. Will add Bactrim 800-160 mg.   Meds ordered this encounter  Medications  . sulfamethoxazole-trimethoprim (BACTRIM DS,SEPTRA DS) 800-160 MG tablet    Sig: Take 1 tablet by mouth 2 (two) times daily.    Dispense:  20 tablet    Refill:  0    Krista Pounds  MSN, FNP-C [facility] [address] [place], [place] [address] [phone]

## 2017-01-13 ENCOUNTER — Other Ambulatory Visit: Payer: Self-pay | Admitting: Family Medicine

## 2017-01-13 ENCOUNTER — Ambulatory Visit: Payer: BC Managed Care – PPO | Admitting: Family Medicine

## 2017-01-13 ENCOUNTER — Telehealth: Payer: Self-pay

## 2017-01-13 LAB — WOUND CULTURE
GRAM STAIN: NONE SEEN
Gram Stain: NONE SEEN
Gram Stain: NONE SEEN
Organism ID, Bacteria: NORMAL

## 2017-01-14 ENCOUNTER — Ambulatory Visit: Payer: BC Managed Care – PPO | Admitting: Family Medicine

## 2017-01-25 ENCOUNTER — Other Ambulatory Visit: Payer: Self-pay | Admitting: Family Medicine

## 2017-01-25 DIAGNOSIS — E559 Vitamin D deficiency, unspecified: Secondary | ICD-10-CM

## 2017-03-24 ENCOUNTER — Ambulatory Visit (INDEPENDENT_AMBULATORY_CARE_PROVIDER_SITE_OTHER): Payer: BC Managed Care – PPO | Admitting: Family Medicine

## 2017-03-24 ENCOUNTER — Encounter: Payer: Self-pay | Admitting: Family Medicine

## 2017-03-24 VITALS — BP 116/69 | HR 85 | Temp 98.3°F | Resp 14 | Ht 67.0 in | Wt 169.0 lb

## 2017-03-24 DIAGNOSIS — D571 Sickle-cell disease without crisis: Secondary | ICD-10-CM | POA: Diagnosis not present

## 2017-03-24 DIAGNOSIS — K219 Gastro-esophageal reflux disease without esophagitis: Secondary | ICD-10-CM

## 2017-03-24 DIAGNOSIS — R358 Other polyuria: Secondary | ICD-10-CM | POA: Diagnosis not present

## 2017-03-24 DIAGNOSIS — R635 Abnormal weight gain: Secondary | ICD-10-CM | POA: Diagnosis not present

## 2017-03-24 DIAGNOSIS — R3589 Other polyuria: Secondary | ICD-10-CM

## 2017-03-24 DIAGNOSIS — F4320 Adjustment disorder, unspecified: Secondary | ICD-10-CM | POA: Diagnosis not present

## 2017-03-24 DIAGNOSIS — F4321 Adjustment disorder with depressed mood: Secondary | ICD-10-CM

## 2017-03-24 DIAGNOSIS — R079 Chest pain, unspecified: Secondary | ICD-10-CM

## 2017-03-24 LAB — COMPLETE METABOLIC PANEL WITH GFR
ALBUMIN: 4.6 g/dL (ref 3.6–5.1)
ALK PHOS: 84 U/L (ref 33–115)
ALT: 35 U/L — ABNORMAL HIGH (ref 6–29)
AST: 29 U/L (ref 10–30)
BUN: 9 mg/dL (ref 7–25)
CO2: 22 mmol/L (ref 20–31)
Calcium: 9.7 mg/dL (ref 8.6–10.2)
Chloride: 103 mmol/L (ref 98–110)
Creat: 0.51 mg/dL (ref 0.50–1.10)
Glucose, Bld: 80 mg/dL (ref 65–99)
Potassium: 4.7 mmol/L (ref 3.5–5.3)
Sodium: 137 mmol/L (ref 135–146)
Total Bilirubin: 2.4 mg/dL — ABNORMAL HIGH (ref 0.2–1.2)
Total Protein: 7.4 g/dL (ref 6.1–8.1)

## 2017-03-24 LAB — POCT URINALYSIS DIP (DEVICE)
BILIRUBIN URINE: NEGATIVE
Glucose, UA: NEGATIVE mg/dL
HGB URINE DIPSTICK: NEGATIVE
KETONES UR: NEGATIVE mg/dL
LEUKOCYTES UA: NEGATIVE
NITRITE: NEGATIVE
Protein, ur: NEGATIVE mg/dL
Specific Gravity, Urine: 1.015 (ref 1.005–1.030)
Urobilinogen, UA: 1 mg/dL (ref 0.0–1.0)
pH: 6 (ref 5.0–8.0)

## 2017-03-24 MED ORDER — PANTOPRAZOLE SODIUM 20 MG PO TBEC: 20.0000 mg | DELAYED_RELEASE_TABLET | Freq: Every day | ORAL | 1 refills | Status: DC

## 2017-03-24 NOTE — Progress Notes (Signed)
Subjective:    Patient ID: Krista [name], female    DOB: [birth year], 31 y.o.   MRN: [id number]  [place], a 31 year old female with a history of sickle cell anemia presents complaining of periodic chest pain and heart palpitations over the past several days. She says that she has not experienced a sickle cell crisis in years and only takes Ibuprofen for sickle cell pain. She describes chest pain as sharp and non radiating. [name] is not having pain at present. She has not identified any palliative or provocative factors. Patient exercised 3-4 times per week and generally eats a healthy diet. She endorses weight gain and fatigue. She also reports that her father passed away from diabetes complications in April. She says that she has been under a great deal of stress. She denies headache,  fever, shortness of breath, dysuria, nausea, or constipation.   Past Medical History:  Diagnosis Date  . Sickle cell anemia (HCC)    Social History   Social History  . Marital status: Married    Spouse name: N/A  . Number of children: N/A  . Years of education: N/A   Occupational History  . Not on file.   Social History Main Topics  . Smoking status: Never Smoker  . Smokeless tobacco: Never Used  . Alcohol use Yes     Comment: socially  . Drug use: No  . Sexual activity: Yes    Birth control/ protection: Implant   Other Topics Concern  . Not on file   Social History Narrative  . No narrative on file    Review of Systems  Constitutional: Positive for unexpected weight change (weight gain).  Respiratory: Negative for shortness of breath.   Cardiovascular: Positive for chest pain and palpitations.  Gastrointestinal: Positive for nausea.  Endocrine: Negative for polydipsia, polyphagia and polyuria.  Genitourinary: Positive for dysuria.  Musculoskeletal: Negative.   Skin: Negative.   Neurological: Negative.   Hematological: Negative.   Psychiatric/Behavioral: Negative.       Objective:   Physical Exam  Constitutional: She is oriented to person, place, and time.  HENT:  Head: Normocephalic and atraumatic.  Right Ear: External ear normal.  Left Ear: External ear normal.  Eyes: Conjunctivae and EOM are normal. Pupils are equal, round, and reactive to light.  Neck: Normal range of motion. Neck supple.  Pulmonary/Chest: Effort normal and breath sounds normal.  Abdominal: Soft. Bowel sounds are normal.  Neurological: She is alert and oriented to person, place, and time. She has normal reflexes.  Skin: Skin is warm.  Psychiatric: She has a normal mood and affect. Her behavior is normal. Judgment and thought content normal.  Patient tearful when discussing her father.      BP 116/69 (BP Location: Left Arm, Patient Position: Sitting, Cuff Size: Normal)   Pulse 85   Temp 98.3 F (36.8 C) (Oral)   Resp 14   Ht 5\' 7"  (1.702 m)   Wt 169 lb (76.7 kg)   LMP 03/16/2017   SpO2 100%   BMI 26.47 kg/m  Assessment & Plan:  1. Chest pain, unspecified type Normal sinus rhythm on EKG The patient was given clear instructions to go to ER or return to medical center if symptoms do not improve, worsen or new problems develop. The patient verbalized understanding.  - EKG 12-Lead - CBC with Differential - COMPLETE METABOLIC PANEL WITH GFR  2. Hb-SS disease without crisis ([place]) - CBC with Differential  -COMPLETE METABOLIC PANEL WITH  GFR   3. Polyuria - HgB A1c - Fructosamine  4. Weight gain Recommend a lowfat, low carbohydrate diet divided over 5-6 small meals, increase water intake to 6-8 glasses, and 150 minutes per week of cardiovascular exercise.   - TSH  6. Gastroesophageal reflux disease without esophagitis - pantoprazole (PROTONIX) 20 MG tablet; Take 1 tablet (20 mg total) by mouth daily.  Dispense: 30 tablet; Refill: 1  7. Grief reaction [name] says that her father passed away from diabetes complications in April 4801. She has had increase concerns  about her health since that time. She is not interested in therapy at this time.    RTC: 3 months for sickle cell anemia   [name]  MSN, FNP-C [facility] [address] [place], [place] [address] [phone]

## 2017-03-24 NOTE — Patient Instructions (Addendum)
Chest Wall Pain °Chest wall pain is pain in or around the bones and muscles of your chest. Sometimes, an injury causes this pain. Sometimes, the cause may not be known. This pain may take several weeks or longer to get better. °Follow these instructions at home: °Pay attention to any changes in your symptoms. Take these actions to help with your pain: °· Rest as told by your doctor. °· Avoid activities that cause pain. Try not to use your chest, belly (abdominal), or side muscles to lift heavy things. °· If directed, apply ice to the painful area: °? Put ice in a plastic bag. °? Place a towel between your skin and the bag. °? Leave the ice on for 20 minutes, 2-3 times per day. °· Take over-the-counter and prescription medicines only as told by your doctor. °· Do not use tobacco products, including cigarettes, chewing tobacco, and e-cigarettes. If you need help quitting, ask your doctor. °· Keep all follow-up visits as told by your doctor. This is important. ° °Contact a doctor if: °· You have a fever. °· Your chest pain gets worse. °· You have new symptoms. °Get help right away if: °· You feel sick to your stomach (nauseous) or you throw up (vomit). °· You feel sweaty or light-headed. °· You have a cough with phlegm (sputum) or you cough up blood. °· You are short of breath. °This information is not intended to replace advice given to you by your health care provider. Make sure you discuss any questions you have with your health care provider. °Document Released: 03/22/2008 Document Revised: 03/11/2016 Document Reviewed: 12/30/2014 °Elsevier Interactive Patient Education © 2018 Elsevier Inc. ° °Sickle Cell Anemia, Adult °Sickle cell anemia is a condition in which red blood cells have an abnormal “sickle” shape. This abnormal shape shortens the cells’ life span, which results in a lower than normal concentration of red blood cells in the blood. The sickle shape also causes the cells to clump together and block free  blood flow through the blood vessels. As a result, the tissues and organs of the body do not receive enough oxygen. Sickle cell anemia causes organ damage and pain and increases the risk of infection. °What are the causes? °Sickle cell anemia is a genetic disorder. Those who receive two copies of the gene have the condition, and those who receive one copy have the trait. °What increases the risk? °The sickle cell gene is most common in people whose families originated in [place]. Other areas of the globe where sickle cell trait occurs include the Mediterranean, [place] and [place], the [place], and the [place]. °What are the signs or symptoms? °· Pain, especially in the extremities, back, chest, or abdomen (common). The pain may start suddenly or may develop following an illness, especially if there is dehydration. Pain can also occur due to overexertion or exposure to extreme temperature changes. °· Frequent severe bacterial infections, especially certain types of pneumonia and meningitis. °· Pain and swelling in the hands and feet. °· Decreased activity. °· Loss of appetite. °· Change in behavior. °· Headaches. °· Seizures. °· Shortness of breath or difficulty breathing. °· Vision changes. °· Skin ulcers. °Those with the trait may not have symptoms or they may have mild symptoms. °How is this diagnosed? °Sickle cell anemia is diagnosed with blood tests that demonstrate the genetic trait. It is often diagnosed during the newborn period, due to mandatory testing nationwide. A variety of blood tests, X-rays, CT scans, MRI scans, ultrasounds, and   lung function tests may also be done to monitor the condition. °How is this treated? °Sickle cell anemia may be treated with: °· Medicines. You may be given pain medicines, antibiotic medicines (to treat and prevent infections) or medicines to increase the production of certain types of hemoglobin. °· Fluids. °· Oxygen. °· Blood transfusions. ° °Follow these  instructions at home: °· Drink enough fluid to keep your urine clear or pale yellow. Increase your fluid intake in hot weather and during exercise. °· Do not smoke. Smoking lowers oxygen levels in the blood. °· Only take over-the-counter or prescription medicines for pain, fever, or discomfort as directed by your health care provider. °· Take antibiotics as directed by your health care provider. Make sure you finish them it even if you start to feel better. °· Take supplements as directed by your health care provider. °· Consider wearing a medical alert bracelet. This tells anyone caring for you in an emergency of your condition. °· When traveling, keep your medical information, health care provider's names, and the medicines you take with you at all times. °· If you develop a fever, do not take medicines to reduce the fever right away. This could cover up a problem that is developing. Notify your health care provider. °· Keep all follow-up appointments with your health care provider. Sickle cell anemia requires regular medical care. °Contact a health care provider if: °You have a fever. °Get help right away if: °· You feel dizzy or faint. °· You have new abdominal pain, especially on the left side near the stomach area. °· You develop a persistent, often uncomfortable and painful penile erection (priapism). If this is not treated immediately it will lead to impotence. °· You have numbness your arms or legs or you have a hard time moving them. °· You have a hard time with speech. °· You have a fever or persistent symptoms for more than 2-3 days. °· You have a fever and your symptoms suddenly get worse. °· You have signs or symptoms of infection. These include: °? Chills. °? Abnormal tiredness (lethargy). °? Irritability. °? Poor eating. °? Vomiting. °· You develop pain that is not helped with medicine. °· You develop shortness of breath. °· You have pain in your chest. °· You are coughing up pus-like or bloody  sputum. °· You develop a stiff neck. °· Your feet or hands swell or have pain. °· Your abdomen appears bloated. °· You develop joint pain. °This information is not intended to replace advice given to you by your health care provider. Make sure you discuss any questions you have with your health care provider. °Document Released: 01/12/2006 Document Revised: 04/23/2016 Document Reviewed: 05/16/2013 °Elsevier Interactive Patient Education © 2017 Elsevier Inc. ° °

## 2017-03-25 LAB — CBC WITH DIFFERENTIAL/PLATELET
BASOS PCT: 1 %
Basophils Absolute: 153 cells/uL (ref 0–200)
Eosinophils Absolute: 153 cells/uL (ref 15–500)
Eosinophils Relative: 1 %
HCT: 27.1 % — ABNORMAL LOW (ref 35.0–45.0)
Hemoglobin: 8.9 g/dL — ABNORMAL LOW (ref 11.7–15.5)
LYMPHS ABS: 5355 {cells}/uL — AB (ref 850–3900)
LYMPHS PCT: 35 %
MCH: 30 pg (ref 27.0–33.0)
MCHC: 32.8 g/dL (ref 32.0–36.0)
MCV: 91.2 fL (ref 80.0–100.0)
MONO ABS: 1224 {cells}/uL — AB (ref 200–950)
MPV: 9.3 fL (ref 7.5–12.5)
Monocytes Relative: 8 %
Neutro Abs: 8415 cells/uL — ABNORMAL HIGH (ref 1500–7800)
Neutrophils Relative %: 55 %
PLATELETS: 727 10*3/uL — AB (ref 140–400)
RBC: 2.97 MIL/uL — AB (ref 3.80–5.10)
RDW: 16.3 % — ABNORMAL HIGH (ref 11.0–15.0)
WBC: 15.3 10*3/uL — AB (ref 3.8–10.8)

## 2017-03-25 LAB — HEMOGLOBIN A1C

## 2017-03-25 LAB — TSH: TSH: 2.21 mIU/L

## 2017-03-28 ENCOUNTER — Telehealth: Payer: Self-pay

## 2017-03-28 LAB — FRUCTOSAMINE: Fructosamine: 249 umol/L (ref 190–270)

## 2017-03-28 NOTE — Telephone Encounter (Signed)
Called and spoke with patient advised of normal labs and to scheduled a follow up if the problem persist or if new symptoms arise. Patient verbalized understanding. Thanks!

## 2017-03-28 NOTE — Telephone Encounter (Signed)
-----   Message from Dorena Dew, [place] sent at 03/26/2017  6:03 PM EDT ----- Regarding: lab resutls Please inform Farida that all labs are within a normal range. Please schedule follow up if problems persist or new problems develop.  Thanks ----- Message ----- From: Interface, [facility] Sent: 03/25/2017   1:12 AM To: Dorena Dew, FNP

## 2017-04-12 ENCOUNTER — Ambulatory Visit: Payer: BC Managed Care – PPO | Admitting: Family Medicine

## 2017-05-29 ENCOUNTER — Other Ambulatory Visit: Payer: Self-pay | Admitting: Family Medicine

## 2017-05-29 DIAGNOSIS — K219 Gastro-esophageal reflux disease without esophagitis: Secondary | ICD-10-CM

## 2017-06-08 ENCOUNTER — Ambulatory Visit (INDEPENDENT_AMBULATORY_CARE_PROVIDER_SITE_OTHER): Payer: BC Managed Care – PPO | Admitting: Family Medicine

## 2017-06-08 ENCOUNTER — Encounter: Payer: Self-pay | Admitting: Family Medicine

## 2017-06-08 VITALS — BP 127/80 | HR 95 | Temp 98.7°F | Resp 14 | Ht 67.0 in | Wt 170.0 lb

## 2017-06-08 DIAGNOSIS — Z3A01 Less than 8 weeks gestation of pregnancy: Secondary | ICD-10-CM | POA: Diagnosis not present

## 2017-06-08 DIAGNOSIS — Z23 Encounter for immunization: Secondary | ICD-10-CM

## 2017-06-08 DIAGNOSIS — E559 Vitamin D deficiency, unspecified: Secondary | ICD-10-CM | POA: Diagnosis not present

## 2017-06-08 DIAGNOSIS — N912 Amenorrhea, unspecified: Secondary | ICD-10-CM

## 2017-06-08 LAB — HCG, QUANTITATIVE, PREGNANCY: hCG, Beta Chain, Quant, S: 486.5 m[IU]/mL — ABNORMAL HIGH

## 2017-06-08 LAB — POCT URINE PREGNANCY: PREG TEST UR: POSITIVE — AB

## 2017-06-08 MED ORDER — VITAMIN D 1000 UNITS PO TABS: 1000.0000 [IU] | ORAL_TABLET | Freq: Every day | ORAL | 11 refills | Status: DC

## 2017-06-08 NOTE — Progress Notes (Signed)
Chief Complaint  Patient presents with  . Possible Pregnancy    POSITIEVE TEST AT HOME      Missed Menses/ Possible Pregnancy Krista [name], a 31 year old female with a history of sickle cell anemia, HbSS presents for evaluation after getting a positive result on a home pregnancy test. Krista [name] and her spouse have been trying to conceive over the past 6 months. She had Implanon removed in March. Patient's last menstrual period was on 05/05/2017. She generally feels well.  Current symptoms also include: weight gain. Past Medical History:  Diagnosis Date  . Sickle cell anemia (HCC)    Social History   Social History  . Marital status: Married    Spouse name: N/A  . Number of children: N/A  . Years of education: N/A   Occupational History  . Not on file.   Social History Main Topics  . Smoking status: Never Smoker  . Smokeless tobacco: Never Used  . Alcohol use Yes     Comment: socially  . Drug use: No  . Sexual activity: Yes   Other Topics Concern  . Not on file   Social History Narrative  . No narrative on file   Immunization History  Administered Date(s) Administered  . Influenza,inj,Quad PF,6+ Mos 08/12/2015, 10/15/2016, 06/08/2017  . Pneumococcal Polysaccharide-23 08/12/2015   Family History  Problem Relation Age of Onset  . Diabetes Father   . Hypertension Father    Allergies  Allergen Reactions  . Keflex [Cephalexin] Swelling   Review of Systems  Constitutional: Negative.   HENT: Negative.   Eyes: Negative.   Respiratory: Negative.   Gastrointestinal: Negative for abdominal pain and blood in stool.  Genitourinary: Negative for dysuria, flank pain, frequency and urgency.  Musculoskeletal: Negative.   Skin: Negative.  Negative for rash.  Neurological: Negative for weakness.   Physical Exam  Constitutional: She is oriented to person, place, and time and well-developed, well-nourished, and in no distress.  HENT:  Head: Normocephalic.  Eyes: Pupils are  equal, round, and reactive to light.  Neck: Normal range of motion.  Cardiovascular: Normal rate, regular rhythm, normal heart sounds and intact distal pulses.   Pulmonary/Chest: Effort normal and breath sounds normal.  Abdominal: Soft. Bowel sounds are normal.  Musculoskeletal: Normal range of motion.  Neurological: She is alert and oriented to person, place, and time. Gait normal.  Skin: Skin is warm and dry.  Psychiatric: Mood, memory, affect and judgment normal.    BP 127/80 (BP Location: Left Arm, Patient Position: Sitting, Cuff Size: Normal)   Pulse 95   Temp 98.7 F (37.1 C) (Oral)   Resp 14   Ht 5\' 7"  (1.702 m)   Wt 170 lb (77.1 kg)   LMP 05/05/2017   SpO2 100%   BMI 26.63 kg/m   1. Amenorrhea - POCT urine pregnancy - hCG, quantitative, pregnancy; Future  2. Less than [redacted] weeks gestation of pregnancy Called [facility]. Patient will establish care with Dr. Marylynn Pearson, obstetrician Scheduled 4 initial appointments Recommend that patient continues prenatal vitamins Will discontinue ASA therapy; will follow thrombocytosis frequently Will also review hemoglobin electrophoresis in 2nd trimester Will send referral to hematologist in 2nd trimester Provided written information on pregnancy in the 1st trimester - hCG, quantitative, pregnancy - hCG, quantitative, pregnancy; Future - Ambulatory referral to Obstetrics / Gynecology  3. Vitamin D deficiency - cholecalciferol (VITAMIN D) 1000 units tablet; Take 1 tablet (1,000 Units total) by mouth daily.  Dispense: 30 tablet; Refill: 11  4. Influenza vaccine administered - Flu Vaccine QUAD 6+ mos PF IM (Fluarix Quad PF)   RTC: 1 month for sickle cell anemia   [name]  MSN, FNP-C Patient Krista [name] [address] [place], [place] [address] [phone]

## 2017-06-08 NOTE — Patient Instructions (Addendum)
Your obstetrician will be Dr. Marylynn Pearson at [facility] Your first ultrasound will be on Monday, 07/04/2017 at 3 pm and you will meet Dr. Julien Girt at 3:20 On Tuesday, 07/12/2017, you will meet with OB nurse at 4: 00 for a 1 hour visit Your 1st OB appt with Dr. Julien Girt will be on 07/20/2017 at 3:00 Your 1st trimester ultrasound will be on 08/02/2017 at 4:00     Common Medications Safe in Pregnancy  Acne:      Constipation:  Benzoyl Peroxide     Colace  Clindamycin      Dulcolax Suppository  Topica Erythromycin     Fibercon  Salicylic Acid      Metamucil         Miralax AVOID:        Senakot   Accutane    Cough:  Retin-A       Cough Drops  Tetracycline      Phenergan w/ Codeine if Rx  Minocycline      Robitussin (Plain & DM)  Antibiotics:     Crabs/Lice:  Ceclor       RID  Cephalosporins    AVOID:  E-Mycins      Kwell  Keflex  Macrobid/Macrodantin   Diarrhea:  Penicillin      Kao-Pectate  Zithromax      Imodium AD         PUSH FLUIDS AVOID:       Cipro     Fever:  Tetracycline      Tylenol (Regular or Extra  Minocycline       Strength)  Levaquin      Extra Strength-Do not          Exceed 8 tabs/24 hrs Caffeine:        <277m/day (equiv. To 1 cup of coffee or  approx. 3 12 oz sodas)         Gas: Cold/Hayfever:       Gas-X  Benadryl      Mylicon  Claritin       Phazyme  **Claritin-D        Chlor-Trimeton    Headaches:  Dimetapp      ASA-Free Excedrin  Drixoral-Non-Drowsy     Cold Compress  Mucinex (Guaifenasin)     Tylenol (Regular or Extra  Sudafed/Sudafed-12 Hour     Strength)  **Sudafed PE Pseudoephedrine   Tylenol Cold & Sinus     Vicks Vapor Rub  Zyrtec  **AVOID if Problems With Blood Pressure         Heartburn: Avoid lying down for at least 1 hour after meals  Aciphex      Maalox     Rash:  Milk of Magnesia     Benadryl    Mylanta       1% Hydrocortisone Cream  Pepcid  Pepcid Complete   Sleep  Aids:  Prevacid      Ambien   Prilosec       Benadryl  Rolaids       Chamomile Tea  Tums (Limit 4/day)     Unisom  Zantac       Tylenol PM         Warm milk-add vanilla or  Hemorrhoids:       Sugar for taste  Anusol/Anusol H.C.  (RX: Analapram 2.5%)  Sugar Substitutes:  Hydrocortisone OTC     Ok in moderation  Preparation H      Tucks  Vaseline lotion applied to tissue with wiping    Herpes:     Throat:  Acyclovir      Oragel  Famvir  Valtrex     Vaccines:         Flu Shot Leg Cramps:       *Gardasil  Benadryl      Hepatitis A         Hepatitis B Nasal Spray:       Pneumovax  Saline Nasal Spray     Polio Booster         Tetanus Nausea:       Tuberculosis test or PPD  Vitamin B6 25 mg TID   AVOID:    Dramamine      *Gardasil  Emetrol       Live Poliovirus  Ginger Root 250 mg QID    MMR (measles, mumps &  High Complex Carbs @ Bedtime    rebella)  Sea Bands-Accupressure    Varicella (Chickenpox)  Unisom 1/2 tab TID     *No known complications           If received before Pain:         Known pregnancy;   Darvocet       Resume series after  Lortab        Delivery  Percocet    Yeast:   Tramadol      Femstat  Tylenol 3      Gyne-lotrimin  Ultram       Monistat  Vicodin           MISC:         All Sunscreens           Hair Coloring/highlights          Insect Repellant's          (Including DEET)         Mystic Tans  First Trimester of Pregnancy The first trimester of pregnancy is from week 1 until the end of week 13 (months 1 through 3). During this time, your baby will begin to develop inside you. At 6-8 weeks, the eyes and face are formed, and the heartbeat can be seen on ultrasound. At the end of 12 weeks, all the baby's organs are formed. Prenatal care is all the medical care you receive before the birth of your baby. Make sure you get good prenatal care and follow all of your doctor's instructions. Follow these instructions at home: Medicines  Take  over-the-counter and prescription medicines only as told by your doctor. Some medicines are safe and some medicines are not safe during pregnancy.  Take a prenatal vitamin that contains at least 600 micrograms (mcg) of folic acid.  If you have trouble pooping (constipation), take medicine that will make your stool soft (stool softener) if your doctor approves. Eating and drinking  Eat regular, healthy meals.  Your doctor will tell you the amount of weight gain that is right for you.  Avoid raw meat and uncooked cheese.  If you feel sick to your stomach (nauseous) or throw up (vomit): ? Eat 4 or 5 small meals a day instead of 3 large meals. ? Try eating a few soda crackers. ? Drink liquids between meals instead of during meals.  To prevent constipation: ? Eat foods that are high in fiber, like fresh fruits and vegetables, whole grains, and beans. ? Drink enough fluids to keep your pee (urine) clear or pale yellow. Activity  Exercise only  as told by your doctor. Stop exercising if you have cramps or pain in your lower belly (abdomen) or low back.  Do not exercise if it is too hot, too humid, or if you are in a place of great height (high altitude).  Try to avoid standing for long periods of time. Move your legs often if you must stand in one place for a long time.  Avoid heavy lifting.  Wear low-heeled shoes. Sit and stand up straight.  You can have sex unless your doctor tells you not to. Relieving pain and discomfort  Wear a good support bra if your breasts are sore.  Take warm water baths (sitz baths) to soothe pain or discomfort caused by hemorrhoids. Use hemorrhoid cream if your doctor says it is okay.  Rest with your legs raised if you have leg cramps or low back pain.  If you have puffy, bulging veins (varicose veins) in your legs: ? Wear support hose or compression stockings as told by your doctor. ? Raise (elevate) your feet for 15 minutes, 3-4 times a day. ? Limit  salt in your food. Prenatal care  Schedule your prenatal visits by the twelfth week of pregnancy.  Write down your questions. Take them to your prenatal visits.  Keep all your prenatal visits as told by your doctor. This is important. Safety  Wear your seat belt at all times when driving.  Make a list of emergency phone numbers. The list should include numbers for family, friends, the hospital, and police and fire departments. General instructions  Ask your doctor for a referral to a local prenatal class. Begin classes no later than at the start of month 6 of your pregnancy.  Ask for help if you need counseling or if you need help with nutrition. Your doctor can give you advice or tell you where to go for help.  Do not use hot tubs, steam rooms, or saunas.  Do not douche or use tampons or scented sanitary pads.  Do not cross your legs for long periods of time.  Avoid all herbs and alcohol. Avoid drugs that are not approved by your doctor.  Do not use any tobacco products, including cigarettes, chewing tobacco, and electronic cigarettes. If you need help quitting, ask your doctor. You may get counseling or other support to help you quit.  Avoid cat litter boxes and soil used by cats. These carry germs that can cause birth defects in the baby and can cause a loss of your baby (miscarriage) or stillbirth.  Visit your dentist. At home, brush your teeth with a soft toothbrush. Be gentle when you floss. Contact a doctor if:  You are dizzy.  You have mild cramps or pressure in your lower belly.  You have a nagging pain in your belly area.  You continue to feel sick to your stomach, you throw up, or you have watery poop (diarrhea).  You have a bad smelling fluid coming from your vagina.  You have pain when you pee (urinate).  You have increased puffiness (swelling) in your face, hands, legs, or ankles. Get help right away if:  You have a fever.  You are leaking fluid from  your vagina.  You have spotting or bleeding from your vagina.  You have very bad belly cramping or pain.  You gain or lose weight rapidly.  You throw up blood. It may look like coffee grounds.  You are around people who have [place] measles, fifth disease, or chickenpox.  You  have a very bad headache.  You have shortness of breath.  You have any kind of trauma, such as from a fall or a car accident. Summary  The first trimester of pregnancy is from week 1 until the end of week 13 (months 1 through 3).  To take care of yourself and your unborn baby, you will need to eat healthy meals, take medicines only if your doctor tells you to do so, and do activities that are safe for you and your baby.  Keep all follow-up visits as told by your doctor. This is important as your doctor will have to ensure that your baby is healthy and growing well. This information is not intended to replace advice given to you by your health care provider. Make sure you discuss any questions you have with your health care provider. Document Released: 03/22/2008 Document Revised: 10/12/2016 Document Reviewed: 10/12/2016 Elsevier Interactive Patient Education  2017 [employer].

## 2017-06-10 ENCOUNTER — Other Ambulatory Visit: Payer: BC Managed Care – PPO

## 2017-06-10 LAB — POCT URINALYSIS DIP (DEVICE)
Bilirubin Urine: NEGATIVE
GLUCOSE, UA: NEGATIVE mg/dL
Hgb urine dipstick: NEGATIVE
Ketones, ur: NEGATIVE mg/dL
Leukocytes, UA: NEGATIVE
Nitrite: NEGATIVE
PROTEIN: NEGATIVE mg/dL
Specific Gravity, Urine: 1.015 (ref 1.005–1.030)
UROBILINOGEN UA: 4 mg/dL — AB (ref 0.0–1.0)
pH: 6.5 (ref 5.0–8.0)

## 2017-06-11 DIAGNOSIS — Z3A01 Less than 8 weeks gestation of pregnancy: Secondary | ICD-10-CM | POA: Insufficient documentation

## 2017-06-11 DIAGNOSIS — N912 Amenorrhea, unspecified: Secondary | ICD-10-CM | POA: Insufficient documentation

## 2017-06-12 ENCOUNTER — Other Ambulatory Visit: Payer: Self-pay | Admitting: Family Medicine

## 2017-06-12 DIAGNOSIS — Z3A01 Less than 8 weeks gestation of pregnancy: Secondary | ICD-10-CM

## 2017-06-13 ENCOUNTER — Other Ambulatory Visit (INDEPENDENT_AMBULATORY_CARE_PROVIDER_SITE_OTHER): Payer: BC Managed Care – PPO

## 2017-06-13 DIAGNOSIS — N912 Amenorrhea, unspecified: Secondary | ICD-10-CM

## 2017-06-13 DIAGNOSIS — Z3A01 Less than 8 weeks gestation of pregnancy: Secondary | ICD-10-CM

## 2017-06-14 ENCOUNTER — Other Ambulatory Visit: Payer: Self-pay | Admitting: Family Medicine

## 2017-06-14 DIAGNOSIS — O0281 Inappropriate change in quantitative human chorionic gonadotropin (hCG) in early pregnancy: Secondary | ICD-10-CM

## 2017-06-14 LAB — HCG, QUANTITATIVE, PREGNANCY: HCG, BETA CHAIN, QUANT, S: 272 m[IU]/mL — AB

## 2017-06-14 NOTE — Progress Notes (Signed)
Phoned Lamiah to advise of abnormal decrease in HCG level compared to prior HCG level 06/08/2017. She confirmed that she had experienced bleeding and cramping yesterday. Follow-up with OB today who confirmed via ultrasound that she was indeed having a miscarriage. She denies current pain, only mild cramping. She will follow-up with Cammie Sickle, FNP-C  within a few weeks or sooner if needed.   Carroll Sage. Kenton Kingfisher, MSN, FNP-C [facility]  [address] [place], [place] [address] [phone]

## 2017-06-15 ENCOUNTER — Other Ambulatory Visit (INDEPENDENT_AMBULATORY_CARE_PROVIDER_SITE_OTHER): Payer: BC Managed Care – PPO

## 2017-06-15 DIAGNOSIS — Z111 Encounter for screening for respiratory tuberculosis: Secondary | ICD-10-CM

## 2017-06-17 ENCOUNTER — Other Ambulatory Visit: Payer: Self-pay | Admitting: Family Medicine

## 2017-06-17 LAB — QUANTIFERON TB GOLD ASSAY (BLOOD)
Interferon Gamma Release Assay: NEGATIVE
Mitogen-Nil: >10 IU/mL
Quantiferon Nil Value: 0.02 IU/mL
Quantiferon Tb Ag Minus Nil Value: 0.04 IU/mL

## 2017-06-21 ENCOUNTER — Telehealth: Payer: Self-pay

## 2017-06-21 NOTE — Telephone Encounter (Signed)
Called, no answer. Left a message that TB test was negative and if any questions to call us back and left call back number. Thanks!

## 2017-06-21 NOTE — Telephone Encounter (Signed)
-----   Message from Scot Jun, [place] sent at 06/17/2017  6:33 PM EDT ----- Please notify patient for negative Tb assay.

## 2017-06-24 ENCOUNTER — Telehealth: Payer: Self-pay | Admitting: Family Medicine

## 2017-06-24 ENCOUNTER — Ambulatory Visit: Payer: BC Managed Care – PPO | Admitting: Family Medicine

## 2017-06-24 NOTE — Telephone Encounter (Signed)
Patient dropped by office for appt and appt was rescheduled for 07/11/17. Patient stated that she still wanted to be seen for, "changes in her health" regarding her pregnancy since she was last seen. Patient made aware the [place] was unavailable and wouldn't be back in the office until Monday.

## 2017-07-11 ENCOUNTER — Ambulatory Visit (INDEPENDENT_AMBULATORY_CARE_PROVIDER_SITE_OTHER): Payer: BC Managed Care – PPO | Admitting: Family Medicine

## 2017-07-11 ENCOUNTER — Encounter: Payer: Self-pay | Admitting: Family Medicine

## 2017-07-11 VITALS — BP 117/70 | HR 86 | Temp 98.7°F | Resp 16 | Ht 67.0 in | Wt 171.0 lb

## 2017-07-11 DIAGNOSIS — D571 Sickle-cell disease without crisis: Secondary | ICD-10-CM

## 2017-07-11 DIAGNOSIS — H6123 Impacted cerumen, bilateral: Secondary | ICD-10-CM

## 2017-07-11 MED ORDER — ACETAMINOPHEN 500 MG PO TABS: 500.0000 mg | ORAL_TABLET | Freq: Four times a day (QID) | ORAL | 0 refills | Status: DC | PRN

## 2017-07-11 NOTE — Progress Notes (Signed)
Subjective:    Patient ID: Krista [name], female    DOB: 04-Aug-1986, 31 y.o.   MRN: [id number]  HPI Krista [name], a 31 year old female with a history of sickle cell anemia presents for a follow up. Patient recently experienced a miscarriage in first trimester. She is under the care of Dr. Marylynn Pearson, obstetrician. She is actively trying to conceive.     She says that she has not experienced a sickle cell crisis in greater than 2 years. She does not have pain at present. She typically does cardiovascular exercise 3-4 times per week. She eats a healthy, balanced diet. She denies fatigue,  headache,  fever, shortness of breath, dysuria, nausea, or constipation.   Past Medical History:  Diagnosis Date  . Sickle cell anemia (HCC)    Social History   Social History  . Marital status: Married    Spouse name: N/A  . Number of children: N/A  . Years of education: N/A   Occupational History  . Not on file.   Social History Main Topics  . Smoking status: Never Smoker  . Smokeless tobacco: Never Used  . Alcohol use Yes     Comment: socially  . Drug use: No  . Sexual activity: Yes   Other Topics Concern  . Not on file   Social History Narrative  . No narrative on file    Review of Systems  Respiratory: Negative for shortness of breath.   Endocrine: Negative for polydipsia, polyphagia and polyuria.  Musculoskeletal: Negative.   Skin: Negative.   Neurological: Negative.   Hematological: Negative.   Psychiatric/Behavioral: Negative.        Objective:   Physical Exam  Constitutional: She is oriented to person, place, and time.  HENT:  Head: Normocephalic and atraumatic.  Right Ear: External ear normal.  Left Ear: External ear normal.  Cerumen impaction  Eyes: Pupils are equal, round, and reactive to light. Conjunctivae and EOM are normal.  Neck: Normal range of motion. Neck supple.  Pulmonary/Chest: Effort normal and breath sounds normal.  Abdominal: Soft. Bowel  sounds are normal.  Neurological: She is alert and oriented to person, place, and time. She has normal reflexes.  Skin: Skin is warm.  Psychiatric: She has a normal mood and affect. Her behavior is normal. Judgment and thought content normal.     BP 117/70 (BP Location: Left Arm, Patient Position: Sitting, Cuff Size: Normal)   Pulse 86   Temp 98.7 F (37.1 C) (Oral)   Resp 16   Ht 5\' 7"  (1.702 m)   Wt 171 lb (77.6 kg)   SpO2 98%   BMI 26.78 kg/m  Assessment & Plan:  1. Hb-SS disease without crisis ([place]) Continue healthy, balanced diet.   Continue folic acid 1 mg daily to prevent aplastic bone marrow crises.   Pulmonary evaluation - Patient denies severe recurrent wheezes, shortness of breath with exercise, or persistent cough. If these symptoms develop, pulmonary function tests with spirometry will be ordered, and if abnormal, plan on referral to Pulmonology for further evaluation.  Recommend yearly dental exam  Cardiac - Routine screening for pulmonary hypertension is not recommended.  Eye - High risk of proliferative retinopathy. Annual eye exam with retinal exam recommended to patient.  Immunization status - Krista [name] is up to date with immunizations   Acute and chronic painful episodes - Patient and husband are actively trying to conceive. Recommend starting Tylenol 500 mg every 6 hours as needed for mild to moderate pain.  She typically does not use opiate medications for pain management.   Vitamin D deficiency - Will continue Vitamin D at 1000 units daily  - acetaminophen (TYLENOL) 500 MG tablet; Take 1 tablet (500 mg total) by mouth every 6 (six) hours as needed.  Dispense: 30 tablet; Refill: 0  2. Bilateral impacted cerumen Tympanic membrane visualized after lavage - Ear Lavage  RTC: 3 months for sickle cell anemia  [name]  MSN, FNP-C [facility] [address] [place], [place] [address] [phone]

## 2017-07-11 NOTE — Patient Instructions (Signed)
Sickle Cell Anemia, Adult °Sickle cell anemia is a condition where your red blood cells are shaped like sickles. Red blood cells carry oxygen through the body. Sickle-shaped red blood cells do not live as long as normal red blood cells. They also clump together and block blood from flowing through the blood vessels. These things prevent the body from getting enough oxygen. Sickle cell anemia causes organ damage and pain. It also increases the risk of infection. °Follow these instructions at home: °· Drink enough fluid to keep your pee (urine) clear or pale yellow. Drink more in hot weather and during exercise. °· Do not smoke. Smoking lowers oxygen levels in the blood. °· Only take over-the-counter or prescription medicines as told by your doctor. °· Take antibiotic medicines as told by your doctor. Make sure you finish them even if you start to feel better. °· Take supplements as told by your doctor. °· Consider wearing a medical alert bracelet. This tells anyone caring for you in an emergency of your condition. °· When traveling, keep your medical information, doctors' names, and the medicines you take with you at all times. °· If you have a fever, do not take fever medicines right away. This could cover up a problem. Tell your doctor. °· Keep all follow-up visits with your doctor. Sickle cell anemia requires regular medical care. °Contact a doctor if: °You have a fever. °Get help right away if: °· You feel dizzy or faint. °· You have new belly (abdominal) pain, especially on the left side near the stomach area. °· You have a lasting, often uncomfortable and painful erection of the penis (priapism). If it is not treated right away, you will become unable to have sex (impotence). °· You have numbness in your arms or legs or you have a hard time moving them. °· You have a hard time talking. °· You have a fever or lasting symptoms for more than 2-3 days. °· You have a fever and your symptoms suddenly get  worse. °· You have signs or symptoms of infection. These include: °? Chills. °? Being more tired than normal (lethargy). °? Irritability. °? Poor eating. °? Throwing up (vomiting). °· You have pain that is not helped with medicine. °· You have shortness of breath. °· You have pain in your chest. °· You are coughing up pus-like or bloody mucus. °· You have a stiff neck. °· Your feet or hands swell or have pain. °· Your belly looks bloated. °· Your joints hurt. °This information is not intended to replace advice given to you by your health care provider. Make sure you discuss any questions you have with your health care provider. °Document Released: 07/25/2013 Document Revised: 03/11/2016 Document Reviewed: 05/16/2013 °Elsevier Interactive Patient Education © 2017 Elsevier Inc. ° °

## 2017-09-05 ENCOUNTER — Other Ambulatory Visit: Payer: Self-pay

## 2017-09-05 DIAGNOSIS — E559 Vitamin D deficiency, unspecified: Secondary | ICD-10-CM

## 2017-09-05 MED ORDER — VITAMIN D 1000 UNITS PO TABS: 1000.0000 [IU] | ORAL_TABLET | Freq: Every day | ORAL | 11 refills | Status: DC

## 2017-10-04 LAB — OB RESULTS CONSOLE GC/CHLAMYDIA
Chlamydia: NEGATIVE
GC PROBE AMP, GENITAL: NEGATIVE

## 2017-10-04 LAB — OB RESULTS CONSOLE HIV ANTIBODY (ROUTINE TESTING): HIV: NONREACTIVE

## 2017-10-04 LAB — OB RESULTS CONSOLE HEPATITIS B SURFACE ANTIGEN: HEP B S AG: NEGATIVE

## 2017-10-04 LAB — OB RESULTS CONSOLE RUBELLA ANTIBODY, IGM: RUBELLA: IMMUNE

## 2017-10-04 LAB — OB RESULTS CONSOLE RPR: RPR: NONREACTIVE

## 2017-10-04 LAB — OB RESULTS CONSOLE ABO/RH: RH Type: POSITIVE

## 2017-10-04 LAB — OB RESULTS CONSOLE ANTIBODY SCREEN: ANTIBODY SCREEN: NEGATIVE

## 2017-10-18 NOTE — L&D Delivery Note (Signed)
Delivery Note At 2:11 PM a viable female was delivered via Vaginal, Spontaneous (Presentation: LOA).  APGAR: 8, 9; weight pending.   Placenta status: S, I. 3V Cord with the following complications: loose nuchal x 2.  Cord pH: n/a  Anesthesia:  CLEA Episiotomy: None Lacerations:  2nd degree Suture Repair: 3.0 vicryl rapide Est. Blood Loss (mL):  150  Mom to postpartum.  Baby to Couplet care / Skin to Skin.  Jerelene Salaam, [place] 05/04/2018, 2:39 PM

## 2017-10-24 ENCOUNTER — Telehealth: Payer: Self-pay

## 2017-10-24 DIAGNOSIS — D571 Sickle-cell disease without crisis: Secondary | ICD-10-CM

## 2017-10-24 MED ORDER — FOLIC ACID 1 MG PO TABS: 1.0000 mg | ORAL_TABLET | Freq: Every day | ORAL | 3 refills | Status: DC

## 2017-10-24 NOTE — Telephone Encounter (Signed)
[facility] Thanks!

## 2017-11-10 ENCOUNTER — Encounter: Payer: Self-pay | Admitting: Family Medicine

## 2017-11-10 ENCOUNTER — Ambulatory Visit (INDEPENDENT_AMBULATORY_CARE_PROVIDER_SITE_OTHER): Payer: BC Managed Care – PPO | Admitting: Family Medicine

## 2017-11-10 VITALS — BP 118/64 | HR 81 | Temp 98.3°F | Resp 16 | Ht 67.0 in | Wt 170.0 lb

## 2017-11-10 DIAGNOSIS — D571 Sickle-cell disease without crisis: Secondary | ICD-10-CM | POA: Diagnosis not present

## 2017-11-10 DIAGNOSIS — Z3492 Encounter for supervision of normal pregnancy, unspecified, second trimester: Secondary | ICD-10-CM

## 2017-11-10 DIAGNOSIS — Z3A14 14 weeks gestation of pregnancy: Secondary | ICD-10-CM | POA: Diagnosis not present

## 2017-11-10 MED ORDER — FOLIC ACID 1 MG PO TABS: 1.0000 mg | ORAL_TABLET | Freq: Every day | ORAL | 11 refills | Status: DC

## 2017-11-10 NOTE — Progress Notes (Signed)
Subjective:    Patient ID: Krista [name], female    DOB: 1986-02-17, 32 y.o.   MRN: [id number]  HPI Krista [name], a 32 year old female with a history of sickle cell anemia presents for a follow up. Patient is currently [redacted] weeks pregnant.  She is under the care of Dr. Marylynn [name], obstetrician. Patient states that she feels well and is without complaint.     She says that she has not experienced a sickle cell crisis in greater than 3 years. She does not have pain at present. She typically takes OTC Tylenol for pain related to sickle cell.  She takes folic acid and prenatal vitamins consistently. She also hydrates with 3-4 bottles of water daily.  She eats a healthy, balanced diet. She denies fatigue,  headache,  fever, shortness of breath, dysuria, nausea, or constipation.   Past Medical History:  Diagnosis Date  . Heart murmur   . History of blood transfusion   . Sickle cell anemia (HCC)    Social History   Socioeconomic History  . Marital status: Married    Spouse name: n/a  . Number of children: 0  . Years of education: 16+  . Highest education level: Not on file  Social Needs  . Financial resource strain: Not on file  . Food insecurity - worry: Not on file  . Food insecurity - inability: Not on file  . Transportation needs - medical: Not on file  . Transportation needs - non-medical: Not on file  Occupational History  . Occupation: Product manager: [employer]  Tobacco Use  . Smoking status: Never Smoker  . Smokeless tobacco: Never Used  Substance and Sexual Activity  . Alcohol use: Yes    Comment: socially  . Drug use: No  . Sexual activity: Yes  Other Topics Concern  . Not on file  Social History Narrative   ** Merged History Encounter **       Lives alone.  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    Review of Systems  HENT: Negative.   Respiratory: Negative for  shortness of breath.   Cardiovascular: Negative.   Endocrine: Negative for polydipsia, polyphagia and polyuria.  Musculoskeletal: Negative.   Skin: Negative.   Neurological: Negative.   Hematological: Negative.   Psychiatric/Behavioral: Negative.        Objective:   Physical Exam  Constitutional: She is oriented to person, place, and time. She appears well-developed and well-nourished.  HENT:  Head: Normocephalic and atraumatic.  Right Ear: External ear normal.  Left Ear: External ear normal.  Eyes: Conjunctivae and EOM are normal. Pupils are equal, round, and reactive to light.  Neck: Normal range of motion. Neck supple.  Pulmonary/Chest: Effort normal and breath sounds normal.  Abdominal: Soft. Bowel sounds are normal.  Neurological: She is alert and oriented to person, place, and time. She has normal reflexes.  Skin: Skin is warm.  Psychiatric: She has a normal mood and affect. Her behavior is normal. Judgment and thought content normal.     BP 118/64 (BP Location: Left Arm, Patient Position: Sitting, Cuff Size: Normal)   Pulse 81   Temp 98.3 F (36.8 C) (Oral)   Resp 16   Ht 5\' 7"  (1.702 m)   Wt 170 lb (77.1 kg)   LMP 08/08/2017   SpO2 99%   BMI 26.63 kg/m  Assessment & Plan:   Hb-SS disease without  crisis ([place]) Continue healthy, balanced diet.   Continue folic acid 1 mg daily to prevent aplastic bone marrow crises.   Pulmonary evaluation - Patient denies severe recurrent wheezes, shortness of breath with exercise, or persistent cough. If these symptoms develop, pulmonary function tests with spirometry will be ordered, and if abnormal, plan on referral to Pulmonology for further evaluation.  Recommend yearly dental exam  Cardiac - Routine screening for pulmonary hypertension is not recommended.  Eye - High risk of proliferative retinopathy. Annual eye exam with retinal exam recommended to patient.  Immunization status - [name] is up to date with immunizations    Acute and chronic painful episodes -  Recommend starting Tylenol 500 mg every 6 hours as needed for mild to moderate pain. She typically does not use opiate medications for pain management.   Vitamin D deficiency - Will continue Vitamin D at 1000 units daily  Second trimester pregnancy - folic acid (FOLVITE) 1 MG tablet; Take 1 tablet (1 mg total) by mouth daily.  Dispense: 30 tablet; Refill: 11   RTC: 6 months for sickle cell anemia   [name]  MSN, FNP-C [facility] [address] [place], [place] [address] [phone]

## 2017-11-10 NOTE — Patient Instructions (Signed)
Sickle Cell Anemia, Adult Sickle cell anemia is a condition where your red blood cells are shaped like sickles. Red blood cells carry oxygen through the body. Sickle-shaped red blood cells do not live as long as normal red blood cells. They also clump together and block blood from flowing through the blood vessels. These things prevent the body from getting enough oxygen. Sickle cell anemia causes organ damage and pain. It also increases the risk of infection. Follow these instructions at home:  Drink enough fluid to keep your pee (urine) clear or pale yellow. Drink more in hot weather and during exercise.  Do not smoke. Smoking lowers oxygen levels in the blood.  Only take over-the-counter or prescription medicines as told by your doctor.  Take antibiotic medicines as told by your doctor. Make sure you finish them even if you start to feel better.  Take supplements as told by your doctor.  Consider wearing a medical alert bracelet. This tells anyone caring for you in an emergency of your condition.  When traveling, keep your medical information, doctors' names, and the medicines you take with you at all times.  If you have a fever, do not take fever medicines right away. This could cover up a problem. Tell your doctor.  Keep all follow-up visits with your doctor. Sickle cell anemia requires regular medical care. Contact a doctor if: You have a fever. Get help right away if:  You feel dizzy or faint.  You have new belly (abdominal) pain, especially on the left side near the stomach area.  You have a lasting, often uncomfortable and painful erection of the penis (priapism). If it is not treated right away, you will become unable to have sex (impotence).  You have numbness in your arms or legs or you have a hard time moving them.  You have a hard time talking.  You have a fever or lasting symptoms for more than 2-3 days.  You have a fever and your symptoms suddenly get  worse.  You have signs or symptoms of infection. These include: ? Chills. ? Being more tired than normal (lethargy). ? Irritability. ? Poor eating. ? Throwing up (vomiting).  You have pain that is not helped with medicine.  You have shortness of breath.  You have pain in your chest.  You are coughing up pus-like or bloody mucus.  You have a stiff neck.  Your feet or hands swell or have pain.  Your belly looks bloated.  Your joints hurt. This information is not intended to replace advice given to you by your health care provider. Make sure you discuss any questions you have with your health care provider. Document Released: 07/25/2013 Document Revised: 03/11/2016 Document Reviewed: 05/16/2013 Elsevier Interactive Patient Education  2017 [place] for Pregnant Women While you are pregnant, your body will require additional nutrition to help support your growing baby. It is recommended that you consume:  150 additional calories each day during your first trimester.  300 additional calories each day during your second trimester.  300 additional calories each day during your third trimester.  Eating a healthy, well-balanced diet is very important for your health and for your baby's health. You also have a higher need for some vitamins and minerals, such as folic acid, calcium, iron, and vitamin D. What do I need to know about eating during pregnancy?  Do not try to lose weight or go on a diet during pregnancy.  Choose healthy, nutritious foods. Choose  of  a sandwich with a glass of milk instead of a candy bar or a high-calorie sugar-sweetened beverage.  Limit your overall intake of foods that have "empty calories." These are foods that have little nutritional value, such as sweets, desserts, candies, sugar-sweetened beverages, and fried foods.  Eat a variety of foods, especially fruits and vegetables.  Take a prenatal vitamin to help meet the additional  needs during pregnancy, specifically for folic acid, iron, calcium, and vitamin D.  Remember to stay active. Ask your health care provider for exercise recommendations that are specific to you.  Practice good food safety and cleanliness, such as washing your hands before you eat and after you prepare raw meat. This helps to prevent foodborne illnesses, such as listeriosis, that can be very dangerous for your baby. Ask your health care provider for more information about listeriosis. What does 150 extra calories look like? Healthy options for an additional 150 calories each day could be any of the following:  Plain low-fat yogurt (6-8 oz) with  cup of berries.  1 apple with 2 teaspoons of peanut butter.  Cut-up vegetables with  cup of hummus.  Low-fat chocolate milk (8 oz or 1 cup).  1 string cheese with 1 medium orange.   of a peanut butter and jelly sandwich on whole-wheat bread (1 tsp of peanut butter).  For 300 calories, you could eat two of those healthy options each day. What is a healthy amount of weight to gain? The recommended amount of weight for you to gain is based on your pre-pregnancy BMI. If your pre-pregnancy BMI was:  Less than 18 (underweight), you should gain 28-40 lb.  18-24.9 (normal), you should gain 25-35 lb.  25-29.9 (overweight), you should gain 15-25 lb.  Greater than 30 (obese), you should gain 11-20 lb.  What if I am having twins or multiples? Generally, pregnant women who will be having twins or multiples may need to increase their daily calories by 300-600 calories each day. The recommended range for total weight gain is 25-54 lb, depending on your pre-pregnancy BMI. Talk with your health care provider for specific guidance about additional nutritional needs, weight gain, and exercise during your pregnancy. What foods can I eat? Grains Any grains. Try to choose whole grains, such as whole-wheat bread, oatmeal, or brown rice. Vegetables Any  vegetables. Try to eat a variety of colors and types of vegetables to get a full range of vitamins and minerals. Remember to wash your vegetables well before eating. Fruits Any fruits. Try to eat a variety of colors and types of fruit to get a full range of vitamins and minerals. Remember to wash your fruits well before eating. Meats and Other Protein Sources Lean meats, including chicken, [place], fish, and lean cuts of beef, veal, or pork. Make sure that all meats are cooked to "well done." Tofu. Tempeh. Beans. Eggs. Peanut butter and other nut butters. Seafood, such as shrimp, crab, and lobster. If you choose fish, select types that are higher in omega-3 fatty acids, including salmon, herring, mussels, trout, sardines, and pollock. Make sure that all meats are cooked to food-safe temperatures. Dairy Pasteurized milk and milk alternatives. Pasteurized yogurt and pasteurized cheese. Cottage cheese. Sour cream. Beverages Water. Juices that contain 100% fruit juice or vegetable juice. Caffeine-free teas and decaffeinated coffee. Drinks that contain caffeine are okay to drink, but it is better to avoid caffeine. Keep your total caffeine intake to less than 200 mg each day (12 oz of coffee, tea, or soda)  or as directed by your health care provider. Condiments Any pasteurized condiments. Sweets and Desserts Any sweets and desserts. Fats and Oils Any fats and oils. The items listed above may not be a complete list of recommended foods or beverages. Contact your dietitian for more options. What foods are not recommended? Vegetables Unpasteurized (raw) vegetable juices. Fruits Unpasteurized (raw) fruit juices. Meats and Other Protein Sources Cured meats that have nitrates, such as bacon, salami, and hotdogs. Luncheon meats, bologna, or other deli meats (unless they are reheated until they are steaming hot). Refrigerated pate, meat spreads from a meat counter, smoked seafood that is found in the  refrigerated section of a store. Raw fish, such as sushi or sashimi. High mercury content fish, such as tilefish, shark, swordfish, and king mackerel. Raw meats, such as tuna or beef tartare. Undercooked meats and poultry. Make sure that all meats are cooked to food-safe temperatures. Dairy Unpasteurized (raw) milk and any foods that have raw milk in them. Soft cheeses, such as feta, queso blanco, queso fresco, Brie, Camembert cheeses, blue-veined cheeses, and Panela cheese (unless it is made with pasteurized milk, which must be stated on the label). Beverages Alcohol. Sugar-sweetened beverages, such as sodas, teas, or energy drinks. Condiments Homemade fermented foods and drinks, such as pickles, sauerkraut, or kombucha drinks. (Store-bought pasteurized versions of these are okay.) Other Salads that are made in the store, such as ham salad, chicken salad, egg salad, tuna salad, and seafood salad. The items listed above may not be a complete list of foods and beverages to avoid. Contact your dietitian for more information. This information is not intended to replace advice given to you by your health care provider. Make sure you discuss any questions you have with your health care provider. Document Released: 07/19/2014 Document Revised: 03/11/2016 Document Reviewed: 03/19/2014 Elsevier Interactive Patient Education  [employer].

## 2017-12-28 ENCOUNTER — Ambulatory Visit (HOSPITAL_COMMUNITY)
Admission: EM | Admit: 2017-12-28 | Discharge: 2017-12-28 | Disposition: A | Discharge status: Home / Self Care | Payer: BC Managed Care – PPO | Attending: Family Medicine | Admitting: Family Medicine

## 2017-12-28 ENCOUNTER — Encounter (HOSPITAL_COMMUNITY): Payer: Self-pay | Admitting: Emergency Medicine

## 2017-12-28 ENCOUNTER — Other Ambulatory Visit: Payer: Self-pay

## 2017-12-28 ENCOUNTER — Telehealth (HOSPITAL_COMMUNITY): Payer: Self-pay | Admitting: Emergency Medicine

## 2017-12-28 DIAGNOSIS — J019 Acute sinusitis, unspecified: Secondary | ICD-10-CM | POA: Diagnosis not present

## 2017-12-28 MED ORDER — AMOXICILLIN-POT CLAVULANATE 875-125 MG PO TABS: 1.0000 | ORAL_TABLET | Freq: Two times a day (BID) | ORAL | 0 refills | Status: AC

## 2017-12-28 NOTE — Telephone Encounter (Signed)
Spoke with pharmacist at [facility], [place].  hallie wieters, pa reviewed pharmacy question in regards to cephalsporin allergy and prescribing of generic augmentin.    Hallie, PA spoke to patient and was aware of cephalosporin allergy and patient and provider discussed this and med is to be given to patient as ordered

## 2017-12-28 NOTE — ED Triage Notes (Signed)
Pt reports sinus congestion and pressure, bilateral ear pain and some mild tenderness in her throat for a little over one week.  Pt taking Tylenol and Cetirizine daily.

## 2017-12-28 NOTE — ED Provider Notes (Signed)
[place]    CSN: [id number] Arrival date & time: 12/28/17  1138     History   Chief Complaint Chief Complaint  Patient presents with  . sinus pressure    HPI Krista [name] is a 32 y.o. female [redacted] weeks pregnant presenting today with congestion, facial pressure, bilateral ear pain, sore throat for over a week.  She has also had decreased appetite and mild nausea, denies vomiting, abdominal pain or diarrhea.  Patient has been taking Tylenol and daily Zyrtec.  Also using Afrin, has been using this for about 5-6 days.  Denies shortness of breath or chest pain.   HPI  Past Medical History:  Diagnosis Date  . Heart murmur   . History of blood transfusion   . Sickle cell anemia [facility])     Patient Active Problem List   Diagnosis Date Noted  . Amenorrhea 06/11/2017  . Cellulitis of left upper arm 01/11/2017  . 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 Thrombocytosis ([place]) 08/12/2015  . Gastroesophageal reflux disease without esophagitis 08/12/2015  . Sickle cell anemia ([place]) 02/03/2013    Past Surgical History:  Procedure Laterality Date  . CHOLECYSTECTOMY      OB History    Gravida Para Term Preterm AB Living   1             SAB TAB Ectopic Multiple Live Births                   Home Medications    Prior to Admission medications   Medication Sig Start Date End Date Taking? Authorizing Provider  acetaminophen (TYLENOL) 500 MG tablet Take 1 tablet (500 mg total) by mouth every 6 (six) hours as needed. 07/11/17  Yes Dorena Dew, FNP  cetirizine (ZYRTEC) 10 MG tablet Take 1 tablet (10 mg total) by mouth daily. 01/10/17  Yes Dorena Dew, FNP  cholecalciferol (VITAMIN D) 1000 units tablet Take 1 tablet (1,000 Units total) daily by mouth. 09/05/17  Yes Dorena Dew, FNP  folic acid (FOLVITE) 1 MG tablet Take 1 tablet (1 mg total) by mouth daily. 11/10/17  Yes Dorena Dew, FNP  Prenatal Vit-Fe Fumarate-FA (PRENATAL MULTIVITAMIN) TABS  tablet Take 1 tablet by mouth daily at 12 noon.   Yes [provider]  amoxicillin-clavulanate (AUGMENTIN) 875-125 MG tablet Take 1 tablet by mouth every 12 (twelve) hours for 10 days. 12/28/17 01/07/18  Wieters, Hallie C, PA-C  mometasone (ELOCON) 0.1 % cream Apply 1 application topically daily. 10/15/16   Dorena Dew, FNP  ondansetron (ZOFRAN-ODT) 8 MG disintegrating tablet Take 1 tablet (8 mg total) by mouth every 8 (eight) hours as needed for nausea. Patient not taking: Reported on 11/10/2017 02/03/13   Harrison Mons, PA-C    Family History Family History  Problem Relation Age of Onset  . Diabetes Father   . Hypertension Father   . Heart disease Brother   . Cancer Maternal Grandmother   . Heart disease Brother     Social History Social History   Tobacco Use  . Smoking status: Never Smoker  . Smokeless tobacco: Never Used  Substance Use Topics  . Alcohol use: Yes    Comment: socially  . Drug use: No     Allergies   Keflex [cephalexin]   Review of Systems Review of Systems  Constitutional: Negative for chills, fatigue and fever.  HENT: Positive for congestion, ear pain, rhinorrhea, sinus pressure and sore throat. Negative for trouble swallowing.  Respiratory: Positive for cough. Negative for chest tightness and shortness of breath.   Cardiovascular: Negative for chest pain.  Gastrointestinal: Positive for nausea. Negative for abdominal pain and vomiting.  Musculoskeletal: Negative for myalgias.  Skin: Negative for rash.  Neurological: Negative for dizziness, light-headedness and headaches.     Physical Exam Triage Vital Signs ED Triage Vitals  Enc Vitals Group     BP 12/28/17 1252 133/70     Pulse Rate 12/28/17 1252 (!) 108     Resp --      Temp 12/28/17 1252 98.5 F (36.9 C)     Temp Source 12/28/17 1252 Oral     SpO2 12/28/17 1252 99 %     Weight --      Height --      Head Circumference --      Peak Flow --      Pain Score 12/28/17 1253  5     Pain Loc --      Pain Edu? --      Excl. in [place]? --    No data found.  Updated Vital Signs BP 133/70 (BP Location: Left Arm)   Pulse (!) 108   Temp 98.5 F (36.9 C) (Oral)   LMP 08/08/2017   SpO2 99%   Visual Acuity Right Eye Distance:   Left Eye Distance:   Bilateral Distance:    Right Eye Near:   Left Eye Near:    Bilateral Near:     Physical Exam  Constitutional: She appears well-developed and well-nourished. No distress.  HENT:  Head: Normocephalic and atraumatic.  Bilateral TMs nonerythematous, nasal mucosa erythematous with rhinorrhea present, posterior oropharynx erythematous with no tonsillar enlargement or exudate.  Eyes: Conjunctivae are normal.  Neck: Neck supple.  Cardiovascular: Normal rate and regular rhythm.  No murmur heard. Pulmonary/Chest: Effort normal and breath sounds normal. No respiratory distress.  Breathing comfortably at rest, CTA BL  Abdominal: Soft. There is no tenderness.  Musculoskeletal: She exhibits no edema.  Neurological: She is alert.  Skin: Skin is warm and dry.  Psychiatric: She has a normal mood and affect.  Nursing note and vitals reviewed.    UC Treatments / Results  Labs (all labs ordered are listed, but only abnormal results are displayed) Labs Reviewed - No data to display  EKG  EKG Interpretation None       Radiology No results found.  Procedures Procedures (including critical care time)  Medications Ordered in UC Medications - No data to display   Initial Impression / Assessment and Plan / UC Course  I have reviewed the triage vital signs and the nursing notes.  Pertinent labs & imaging results that were available during my care of the patient were reviewed by me and considered in my medical decision making (see chart for details).     Vital signs stable, no fever, oxygen 9 9%.  Patient is tachycardic at 108, likely normal due to pregnancy.  Symptoms appeared to be from sinusitis, and given  length of symptoms has been over a week, will provide Augmentin given this is safe in pregnancy.  Provided list of over-the-counter measures she may try that are safe for pregnancy. Discussed strict return precautions. Patient verbalized understanding and is agreeable with plan.   Final Clinical Impressions(s) / UC Diagnoses   Final diagnoses:  Acute sinusitis with symptoms > 10 days    ED Discharge Orders        Ordered    amoxicillin-clavulanate (AUGMENTIN) 875-125 MG  tablet  Every 12 hours     12/28/17 1333       Controlled Substance Prescriptions [place] Controlled Substance Registry consulted? Not Applicable   Janith Lima, [place] 12/28/17 1343

## 2017-12-28 NOTE — Discharge Instructions (Signed)
Please begin augmentin twice daily for the next 10 days.   Please use list of recommended medicines for symptom management as the antibiotic begins to work.

## 2018-01-04 ENCOUNTER — Other Ambulatory Visit: Payer: Self-pay | Admitting: Family Medicine

## 2018-01-04 DIAGNOSIS — L209 Atopic dermatitis, unspecified: Secondary | ICD-10-CM

## 2018-01-16 ENCOUNTER — Other Ambulatory Visit: Payer: Self-pay | Admitting: Family Medicine

## 2018-01-16 DIAGNOSIS — L299 Pruritus, unspecified: Secondary | ICD-10-CM

## 2018-04-28 ENCOUNTER — Observation Stay (HOSPITAL_COMMUNITY)
Admission: AD | Admit: 2018-04-28 | Discharge: 2018-04-29 | Disposition: A | Discharge status: Home / Self Care | Payer: BC Managed Care – PPO | Source: Ambulatory Visit | Attending: Obstetrics and Gynecology | Admitting: Obstetrics and Gynecology

## 2018-04-28 ENCOUNTER — Telehealth (HOSPITAL_COMMUNITY): Payer: Self-pay | Admitting: General Practice

## 2018-04-28 ENCOUNTER — Other Ambulatory Visit: Payer: Self-pay

## 2018-04-28 ENCOUNTER — Inpatient Hospital Stay (HOSPITAL_COMMUNITY): Payer: BC Managed Care – PPO

## 2018-04-28 ENCOUNTER — Encounter (HOSPITAL_COMMUNITY): Payer: Self-pay | Admitting: *Deleted

## 2018-04-28 DIAGNOSIS — Z87891 Personal history of nicotine dependence: Secondary | ICD-10-CM | POA: Diagnosis not present

## 2018-04-28 DIAGNOSIS — Z3A38 38 weeks gestation of pregnancy: Secondary | ICD-10-CM | POA: Diagnosis not present

## 2018-04-28 DIAGNOSIS — O99013 Anemia complicating pregnancy, third trimester: Principal | ICD-10-CM

## 2018-04-28 DIAGNOSIS — D571 Sickle-cell disease without crisis: Secondary | ICD-10-CM | POA: Diagnosis present

## 2018-04-28 DIAGNOSIS — D5700 Hb-SS disease with crisis, unspecified: Secondary | ICD-10-CM | POA: Diagnosis not present

## 2018-04-28 DIAGNOSIS — Z79899 Other long term (current) drug therapy: Secondary | ICD-10-CM | POA: Insufficient documentation

## 2018-04-28 LAB — COMPREHENSIVE METABOLIC PANEL
ALK PHOS: 149 U/L — AB (ref 38–126)
ALT: 26 U/L (ref 0–44)
ANION GAP: 9 (ref 5–15)
AST: 43 U/L — ABNORMAL HIGH (ref 15–41)
Albumin: 2.9 g/dL — ABNORMAL LOW (ref 3.5–5.0)
BUN: 5 mg/dL — ABNORMAL LOW (ref 6–20)
CALCIUM: 8.8 mg/dL — AB (ref 8.9–10.3)
CO2: 18 mmol/L — AB (ref 22–32)
CREATININE: 0.48 mg/dL (ref 0.44–1.00)
Chloride: 108 mmol/L (ref 98–111)
Glucose, Bld: 78 mg/dL (ref 70–99)
Potassium: 4.4 mmol/L (ref 3.5–5.1)
SODIUM: 135 mmol/L (ref 135–145)
TOTAL PROTEIN: 6.2 g/dL — AB (ref 6.5–8.1)
Total Bilirubin: 2.5 mg/dL — ABNORMAL HIGH (ref 0.3–1.2)

## 2018-04-28 LAB — CBC
HCT: 24 % — ABNORMAL LOW (ref 36.0–46.0)
HEMATOCRIT: 23.4 % — AB (ref 36.0–46.0)
HEMOGLOBIN: 8 g/dL — AB (ref 12.0–15.0)
HEMOGLOBIN: 7.9 g/dL — AB (ref 12.0–15.0)
MCH: 34.8 pg — AB (ref 26.0–34.0)
MCH: 35.3 pg — AB (ref 26.0–34.0)
MCHC: 33.3 g/dL (ref 30.0–36.0)
MCHC: 33.8 g/dL (ref 30.0–36.0)
MCV: 104.3 fL — AB (ref 78.0–100.0)
MCV: 104.5 fL — ABNORMAL HIGH (ref 78.0–100.0)
Platelets: 563 10*3/uL — ABNORMAL HIGH (ref 150–400)
Platelets: 493 10*3/uL — ABNORMAL HIGH (ref 150–400)
RBC: 2.3 MIL/uL — ABNORMAL LOW (ref 3.87–5.11)
RBC: 2.24 MIL/uL — ABNORMAL LOW (ref 3.87–5.11)
RDW: 22.3 % — ABNORMAL HIGH (ref 11.5–15.5)
RDW: 21.9 % — ABNORMAL HIGH (ref 11.5–15.5)
WBC: 19.4 10*3/uL — ABNORMAL HIGH (ref 4.0–10.5)
WBC: 17.4 10*3/uL — ABNORMAL HIGH (ref 4.0–10.5)

## 2018-04-28 LAB — CREATININE, SERUM
Creatinine, Ser: 0.44 mg/dL (ref 0.44–1.00)
GFR calc Af Amer: >60 mL/min (ref >60)

## 2018-04-28 LAB — URINALYSIS, ROUTINE W REFLEX MICROSCOPIC
Bilirubin Urine: NEGATIVE
Glucose, UA: NEGATIVE mg/dL
Hgb urine dipstick: NEGATIVE
KETONES UR: NEGATIVE mg/dL
Leukocytes, UA: NEGATIVE
Nitrite: NEGATIVE
Protein, ur: 30 mg/dL — AB
Specific Gravity, Urine: 1.011 (ref 1.005–1.030)
pH: 7 (ref 5.0–8.0)

## 2018-04-28 LAB — ABO/RH: ABO/RH(D): B POS

## 2018-04-28 LAB — TYPE AND SCREEN
ABO/RH(D): B POS
Antibody Screen: NEGATIVE

## 2018-04-28 MED ORDER — OXYCODONE-ACETAMINOPHEN 5-325 MG PO TABS: 1.0000 | ORAL_TABLET | Freq: Four times a day (QID) | ORAL | Status: DC | PRN

## 2018-04-28 MED ORDER — HYDROMORPHONE HCL 1 MG/ML IJ SOLN
1.0000 mg | Freq: Once | INTRAMUSCULAR | Status: AC
  Administered 2018-04-28: 1 mg via INTRAVENOUS
  Filled 2018-04-28: qty 1

## 2018-04-28 MED ORDER — DEXTROSE-NACL 5-0.45 % IV SOLN: INTRAVENOUS | Status: DC

## 2018-04-28 MED ORDER — HYDROMORPHONE HCL 1 MG/ML IJ SOLN
0.5000 mg | INTRAMUSCULAR | Status: DC | PRN
  Administered 2018-04-28: 0.5 mg via INTRAVENOUS
  Filled 2018-04-28: qty 0.5

## 2018-04-28 MED ORDER — DOCUSATE SODIUM 100 MG PO CAPS: 100.0000 mg | ORAL_CAPSULE | Freq: Every day | ORAL | Status: DC

## 2018-04-28 MED ORDER — ZOLPIDEM TARTRATE 5 MG PO TABS: 5.0000 mg | ORAL_TABLET | Freq: Every evening | ORAL | Status: DC | PRN

## 2018-04-28 MED ORDER — ONDANSETRON HCL 4 MG/2ML IJ SOLN
4.0000 mg | Freq: Once | INTRAMUSCULAR | Status: AC
  Administered 2018-04-28: 4 mg via INTRAVENOUS
  Filled 2018-04-28: qty 2

## 2018-04-28 MED ORDER — LACTATED RINGERS IV BOLUS
1000.0000 mL | Freq: Once | INTRAVENOUS | Status: AC
  Administered 2018-04-28: 1000 mL via INTRAVENOUS

## 2018-04-28 MED ORDER — ACETAMINOPHEN 325 MG PO TABS
650.0000 mg | ORAL_TABLET | ORAL | Status: DC | PRN
  Administered 2018-04-29: 650 mg via ORAL
  Filled 2018-04-28: qty 2

## 2018-04-28 MED ORDER — ONDANSETRON HCL 4 MG/2ML IJ SOLN
4.0000 mg | Freq: Four times a day (QID) | INTRAMUSCULAR | Status: DC | PRN
  Administered 2018-04-28: 4 mg via INTRAVENOUS
  Filled 2018-04-28: qty 2

## 2018-04-28 MED ORDER — LACTATED RINGERS IV SOLN
INTRAVENOUS | Status: DC
  Administered 2018-04-28 – 2018-04-29 (×3): via INTRAVENOUS

## 2018-04-28 MED ORDER — ENOXAPARIN SODIUM 40 MG/0.4ML ~~LOC~~ SOLN
40.0000 mg | SUBCUTANEOUS | Status: DC
  Administered 2018-04-28: 40 mg via SUBCUTANEOUS
  Filled 2018-04-28: qty 0.4

## 2018-04-28 MED ORDER — CALCIUM CARBONATE ANTACID 500 MG PO CHEW: 2.0000 | CHEWABLE_TABLET | ORAL | Status: DC | PRN

## 2018-04-28 MED ORDER — PRENATAL MULTIVITAMIN CH: 1.0000 | ORAL_TABLET | Freq: Every day | ORAL | Status: DC

## 2018-04-28 NOTE — H&P (Signed)
Krista [name] is a 32 y.o. female presenting for sickle cell crisis. H/o SSA, followed by Dr. [place] Hollis. No episodes x many years. Pain began today, typical of episodes. See MAU notes for full HPI. No respiratory symptoms or fever. OB History    Gravida  1   Para      Term      Preterm      AB      Living        SAB      TAB      Ectopic      Multiple      Live Births             Past Medical History:  Diagnosis Date  . Heart murmur   . History of blood transfusion   . Sickle cell anemia (HCC)    Past Surgical History:  Procedure Laterality Date  . CHOLECYSTECTOMY     Family History: family history includes Cancer in her maternal grandmother; Diabetes in her father; Heart disease in her brother and brother; Hypertension in her father. Social History:  reports that she has quit smoking. Her smoking use included cigarettes. She has never used smokeless tobacco. She reports that she drank alcohol. She reports that she does not use drugs.     Maternal Diabetes: No Genetic Screening: Normal Maternal Ultrasounds/Referrals: Normal Fetal Ultrasounds or other Referrals:  None Maternal Substance Abuse:  No Significant Maternal Medications:  None Significant Maternal Lab Results:  None Other Comments:  None  ROS History   Blood pressure 125/87, pulse 91, temperature 98.5 F (36.9 C), temperature source Oral, resp. rate 16, weight 79.3 kg (174 lb 12 oz), last menstrual period 08/08/2017, SpO2 92 %. Exam Physical Exam  NAD, A&O NWOB, clear lungs Abd soft, nondistended, gravid SVE deferred given no pelvic pain/c/o  Prenatal labs: ABO, Rh:   Antibody:   Rubella:   RPR:    HBsAg:    HIV:    GBS:     Assessment/Plan: 32 yo G1P0 @ 38.1wga p/w SS pain. Pain c/w IV dilaudid. Plan admit for pain control, IVF, CEFM, and consultation w/SS team. Pt requiring 2-4L O2 but improving, CXR w/out radiodensities and pt w/out any s/s c/f acute chest syndrome.    -  IVF - repeat CXR prn (2014 WNL, now new onset cardio enlargement poss s/s pregnancy, plan repeat pp). D/w patient.  - pain control and ss care per sickle cell team - lovenox for DVT proph - hgb 8.0 - monitor closely, transfusion prn, no active bleeding - No OB c/o - CEFM, active fetus   Tyson Dense 04/28/2018, 4:07 PM

## 2018-04-28 NOTE — MAU Provider Note (Signed)
[place]     CSN: [id number]  Arrival date and time: 04/28/18 3785   First Provider Initiated Contact with Patient 04/28/18 0946      Chief Complaint  Patient presents with  . Sickle Cell Pain Crisis   Krista [name] is a 32 y.o. G1P0 at [redacted]w[redacted]d who presents today with SS pain episode that started around 0100 today. She denies any contractions, VB or LOF. She reports normal fetal movement. She denies any complications with this pregnancy. She is unsure of her triggers as she has not had an acute pain episode since 2008. She reports pain in her elbow, wrists, shoulders and back. She rates her pain 8/10 at this time.   Sickle Cell Pain Crisis  This is a new problem. The current episode started today. The problem has been unchanged. Pertinent negatives include no chills, fever, nausea or vomiting. Nothing aggravates the symptoms. She has tried nothing for the symptoms.   Past Medical History:  Diagnosis Date  . Heart murmur   . History of blood transfusion   . Sickle cell anemia (HCC)     Past Surgical History:  Procedure Laterality Date  . CHOLECYSTECTOMY      Family History  Problem Relation Age of Onset  . Diabetes Father   . Hypertension Father   . Heart disease Brother   . Cancer Maternal Grandmother   . Heart disease Brother     Social History   Tobacco Use  . Smoking status: Former Smoker    Types: Cigarettes  . Smokeless tobacco: Never Used  Substance Use Topics  . Alcohol use: Not Currently    Comment: socially  . Drug use: No    Allergies:  Allergies  Allergen Reactions  . Keflex [Cephalexin] Swelling    Medications Prior to Admission  Medication Sig Dispense Refill Last Dose  . acetaminophen (TYLENOL) 500 MG tablet Take 1 tablet (500 mg total) by mouth every 6 (six) hours as needed. 30 tablet 0 12/28/2017 at Unknown time  . cetirizine (ZYRTEC) 10 MG tablet TAKE 1 TABLET BY MOUTH ONCE DAILY 30 tablet 11   . cholecalciferol (VITAMIN D) 1000 units tablet  Take 1 tablet (1,000 Units total) daily by mouth. 30 tablet 11 12/28/2017 at Unknown time  . folic acid (FOLVITE) 1 MG tablet Take 1 tablet (1 mg total) by mouth daily. 30 tablet 11 12/28/2017 at Unknown time  . mometasone (ELOCON) 0.1 % cream APPLY ONE APPLICATION TOPICALLY DAILY 45 g 0   . ondansetron (ZOFRAN-ODT) 8 MG disintegrating tablet Take 1 tablet (8 mg total) by mouth every 8 (eight) hours as needed for nausea. (Patient not taking: Reported on 11/10/2017) 30 tablet 0 Not Taking  . Prenatal Vit-Fe Fumarate-FA (PRENATAL MULTIVITAMIN) TABS tablet Take 1 tablet by mouth daily at 12 noon.   12/28/2017 at Unknown time    Review of Systems  Constitutional: Negative for chills and fever.  Gastrointestinal: Negative for nausea and vomiting.  Genitourinary: Negative for dysuria, frequency and vaginal bleeding.   Physical Exam   Blood pressure 125/87, pulse 91, temperature 98.5 F (36.9 C), temperature source Oral, resp. rate 16, weight 174 lb 12 oz (79.3 kg), last menstrual period 08/08/2017, SpO2 98 %.  Physical Exam  Nursing note and vitals reviewed. Constitutional: She is oriented to person, place, and time. She appears well-developed and well-nourished. No distress.  HENT:  Head: Normocephalic.  Cardiovascular: Normal rate.  Respiratory: Effort normal.  GI: Soft. There is no tenderness.  Neurological: She is alert  and oriented to person, place, and time.  Skin: Skin is warm and dry.  Psychiatric: She has a normal mood and affect.   NST:  Baseline: 140 Variability: moderate Accels: 15x15 Decels: none Toco: none   Results for orders placed or performed during the hospital encounter of 04/28/18 (from the past 24 hour(s))  Urinalysis, Routine w reflex microscopic     Status: Abnormal   Collection Time: 04/28/18  9:42 AM  Result Value Ref Range   Color, Urine YELLOW YELLOW   APPearance CLEAR CLEAR   Specific Gravity, Urine 1.011 1.005 - 1.030   pH 7.0 5.0 - 8.0   Glucose, UA  NEGATIVE NEGATIVE mg/dL   Hgb urine dipstick NEGATIVE NEGATIVE   Bilirubin Urine NEGATIVE NEGATIVE   Ketones, ur NEGATIVE NEGATIVE mg/dL   Protein, ur 30 (A) NEGATIVE mg/dL   Nitrite NEGATIVE NEGATIVE   Leukocytes, UA NEGATIVE NEGATIVE   RBC / HPF 0-5 0 - 5 RBC/hpf   WBC, UA 0-5 0 - 5 WBC/hpf   Bacteria, UA RARE (A) NONE SEEN   Squamous Epithelial / LPF 0-5 0 - 5   Mucus PRESENT   CBC     Status: Abnormal   Collection Time: 04/28/18 10:00 AM  Result Value Ref Range   WBC 19.4 (H) 4.0 - 10.5 K/uL   RBC 2.30 (L) 3.87 - 5.11 MIL/uL   Hemoglobin 8.0 (L) 12.0 - 15.0 g/dL   HCT 24.0 (L) 36.0 - 46.0 %   MCV 104.3 (H) 78.0 - 100.0 fL   MCH 34.8 (H) 26.0 - 34.0 pg   MCHC 33.3 30.0 - 36.0 g/dL   RDW 22.3 (H) 11.5 - 15.5 %   Platelets 563 (H) 150 - 400 K/uL  Comprehensive metabolic panel     Status: Abnormal   Collection Time: 04/28/18 10:00 AM  Result Value Ref Range   Sodium 135 135 - 145 mmol/L   Potassium 4.4 3.5 - 5.1 mmol/L   Chloride 108 98 - 111 mmol/L   CO2 18 (L) 22 - 32 mmol/L   Glucose, Bld 78 70 - 99 mg/dL   BUN 5 (L) 6 - 20 mg/dL   Creatinine, Ser 0.48 0.44 - 1.00 mg/dL   Calcium 8.8 (L) 8.9 - 10.3 mg/dL   Total Protein 6.2 (L) 6.5 - 8.1 g/dL   Albumin 2.9 (L) 3.5 - 5.0 g/dL   AST 43 (H) 15 - 41 U/L   ALT 26 0 - 44 U/L   Alkaline Phosphatase 149 (H) 38 - 126 U/L   Total Bilirubin 2.5 (H) 0.3 - 1.2 mg/dL   GFR calc non Af Amer >60 >60 mL/min   GFR calc Af Amer >60 >60 mL/min   Anion gap 9 5 - 15   Dg Chest 2 View  Result Date: 04/28/2018 CLINICAL DATA:  Sickle cell anemia. Thirty-eight weeks pregnant. Shortness of breath, chest pain and vomiting. EXAM: CHEST - 2 VIEW COMPARISON:  02/05/2013 FINDINGS: Cardiac enlargement noted. No pleural effusion or edema. No airspace opacities identified. The visualized osseous structures are unremarkable. IMPRESSION: No active cardiopulmonary disease. Cardiac enlargement. Electronically Signed   By: Kerby Moors M.D.   On:  04/28/2018 12:54   MAU Course  Procedures  MDM  12:12 PM Consult with Dr. Royston Sinner. Discussed patient's current complaint and treatment with O2, IV fluids and dilaudid. Patient still having pain at this time, and will give a 2nd dose of dilaudid. Due to the need for O2 will get chest xray at this  time.   1435: O2 had been turned down to 2L from 4L. She has been maintaining adequate O2 sat. Will stop O2 at this time to see if she can maintain 02 sat.   1558: O2 still off at this time. Sat 89-92 on RA. Patient has vomited twice. Will give zofran. Dr. Royston Sinner notified. Plan will be to admit patient. Patient sees [place] Hollis, and she is on call at this time. Dr. Royston Sinner will consult with her for admission.   Assessment and Plan   1. Vaso-occlusive pain due to sickle cell disease ([place])   2. [redacted] weeks gestation of pregnancy    Admit to 3rd floor for pain management   Marcille Buffy 04/28/2018, 9:49 AM

## 2018-04-28 NOTE — Consult Note (Signed)
  Anesthesia Pain Consult Note  Patient: Krista [name], 32 y.o., female  Consult Requested by: Tyson Dense, MD  Reason for Consult: Sickle cell crisis Subjective:  Krista [name], a very pleasant 32 year old female with a history of sickle cell anemia, HbSS and third trimester pregnancy presented to MAU this am in sickle cell crisis. Patient is currently [redacted] weeks pregnant. She has infrequent sickle cell crises and is opiate naive. Patient's sickle cell anemia has been well managed and she has not experience a sickle cell crisis since 2009. Patient currently denies pain. She last had 1 mg of IV dilaudid 4 hours ago with maximum relief. Patient currently endorses nausea and has vomited several times. She denies headache, dyspnea, chest pain, paresthesias, or dysuria.   Pain: No pain at present  Last Vitals:  Vitals:   04/28/18 1524 04/28/18 1636  BP:  113/69  Pulse:  71  Resp:  18  Temp:  98.1 F (36.7 C)  SpO2: 92% 95%   Sickle cell pain crisis:  Continue IVFs at 50 ml/hr.  Dilaudid 0.5 mg every 4 hours as needed for moderate to severe pain.  Percocet 5-325 mg every 6 hours as needed for moderate to severe pain Continue supportive care No pulmonary abnormalities noted on CXR, cardiomegaly noted.   Anemia of chronic disease:  Hemoglobin is 7.9, no transfusion warranted at this time. Patient's baseline is 8-9. No active bleeding. Will repeat CBC in am.   Leukocytosis:  WBC is 17.4, most likely reactive. No signs of infection       Risks of wet tap, epidural hematoma and spinal cord injury explained to:    Allergies  Allergen Reactions  . Keflex [Cephalexin] Swelling   Physical Exam  Constitutional: She appears well-developed and well-nourished.  Eyes: Pupils are equal, round, and reactive to light.  Neck: Normal range of motion.  Cardiovascular: Normal rate and regular rhythm.  Pulmonary/Chest: Effort normal and breath sounds normal.  Neurological: She is  alert.  Skin: Skin is warm and dry.  Psychiatric: She has a normal mood and affect. Her behavior is normal. Thought content normal.     I have reviewed the patient's medications listed below. Derrill Memo ON 04/29/2018] docusate sodium  100 mg Oral Daily  . enoxaparin (LOVENOX) injection  40 mg Subcutaneous Q24H  . [START ON 04/29/2018] prenatal multivitamin  1 tablet Oral Q1200   . dextrose 5 % and 0.45% NaCl    . lactated ringers 125 mL/hr at 04/28/18 1558   acetaminophen, calcium carbonate, ondansetron (ZOFRAN) IV, zolpidem  Past Medical History:  Diagnosis Date  . Heart murmur   . History of blood transfusion   . Sickle cell anemia (HCC)    Past Surgical History:  Procedure Laterality Date  . CHOLECYSTECTOMY      reports that she has quit smoking. Her smoking use included cigarettes. She has never used smokeless tobacco. She reports that she drank alcohol. She reports that she does not use drugs.    Krista Pounds  MSN, FNP-C [facility] [address] [place], [place] [address] [phone]  04/28/2018

## 2018-04-28 NOTE — Telephone Encounter (Signed)
Patient called, complained of generalized pain  that she rated as 10/10. Denied, fever and chest pain. Admitted occasional palpitations whenever her chest hurts, denied vomiting/diarrhea, admitted abdominal pain in the middle portion. Last took pain medications many months ago because she is [redacted] weeks pregnant.  Provider notified; per provider, patient cannot come to this center. Patient should go to [facility] hospital. Provider said she will be following up with the patient as well. Patient notified, verbalized understanding.

## 2018-04-28 NOTE — Plan of Care (Signed)
  Problem: Pain Managment: Goal: General experience of comfort will improve Outcome: Progressing   Problem: Safety: Goal: Ability to remain free from injury will improve Outcome: Progressing   Problem: Education: Goal: Knowledge of disease or condition will improve Outcome: Progressing   Problem: Pain Management: Goal: Relief or control of pain will improve Outcome: Progressing

## 2018-04-28 NOTE — MAU Note (Signed)
Patient states pain started at 0100.  Generalized pain all over.  Denies chest pain.  Rates pain 8/10.  Has not taken any medications for pain.  Reports good fetal movement.  Denies LOF or vaginal bleeding.

## 2018-04-29 DIAGNOSIS — O99013 Anemia complicating pregnancy, third trimester: Secondary | ICD-10-CM | POA: Diagnosis not present

## 2018-04-29 DIAGNOSIS — D5700 Hb-SS disease with crisis, unspecified: Secondary | ICD-10-CM | POA: Diagnosis not present

## 2018-04-29 LAB — CBC
HCT: 20.7 % — ABNORMAL LOW (ref 36.0–46.0)
Hemoglobin: 7 g/dL — ABNORMAL LOW (ref 12.0–15.0)
MCH: 35.5 pg — AB (ref 26.0–34.0)
MCHC: 33.8 g/dL (ref 30.0–36.0)
MCV: 105.1 fL — AB (ref 78.0–100.0)
PLATELETS: 499 10*3/uL — AB (ref 150–400)
RBC: 1.97 MIL/uL — ABNORMAL LOW (ref 3.87–5.11)
RDW: 22.5 % — AB (ref 11.5–15.5)
WBC: 15.1 10*3/uL — ABNORMAL HIGH (ref 4.0–10.5)

## 2018-04-29 MED ORDER — OXYCODONE-ACETAMINOPHEN 5-325 MG PO TABS: 1.0000 | ORAL_TABLET | Freq: Four times a day (QID) | ORAL | 0 refills | Status: DC | PRN

## 2018-04-29 MED ORDER — ONDANSETRON HCL 4 MG PO TABS: 4.0000 mg | ORAL_TABLET | Freq: Three times a day (TID) | ORAL | 0 refills | Status: DC | PRN

## 2018-04-29 NOTE — Discharge Summary (Signed)
Physician Discharge Summary  Patient ID: Krista [name] MRN: [id number] DOB/AGE: Jan 23, 1986 32 y.o.  Admit date: 04/28/2018 Discharge date: 04/29/2018  Admission Diagnoses:  Discharge Diagnoses:  Active Problems:   Sickle cell anemia (HCC)   Discharged Condition: good  Hospital Course: 32 yo G1P0 admitted @ 38.1 wga with sickle cell pain crisis. She received IVF, IV dilaudid and zofran. She improved significantly and on HD#2 she had not required any further pain medications, anti-emetic, or oxygen. Her d/c hgb was 7 w/out any evidence bleeding/anemia on exam. No OB c/o. SS team was consulted and they agreed with plan and d/c. Her fetal status was reassuring through-out with reactive tracing. Her CXR did note cardiomegaly w/out any pulmonary process. She will plan for repeat CXR pp to ensure improvement.   Consults: sickle cell  Significant Diagnostic Studies: labs:  CBC    Component Value Date/Time   WBC 15.1 (H) 04/29/2018 0539   RBC 1.97 (L) 04/29/2018 0539   HGB 7.0 (L) 04/29/2018 0539   HCT 20.7 (L) 04/29/2018 0539   PLT 499 (H) 04/29/2018 0539   MCV 105.1 (H) 04/29/2018 0539   MCV 99.4 (A) 02/05/2013 0822   MCH 35.5 (H) 04/29/2018 0539   MCHC 33.8 04/29/2018 0539   RDW 22.5 (H) 04/29/2018 0539   LYMPHSABS 5,355 (H) 03/24/2017 1501   MONOABS 1,224 (H) 03/24/2017 1501   EOSABS 153 03/24/2017 1501   BASOSABS 153 03/24/2017 1501     Treatments: IV hydration and analgesia: Dilaudid  Discharge Exam: Blood pressure 117/81, pulse 81, temperature 99.2 F (37.3 C), temperature source Oral, resp. rate 18, height 5\' 7"  (1.702 m), weight 78.9 kg (174 lb), last menstrual period 08/08/2017, SpO2 96 %. General appearance: alert, cooperative and appears stated age Resp: clear to auscultation bilaterally Chest wall: no tenderness Cardio: regular rate and rhythm, S1, S2 normal, no murmur, click, rub or gallop Extremities: extremities normal, atraumatic, no cyanosis or edema Abd  gravid Disposition: Discharge disposition: 01-Home or Self Care       Discharge Instructions    Discharge activity:  No Restrictions   Complete by:  As directed    Discharge diet:  No restrictions   Complete by:  As directed    Fetal Kick Count:  Lie on our left side for one hour after a meal, and count the number of times your baby kicks.  If it is less than 5 times, get up, move around and drink some juice.  Repeat the test 30 minutes later.  If it is still less than 5 kicks in an hour, notify your doctor.   Complete by:  As directed    LABOR:  When conractions begin, you should start to time them from the beginning of one contraction to the beginning  of the next.  When contractions are 5 - 10 minutes apart or less and have been regular for at least an hour, you should call your health care provider.   Complete by:  As directed    No sexual activity restrictions   Complete by:  As directed    Notify physician for a general feeling that "something is not right"   Complete by:  As directed    Notify physician for bleeding from the vagina   Complete by:  As directed    Notify physician for blurring of vision or spots before the eyes   Complete by:  As directed    Notify physician for chills or fever   Complete by:  As  directed    Notify physician for fainting spells, "black outs" or loss of consciousness   Complete by:  As directed    Notify physician for increase in vaginal discharge   Complete by:  As directed    Notify physician for increase or change in vaginal discharge   Complete by:  As directed    Notify physician for intestinal cramps, with or without diarrhea, sometimes described as "gas pain"   Complete by:  As directed    Notify physician for leaking of fluid   Complete by:  As directed    Notify physician for leaking of fluid   Complete by:  As directed    Notify physician for low, dull backache, unrelieved by heat or Tylenol   Complete by:  As directed    Notify  physician for menstrual like cramps   Complete by:  As directed    Notify physician for pain or burning when urinating   Complete by:  As directed    Notify physician for pelvic pressure   Complete by:  As directed    Notify physician for pelvic pressure (sudden increase)   Complete by:  As directed    Notify physician for severe or continued nausea or vomiting   Complete by:  As directed    Notify physician for sudden gushing of fluid from the vagina (with or without continued leaking)   Complete by:  As directed    Notify physician for sudden, constant, or occasional abdominal pain   Complete by:  As directed    Notify physician for uterine contractions.  These may be painless and feel like the uterus is tightening or the baby is  "balling up"   Complete by:  As directed    Notify physician for vaginal bleeding   Complete by:  As directed    Notify physician if baby moving less than usual   Complete by:  As directed    PRETERM LABOR:  Includes any of the follwing symptoms that occur between 20 - [redacted] weeks gestation.  If these symptoms are not stopped, preterm labor can result in preterm delivery, placing your baby at risk   Complete by:  As directed      Allergies as of 04/29/2018      Reactions   Keflex [cephalexin] Swelling      Medication List    STOP taking these medications   mometasone 0.1 % cream Commonly known as:  ELOCON     TAKE these medications   acetaminophen 500 MG tablet Commonly known as:  TYLENOL Take 1 tablet (500 mg total) by mouth every 6 (six) hours as needed.   cetirizine 10 MG tablet Commonly known as:  ZYRTEC TAKE 1 TABLET BY MOUTH ONCE DAILY   cholecalciferol 1000 units tablet Commonly known as:  VITAMIN D Take 1 tablet (1,000 Units total) daily by mouth.   folic acid 1 MG tablet Commonly known as:  FOLVITE Take 1 tablet (1 mg total) by mouth daily.   ondansetron 4 MG tablet Commonly known as:  ZOFRAN Take 1 tablet (4 mg total) by mouth  every 8 (eight) hours as needed for nausea or vomiting.   oxyCODONE-acetaminophen 5-325 MG tablet Commonly known as:  PERCOCET/ROXICET Take 1 tablet by mouth every 6 (six) hours as needed for moderate pain.   prenatal multivitamin Tabs tablet Take 1 tablet by mouth daily at 12 noon.        Signed: Tyson Dense 04/29/2018, 10:06 AM

## 2018-04-29 NOTE — Progress Notes (Signed)
Subjective: At 32 year old female admitted with sickle cell painful crisis. Patient has fatty 8 weeks pregnancy.  She has maintained hemoglobin at around 7.9 yesterday which dropped to about 7.0 today. She is not requiring any pain medication with the last 10 medicine last night.  Patient has no other episode regarding her baby also.  No fever or chills no nausea vomiting or diarrhea  Objective: Vital signs in last 24 hours: Temp:  [98.1 F (36.7 C)-99.2 F (37.3 C)] 99.2 F (37.3 C) (07/13 0758) Pulse Rate:  [79-87] 81 (07/13 0758) Resp:  [16-18] 18 (07/13 0758) BP: (108-117)/(64-81) 117/81 (07/13 0758) SpO2:  [94 %-96 %] 96 % (07/13 0758) Weight change:     Intake/Output from previous day: 07/12 0701 - 07/13 0700 In: 2545.1 [I.V.:1544.5; IV Piggyback:1000.6] Out: -  Intake/Output this shift: No intake/output data recorded.  General appearance: alert, cooperative, appears stated age and pregnant uterus Eyes: conjunctivae/corneas clear. PERRL, EOM's intact. Fundi benign. Resp: clear to auscultation bilaterally Chest wall: no tenderness Cardio: regular rate and rhythm, S1, S2 normal, no murmur, click, rub or gallop GI: soft, non-tender; bowel sounds normal; no masses,  no organomegaly Extremities: extremities normal, atraumatic, no cyanosis or edema Skin: Skin color, texture, turgor normal. No rashes or lesions Neurologic: Grossly normal  Lab Results: Recent Labs    04/28/18 1623 04/29/18 0539  WBC 17.4* 15.1*  HGB 7.9* 7.0*  HCT 23.4* 20.7*  PLT 493* 499*   BMET Recent Labs    04/28/18 1000 04/28/18 1623  NA 135  --   K 4.4  --   CL 108  --   CO2 18*  --   GLUCOSE 78  --   BUN 5*  --   CREATININE 0.48 0.44  CALCIUM 8.8*  --     Studies/Results: Dg Chest 2 View  Result Date: 04/28/2018 CLINICAL DATA:  Sickle cell anemia. Thirty-eight weeks pregnant. Shortness of breath, chest pain and vomiting. EXAM: CHEST - 2 VIEW COMPARISON:  02/05/2013 FINDINGS: Cardiac  enlargement noted. No pleural effusion or edema. No airspace opacities identified. The visualized osseous structures are unremarkable. IMPRESSION: No active cardiopulmonary disease. Cardiac enlargement. Electronically Signed   By: Kerby Moors M.D.   On: 04/28/2018 12:54    Medications: I have reviewed the patient's current medications.  Assessment/Plan: At 32 year old female [redacted] weeks pregnant admitted with sickle cell painful crisis.  #1 sickle cell painful crisis: Appears to have resolved at this point.  Patient can likely be discharged on Percocet 02/18/24.  Continue obstetric care.  #2 anemia of chronic disease: Patient's hemoglobin has dropped to 7.0 probably hemodilution from IV fluids.  She is asymptomatic so I will not rush to transfuse her.  Her baseline is around 7.  #3 leukocytosis: White count has dropped to 15,000.  Chronically elevated from sickle cell disease  #4 3rd trimester pregnancy: Continue according to obstetrics   LOS: 1 day   Andreus Cure,LAWAL 04/29/2018, 5:59 PM

## 2018-05-03 ENCOUNTER — Encounter (HOSPITAL_COMMUNITY): Payer: Self-pay | Admitting: *Deleted

## 2018-05-03 ENCOUNTER — Telehealth (HOSPITAL_COMMUNITY): Payer: Self-pay | Admitting: *Deleted

## 2018-05-03 LAB — OB RESULTS CONSOLE GBS: GBS: POSITIVE

## 2018-05-03 NOTE — Telephone Encounter (Signed)
Preadmission screen  

## 2018-05-04 ENCOUNTER — Encounter (HOSPITAL_COMMUNITY): Payer: Self-pay

## 2018-05-04 ENCOUNTER — Inpatient Hospital Stay (HOSPITAL_COMMUNITY): Payer: BC Managed Care – PPO | Admitting: Anesthesiology

## 2018-05-04 ENCOUNTER — Inpatient Hospital Stay (HOSPITAL_COMMUNITY)
Admission: AD | Admit: 2018-05-04 | Discharge: 2018-05-06 | DRG: 807 | Disposition: A | Discharge status: Home / Self Care | Payer: BC Managed Care – PPO | Attending: Obstetrics & Gynecology | Admitting: Obstetrics & Gynecology

## 2018-05-04 ENCOUNTER — Other Ambulatory Visit: Payer: Self-pay

## 2018-05-04 DIAGNOSIS — Z3490 Encounter for supervision of normal pregnancy, unspecified, unspecified trimester: Secondary | ICD-10-CM

## 2018-05-04 DIAGNOSIS — Z23 Encounter for immunization: Secondary | ICD-10-CM

## 2018-05-04 DIAGNOSIS — Z3A39 39 weeks gestation of pregnancy: Secondary | ICD-10-CM

## 2018-05-04 DIAGNOSIS — O99824 Streptococcus B carrier state complicating childbirth: Secondary | ICD-10-CM | POA: Diagnosis present

## 2018-05-04 DIAGNOSIS — Z3483 Encounter for supervision of other normal pregnancy, third trimester: Secondary | ICD-10-CM | POA: Diagnosis present

## 2018-05-04 DIAGNOSIS — O9902 Anemia complicating childbirth: Secondary | ICD-10-CM | POA: Diagnosis present

## 2018-05-04 DIAGNOSIS — Z87891 Personal history of nicotine dependence: Secondary | ICD-10-CM

## 2018-05-04 DIAGNOSIS — D571 Sickle-cell disease without crisis: Secondary | ICD-10-CM | POA: Diagnosis present

## 2018-05-04 LAB — CBC
HEMATOCRIT: 24.1 % — AB (ref 36.0–46.0)
Hemoglobin: 8.3 g/dL — ABNORMAL LOW (ref 12.0–15.0)
MCH: 35.9 pg — ABNORMAL HIGH (ref 26.0–34.0)
MCHC: 34.4 g/dL (ref 30.0–36.0)
MCV: 104.3 fL — AB (ref 78.0–100.0)
Platelets: 578 10*3/uL — ABNORMAL HIGH (ref 150–400)
RBC: 2.31 MIL/uL — AB (ref 3.87–5.11)
RDW: 22.8 % — AB (ref 11.5–15.5)
WBC: 18.3 10*3/uL — AB (ref 4.0–10.5)

## 2018-05-04 LAB — POCT FERN TEST: POCT FERN TEST: POSITIVE — AB

## 2018-05-04 LAB — TYPE AND SCREEN
ABO/RH(D): B POS
ANTIBODY SCREEN: NEGATIVE

## 2018-05-04 LAB — RPR: RPR: NONREACTIVE

## 2018-05-04 MED ORDER — BENZOCAINE-MENTHOL 20-0.5 % EX AERO
1.0000 "application " | INHALATION_SPRAY | CUTANEOUS | Status: DC | PRN
  Administered 2018-05-04: 1 via TOPICAL
  Filled 2018-05-04: qty 56

## 2018-05-04 MED ORDER — COCONUT OIL OIL: 1.0000 "application " | TOPICAL_OIL | Status: DC | PRN

## 2018-05-04 MED ORDER — ONDANSETRON HCL 4 MG/2ML IJ SOLN: 4.0000 mg | INTRAMUSCULAR | Status: DC | PRN

## 2018-05-04 MED ORDER — FLEET ENEMA 7-19 GM/118ML RE ENEM: 1.0000 | ENEMA | RECTAL | Status: DC | PRN

## 2018-05-04 MED ORDER — OXYTOCIN BOLUS FROM INFUSION
500.0000 mL | Freq: Once | INTRAVENOUS | Status: AC
  Administered 2018-05-04: 500 mL via INTRAVENOUS

## 2018-05-04 MED ORDER — ACETAMINOPHEN 325 MG PO TABS: 650.0000 mg | ORAL_TABLET | ORAL | Status: DC | PRN

## 2018-05-04 MED ORDER — EPHEDRINE 5 MG/ML INJ
10.0000 mg | INTRAVENOUS | Status: DC | PRN
  Filled 2018-05-04: qty 2

## 2018-05-04 MED ORDER — OXYCODONE-ACETAMINOPHEN 5-325 MG PO TABS: 1.0000 | ORAL_TABLET | ORAL | Status: DC | PRN

## 2018-05-04 MED ORDER — LACTATED RINGERS IV SOLN
500.0000 mL | Freq: Once | INTRAVENOUS | Status: AC
  Administered 2018-05-04: 500 mL via INTRAVENOUS

## 2018-05-04 MED ORDER — SENNOSIDES-DOCUSATE SODIUM 8.6-50 MG PO TABS
2.0000 | ORAL_TABLET | ORAL | Status: DC
  Administered 2018-05-04 – 2018-05-06 (×2): 2 via ORAL
  Filled 2018-05-04 (×2): qty 2

## 2018-05-04 MED ORDER — PHENYLEPHRINE 40 MCG/ML (10ML) SYRINGE FOR IV PUSH (FOR BLOOD PRESSURE SUPPORT): 80.0000 ug | PREFILLED_SYRINGE | INTRAVENOUS | Status: DC | PRN

## 2018-05-04 MED ORDER — OXYTOCIN 40 UNITS IN LACTATED RINGERS INFUSION - SIMPLE MED: 2.5000 [IU]/h | INTRAVENOUS | Status: DC

## 2018-05-04 MED ORDER — LIDOCAINE HCL (PF) 1 % IJ SOLN
30.0000 mL | INTRAMUSCULAR | Status: DC | PRN
  Filled 2018-05-04: qty 30

## 2018-05-04 MED ORDER — OXYTOCIN 40 UNITS IN LACTATED RINGERS INFUSION - SIMPLE MED
1.0000 m[IU]/min | INTRAVENOUS | Status: DC
  Administered 2018-05-04: 2 m[IU]/min via INTRAVENOUS
  Filled 2018-05-04: qty 1000

## 2018-05-04 MED ORDER — PNEUMOCOCCAL VAC POLYVALENT 25 MCG/0.5ML IJ INJ
0.5000 mL | INJECTION | INTRAMUSCULAR | Status: AC
Start: 2018-05-05 — End: 2018-05-06
  Administered 2018-05-06: 0.5 mL via INTRAMUSCULAR
  Filled 2018-05-04: qty 0.5

## 2018-05-04 MED ORDER — LACTATED RINGERS IV SOLN: 500.0000 mL | Freq: Once | INTRAVENOUS | Status: DC

## 2018-05-04 MED ORDER — VANCOMYCIN HCL IN DEXTROSE 1-5 GM/200ML-% IV SOLN
1000.0000 mg | Freq: Two times a day (BID) | INTRAVENOUS | Status: DC
  Administered 2018-05-04: 1000 mg via INTRAVENOUS
  Filled 2018-05-04 (×2): qty 200

## 2018-05-04 MED ORDER — SIMETHICONE 80 MG PO CHEW: 80.0000 mg | CHEWABLE_TABLET | ORAL | Status: DC | PRN

## 2018-05-04 MED ORDER — PRENATAL MULTIVITAMIN CH
1.0000 | ORAL_TABLET | Freq: Every day | ORAL | Status: DC
  Administered 2018-05-05: 1 via ORAL
  Filled 2018-05-04 (×2): qty 1

## 2018-05-04 MED ORDER — ACETAMINOPHEN 325 MG PO TABS
650.0000 mg | ORAL_TABLET | ORAL | Status: DC | PRN
  Administered 2018-05-05: 650 mg via ORAL

## 2018-05-04 MED ORDER — OXYCODONE-ACETAMINOPHEN 5-325 MG PO TABS: 2.0000 | ORAL_TABLET | ORAL | Status: DC | PRN

## 2018-05-04 MED ORDER — DIBUCAINE 1 % RE OINT: 1.0000 "application " | TOPICAL_OINTMENT | RECTAL | Status: DC | PRN

## 2018-05-04 MED ORDER — SOD CITRATE-CITRIC ACID 500-334 MG/5ML PO SOLN: 30.0000 mL | ORAL | Status: DC | PRN

## 2018-05-04 MED ORDER — ONDANSETRON HCL 4 MG/2ML IJ SOLN
4.0000 mg | Freq: Four times a day (QID) | INTRAMUSCULAR | Status: DC | PRN
  Administered 2018-05-04: 4 mg via INTRAVENOUS
  Filled 2018-05-04: qty 2

## 2018-05-04 MED ORDER — ZOLPIDEM TARTRATE 5 MG PO TABS: 5.0000 mg | ORAL_TABLET | Freq: Every evening | ORAL | Status: DC | PRN

## 2018-05-04 MED ORDER — LACTATED RINGERS IV SOLN
INTRAVENOUS | Status: DC
  Administered 2018-05-04 (×3): via INTRAVENOUS

## 2018-05-04 MED ORDER — ONDANSETRON HCL 4 MG PO TABS: 4.0000 mg | ORAL_TABLET | ORAL | Status: DC | PRN

## 2018-05-04 MED ORDER — BUPIVACAINE HCL (PF) 0.25 % IJ SOLN
INTRAMUSCULAR | Status: DC | PRN
  Administered 2018-05-04 (×2): 5 mL via EPIDURAL

## 2018-05-04 MED ORDER — PHENYLEPHRINE 40 MCG/ML (10ML) SYRINGE FOR IV PUSH (FOR BLOOD PRESSURE SUPPORT)
80.0000 ug | PREFILLED_SYRINGE | INTRAVENOUS | Status: DC | PRN
  Filled 2018-05-04: qty 10
  Filled 2018-05-04: qty 5

## 2018-05-04 MED ORDER — WITCH HAZEL-GLYCERIN EX PADS: 1.0000 "application " | MEDICATED_PAD | CUTANEOUS | Status: DC | PRN

## 2018-05-04 MED ORDER — TETANUS-DIPHTH-ACELL PERTUSSIS 5-2.5-18.5 LF-MCG/0.5 IM SUSP: 0.5000 mL | Freq: Once | INTRAMUSCULAR | Status: DC

## 2018-05-04 MED ORDER — DIPHENHYDRAMINE HCL 25 MG PO CAPS: 25.0000 mg | ORAL_CAPSULE | Freq: Four times a day (QID) | ORAL | Status: DC | PRN

## 2018-05-04 MED ORDER — DIPHENHYDRAMINE HCL 50 MG/ML IJ SOLN: 12.5000 mg | INTRAMUSCULAR | Status: DC | PRN

## 2018-05-04 MED ORDER — CLINDAMYCIN PHOSPHATE 900 MG/50ML IV SOLN
900.0000 mg | Freq: Three times a day (TID) | INTRAVENOUS | Status: DC
  Filled 2018-05-04 (×2): qty 50

## 2018-05-04 MED ORDER — FENTANYL 2.5 MCG/ML BUPIVACAINE 1/10 % EPIDURAL INFUSION (WH - ANES)
14.0000 mL/h | INTRAMUSCULAR | Status: DC | PRN
  Administered 2018-05-04 (×2): 14 mL/h via EPIDURAL
  Filled 2018-05-04 (×2): qty 100

## 2018-05-04 MED ORDER — TERBUTALINE SULFATE 1 MG/ML IJ SOLN
0.2500 mg | Freq: Once | INTRAMUSCULAR | Status: DC | PRN
  Filled 2018-05-04: qty 1

## 2018-05-04 MED ORDER — PHENYLEPHRINE 40 MCG/ML (10ML) SYRINGE FOR IV PUSH (FOR BLOOD PRESSURE SUPPORT)
80.0000 ug | PREFILLED_SYRINGE | INTRAVENOUS | Status: DC | PRN
  Filled 2018-05-04: qty 5

## 2018-05-04 MED ORDER — BUTORPHANOL TARTRATE 1 MG/ML IJ SOLN
1.0000 mg | Freq: Once | INTRAMUSCULAR | Status: AC
  Administered 2018-05-04: 1 mg via INTRAVENOUS
  Filled 2018-05-04: qty 1

## 2018-05-04 MED ORDER — IBUPROFEN 600 MG PO TABS
600.0000 mg | ORAL_TABLET | Freq: Four times a day (QID) | ORAL | Status: DC
  Administered 2018-05-04 – 2018-05-06 (×8): 600 mg via ORAL
  Filled 2018-05-04 (×8): qty 1

## 2018-05-04 MED ORDER — LACTATED RINGERS IV SOLN: 500.0000 mL | INTRAVENOUS | Status: DC | PRN

## 2018-05-04 MED ORDER — EPHEDRINE 5 MG/ML INJ: 10.0000 mg | INTRAVENOUS | Status: DC | PRN

## 2018-05-04 MED ORDER — LIDOCAINE HCL (PF) 1 % IJ SOLN
INTRAMUSCULAR | Status: DC | PRN
  Administered 2018-05-04: 5 mL
  Administered 2018-05-04: 5 mL via EPIDURAL
  Administered 2018-05-04: 2 mL
  Administered 2018-05-04: 3 mL

## 2018-05-04 NOTE — Anesthesia Postprocedure Evaluation (Signed)
Anesthesia Post Note  Patient: Krista [name]  Procedure(s) Performed: AN AD [place]     Patient location during evaluation: Mother Baby Anesthesia Type: Epidural Level of consciousness: awake, awake and alert and oriented Pain management: pain level controlled Vital Signs Assessment: post-procedure vital signs reviewed and stable Respiratory status: spontaneous breathing, nonlabored ventilation and respiratory function stable Cardiovascular status: stable Postop Assessment: no headache, no backache, able to ambulate, adequate PO intake, no apparent nausea or vomiting and patient able to bend at knees Anesthetic complications: no    Last Vitals:  Vitals:   05/04/18 1608 05/04/18 1715  BP: 118/77 115/77  Pulse: (!) 58 62  Resp: 16 18  Temp: 36.8 C 36.8 C  SpO2: 99%     Last Pain:  Vitals:   05/04/18 1715  TempSrc: Oral  PainSc: 3    Pain Goal: Patients Stated Pain Goal: 3 (05/04/18 1615)               Willam Munford

## 2018-05-04 NOTE — H&P (Signed)
Krista [name] is a 32 y.o. female presenting for SROM, irreg ctx. OB History    Gravida  2   Para      Term      Preterm      AB  1   Living        SAB  1   TAB      Ectopic      Multiple      Live Births             Past Medical History:  Diagnosis Date  . Heart murmur   . History of blood transfusion    x3   . Sickle cell anemia (HCC)    sickle cell crisis 7/12   Past Surgical History:  Procedure Laterality Date  . CHOLECYSTECTOMY     Family History: family history includes Breast cancer in her paternal aunt; Cancer in her maternal grandmother and paternal uncle; Diabetes in her father; Heart disease in her brother and brother; Hypertension in her father. Social History:  reports that she has quit smoking. Her smoking use included cigarettes. She has never used smokeless tobacco. She reports that she drank alcohol. She reports that she does not use drugs.     Maternal Diabetes: No Genetic Screening: Normal Maternal Ultrasounds/Referrals: Normal Fetal Ultrasounds or other Referrals:  None Maternal Substance Abuse:  No Significant Maternal Medications:  None Significant Maternal Lab Results:  None Other Comments:  Mother Hgb SS  ROS History Dilation: 1 Effacement (%): 80 Station: -2 Exam by:: Office manager Blood pressure (!) 142/90, pulse 69, temperature 98 F (36.7 C), resp. rate 20, height 5\' 7"  (1.702 m), weight 176 lb 12 oz (80.2 kg), last menstrual period 08/08/2017. Exam Physical Exam  Constitutional: She is oriented to person, place, and time. She appears well-developed.  HENT:  Head: Normocephalic and atraumatic.  Neck: Normal range of motion. Neck supple.  Cardiovascular: Normal rate and regular rhythm.  Respiratory: Effort normal.  GI:  Term FH, FHR 136  Genitourinary:  Genitourinary Comments: Clear ROM FERN +, cx 1 cm  Musculoskeletal: Normal range of motion.  Neurological: She is alert and oriented to person, place, and time.    Prenatal labs: ABO, Rh: --/--/B POS, B POS Performed at [facility], [address], [place], [place] [address]  [phone] 1623) Antibody: NEG (07/12 1623) Rubella: Immune (12/18 0000) RPR: Nonreactive (12/18 0000)  HBsAg: Negative (12/18 0000)  HIV: Non-reactive (12/18 0000)  GBS: Positive (07/17 0000)   Assessment/Plan: Term IUP, SROM, pt is Hgb SS   [place] 05/04/2018, 1:05 AM

## 2018-05-04 NOTE — Anesthesia Pain Management Evaluation Note (Signed)
  CRNA Pain Management Visit Note  Patient: Krista [name], 32 y.o., female  "Hello I am a member of the anesthesia team at [facility]. We have an anesthesia team available at all times to provide care throughout the hospital, including epidural management and anesthesia for C-section. I don't know your plan for the delivery whether it a natural birth, water birth, IV sedation, nitrous supplementation, doula or epidural, but we want to meet your pain goals."   1.Was your pain managed to your expectations on prior hospitalizations?   No prior hospitalizations  2.What is your expectation for pain management during this hospitalization?     Epidural  3.How can we help you reach that goal? Epidural intact;bolusing to obtain better level (see record)  Record the patient's initial score and the patient's pain goal.   Pain: 8  Pain Goal: 3 [facility] wants you to be able to say your pain was always managed very well.  Everette Rank 05/04/2018

## 2018-05-04 NOTE — Progress Notes (Signed)
Pharmacy Antibiotic Note  Krista [name] is a 32 y.o. female admitted on 05/04/2018 in labor and is GBS positive.  Pharmacy has been consulted for vancomycin dosing for GBS prophylaxis. Pt has swelling reaction to Keflex and does not have GBS sensitivities, therefore vancomycin has been started.   Plan: Vancomycin 1 gram Q12 hours until delivery  Height: 5\' 7"  (170.2 cm) Weight: 176 lb 12 oz (80.2 kg) IBW/kg (Calculated) : 61.6  Temp (24hrs), Avg:97.9 F (36.6 C), Min:97.7 F (36.5 C), Max:98 F (36.7 C)  Recent Labs  Lab 04/28/18 1000 04/28/18 1623 04/29/18 0539 05/04/18 0117  WBC 19.4* 17.4* 15.1* 18.3*  CREATININE 0.48 0.44  --   --     Estimated Creatinine Clearance: 111 mL/min (by C-G formula based on SCr of 0.44 mg/dL).    Allergies  Allergen Reactions  . Keflex [Cephalexin] Swelling    Thank you for allowing pharmacy to be a part of this patient's care.  Nyra Capes 05/04/2018 4:40 AM

## 2018-05-04 NOTE — Anesthesia Procedure Notes (Signed)
Epidural Patient location during procedure: OB Start time: 05/04/2018 5:33 AM End time: 05/04/2018 5:50 AM  Staffing Anesthesiologist: Barnet Glasgow, MD Performed: anesthesiologist   Preanesthetic Checklist Completed: patient identified, site marked, surgical consent, pre-op evaluation, timeout performed, IV checked, risks and benefits discussed and monitors and equipment checked  Epidural Patient position: sitting Prep: site prepped and draped and DuraPrep Patient monitoring: continuous pulse ox and blood pressure Approach: midline Location: L3-L4 Injection technique: LOR air  Needle:  Needle type: Tuohy  Needle gauge: 17 G Needle length: 9 cm and 9 Needle insertion depth: 7 cm Catheter type: closed end flexible Catheter size: 19 Gauge Catheter at skin depth: 12 cm Test dose: negative  Assessment Events: blood not aspirated, injection not painful, no injection resistance, negative IV test and no paresthesia

## 2018-05-04 NOTE — Anesthesia Procedure Notes (Signed)
Epidural Patient location during procedure: OB Start time: 05/04/2018 8:43 AM End time: 05/04/2018 8:48 AM  Staffing Anesthesiologist: Suzette Battiest, MD Performed: anesthesiologist   Preanesthetic Checklist Completed: patient identified, site marked, surgical consent, pre-op evaluation, timeout performed, IV checked, risks and benefits discussed and monitors and equipment checked  Epidural Patient position: sitting Prep: site prepped and draped and DuraPrep Patient monitoring: continuous pulse ox and blood pressure Approach: midline Location: L4-L5 Injection technique: LOR air  Needle:  Needle type: Tuohy  Needle gauge: 17 G Needle length: 9 cm and 9 Needle insertion depth: 7 cm Catheter type: closed end flexible Catheter size: 19 Gauge Catheter at skin depth: 12 cm Test dose: negative  Assessment Events: blood not aspirated, injection not painful, no injection resistance, negative IV test and no paresthesia

## 2018-05-04 NOTE — Anesthesia Preprocedure Evaluation (Addendum)
Anesthesia Evaluation  Patient identified by MRN, date of birth, ID band Patient awake    Reviewed: Allergy & Precautions, NPO status , Patient's Chart, lab work & pertinent test results  Airway Mallampati: II  TM Distance: >3 FB Neck ROM: Full    Dental no notable dental hx. (+) Teeth Intact   Pulmonary neg pulmonary ROS, former smoker,    Pulmonary exam normal breath sounds clear to auscultation       Cardiovascular negative cardio ROS Normal cardiovascular exam Rhythm:Regular Rate:Normal     Neuro/Psych negative neurological ROS  negative psych ROS   GI/Hepatic GERD  ,  Endo/Other    Renal/GU      Musculoskeletal   Abdominal   Peds  Hematology  (+) Sickle cell anemia and anemia ,   Anesthesia Other Findings   Reproductive/Obstetrics (+) Pregnancy                            Lab Results  Component Value Date   WBC 18.3 (H) 05/04/2018   HGB 8.3 (L) 05/04/2018   HCT 24.1 (L) 05/04/2018   MCV 104.3 (H) 05/04/2018   PLT 578 (H) 05/04/2018    Anesthesia Physical Anesthesia Plan  ASA: II  Anesthesia Plan: Epidural   Post-op Pain Management:    Induction:   PONV Risk Score and Plan:   Airway Management Planned:   Additional Equipment:   Intra-op Plan:   Post-operative Plan:   Informed Consent: I have reviewed the patients History and Physical, chart, labs and discussed the procedure including the risks, benefits and alternatives for the proposed anesthesia with the patient or authorized representative who has indicated his/her understanding and acceptance.     Plan Discussed with:   Anesthesia Plan Comments:         Anesthesia Quick Evaluation

## 2018-05-04 NOTE — Progress Notes (Signed)
Krista [name] is a 32 y.o. G2P0010 at [redacted]w[redacted]d by ultrasound admitted for rupture of membranes  Subjective: Patient just received CLEA redose as poor pain control.  Objective: BP 123/67   Pulse 70   Temp (!) 97.4 F (36.3 C) (Oral)   Resp 18   Ht 5\' 7"  (1.702 m)   Wt 176 lb 12 oz (80.2 kg)   LMP 08/08/2017   BMI 27.68 kg/m  I/O last 3 completed shifts: In: 640.6 [I.V.:440.6; IV Piggyback:200] Out: -  No intake/output data recorded.  FHT:  FHR: 120 bpm, variability: moderate,  accelerations:  Present,  decelerations:  Absent UC:   regular, every 3 minutes SVE:   Dilation: 3.5 Effacement (%): 90 Station: -1 Exam by:: Dyke Brackett, RN   Labs: Lab Results  Component Value Date   WBC 18.3 (H) 05/04/2018   HGB 8.3 (L) 05/04/2018   HCT 24.1 (L) 05/04/2018   MCV 104.3 (H) 05/04/2018   PLT 578 (H) 05/04/2018    Assessment / Plan: Augmentation of labor, progressing well  Labor: Progressing normally Preeclampsia:  n/a Fetal Wellbeing:  Category I Pain Control:  Epidural I/D:  n/a Anticipated MOD:  NSVD  Krista [name] 05/04/2018, 8:27 AM

## 2018-05-05 LAB — CBC
HCT: 21.4 % — ABNORMAL LOW (ref 36.0–46.0)
Hemoglobin: 7.4 g/dL — ABNORMAL LOW (ref 12.0–15.0)
MCH: 35.9 pg — ABNORMAL HIGH (ref 26.0–34.0)
MCHC: 34.6 g/dL (ref 30.0–36.0)
MCV: 103.9 fL — ABNORMAL HIGH (ref 78.0–100.0)
PLATELETS: 498 10*3/uL — AB (ref 150–400)
RBC: 2.06 MIL/uL — AB (ref 3.87–5.11)
RDW: 22.4 % — ABNORMAL HIGH (ref 11.5–15.5)
WBC: 25.9 10*3/uL — ABNORMAL HIGH (ref 4.0–10.5)

## 2018-05-05 NOTE — Progress Notes (Signed)
Patient doing well  BP 112/72 (BP Location: Right Arm)   Pulse 64   Temp 98.9 F (37.2 C) (Oral)   Resp 18   Ht 5\' 7"  (1.702 m)   Wt 80.2 kg (176 lb 12 oz)   LMP 08/08/2017   SpO2 99%   Breastfeeding? Unknown   BMI 27.68 kg/m  Results for orders placed or performed during the hospital encounter of 05/04/18 (from the past 24 hour(s))  CBC     Status: Abnormal   Collection Time: 05/05/18  5:27 AM  Result Value Ref Range   WBC 25.9 (H) 4.0 - 10.5 K/uL   RBC 2.06 (L) 3.87 - 5.11 MIL/uL   Hemoglobin 7.4 (L) 12.0 - 15.0 g/dL   HCT 21.4 (L) 36.0 - 46.0 %   MCV 103.9 (H) 78.0 - 100.0 fL   MCH 35.9 (H) 26.0 - 34.0 pg   MCHC 34.6 30.0 - 36.0 g/dL   RDW 22.4 (H) 11.5 - 15.5 %   Platelets 498 (H) 150 - 400 K/uL   Abdomen soft and non tender Normal amount of lochia  PPD # 1  Doing well Routine care circ today

## 2018-05-05 NOTE — Lactation Note (Signed)
This note was copied from a baby's chart. Lactation Consultation Note  Patient Name: Krista [name] [id number] Date: 05/05/2018 Reason for consult: Initial assessment;Primapara;Term  P1 mother whose infant is now 86 hours old.  Baby has been sleepy and has not fed since 2230.  Offered to assist mother with latching and she accepted.  Mother's breasts are soft and non tender with short shafted nipples.  Assisted baby to latch in the football hold on the right breast without difficulty.  Good rhythmic sucking noted and intermittent swallows heard.  Mother felt no pain with latch.  Breast compressions taught and mother did a return demonstration.  She is aware of feeding cues and how to awaken a sleepy baby.  Baby became restless at breast after about 10 minutes and I suggested burping.  Mother burped him and latched in the football hold on the left breast easily.  Again, good rhythmic sucking noted for another 5 minutes before he became sleepy.    Encouraged feeding 8-12 times/24 hours or more often if baby shows feeding cues.  Continue STS, breast massage and hand expression after feedings.  Mom made aware of O/P services, breastfeeding support groups, community resources, and our phone # for post-discharge questions. Mother will call for assistance as needed.  Grandmother present and supportive.   Maternal Data Formula Feeding for Exclusion: No Has patient been taught Hand Expression?: Yes Does the patient have breastfeeding experience prior to this delivery?: No  Feeding Feeding Type: Breast Fed Length of feed: 15 min  LATCH Score Latch: Grasps breast easily, tongue down, lips flanged, rhythmical sucking.  Audible Swallowing: A few with stimulation  Type of Nipple: Everted at rest and after stimulation(short shafted bilaterally but baby able to latch)  Comfort (Breast/Nipple): Soft / non-tender  Hold (Positioning): Assistance needed to correctly position infant at breast and  maintain latch.  LATCH Score: 8  Interventions Interventions: Breast feeding basics reviewed;Assisted with latch;Skin to skin;Breast massage;Hand express;Position options;Support pillows;Adjust position;Breast compression  Lactation Tools Discussed/Used WIC Program: No   Consult Status Consult Status: Follow-up Date: 05/06/18 Follow-up type: In-patient    Vidhi Delellis R Mali Eppard 05/05/2018, 6:16 AM

## 2018-05-06 NOTE — Lactation Note (Signed)
This note was copied from a baby's chart. Lactation Consultation Note  Patient Name: Krista [name] GOVPC'H Date: 05/06/2018 Reason for consult: Follow-up assessment;Infant weight loss;Term;1st time breastfeeding;Primapara  Baby is 43 hours  LC reviewed and updated doc flow sheets per mom  Baby is post circ and sound asleep resting moms chest.  LC recommended since the baby last fed at 6:05 am for 40 mins, to place baby  STS. Baby not showing feeding cues. LC gave mom heads up the baby is sleepy  From the circ and Tylenol and he probably will cluster feed when he wakes up.  Per mom nipples are tender. LC offered to assess and mom receptive.  No breakdown noted, just warm breast and semi compressible areolas.  LC mentioned to mom  Her breast are filling, and the warm is a good sign,.  LC reviewed sore nipple and engorgement prevention and tx .  LC instructed mom on the use shells ( between feedings except when sleeping )   hand pump, and the #24 F is a good fit  For today, but has breast are fuller will need to increase to #75F ( per mom has a set at home )  Also a DEBP Medela.  Mother informed of post-discharge support and given phone number to the lactation department, including services for phone call assistance; out-patient appointments; and breastfeeding support group. List of other breastfeeding resources in the community given in the handout. Encouraged mother to call for problems or concerns related to breastfeeding.    Maternal Data Has patient been taught Hand Expression?: Yes  Feeding Feeding Type: Breast Fed Length of feed: 40 min(mom reports swallows )  LATCH Score                   Interventions Interventions: Breast feeding basics reviewed  Lactation Tools Discussed/Used Pump Review: Milk Storage;Setup, frequency, and cleaning Initiated by:: mai  Date initiated:: 05/06/18   Consult Status Consult Status: Complete Date: 05/06/18    Myer Haff 05/06/2018, 9:56 AM

## 2018-05-06 NOTE — Discharge Summary (Signed)
Obstetric Discharge Summary Reason for Admission: rupture of membranes Prenatal Procedures: none Intrapartum Procedures: spontaneous vaginal delivery Postpartum Procedures: none Complications-Operative and Postpartum: 2nd degree perineal laceration Hemoglobin  Date Value Ref Range Status  05/05/2018 7.4 (L) 12.0 - 15.0 g/dL Final   HCT  Date Value Ref Range Status  05/05/2018 21.4 (L) 36.0 - 46.0 % Final    Physical Exam:  General: alert, cooperative and appears stated age 32: appropriate Uterine Fundus: firm Incision: healing well, no significant drainage, no dehiscence DVT Evaluation: No evidence of DVT seen on physical exam.  Discharge Diagnoses: Term Pregnancy-delivered  Discharge Information: Date: 05/06/2018 Activity: pelvic rest Diet: routine Medications: None Condition: improved Instructions: refer to practice specific booklet Discharge to: home   Newborn Data: Live born female  Birth Weight: 6 lb 9.1 oz (2980 g) APGAR: 8, 9  Newborn Delivery   Birth date/time:  05/04/2018 14:11:00 Delivery type:  Vaginal, Spontaneous     Home with mother.  Tatem Fesler L 05/06/2018, 8:04 AM

## 2018-05-17 ENCOUNTER — Inpatient Hospital Stay (HOSPITAL_COMMUNITY): Admission: RE | Admit: 2018-05-17 | Payer: BC Managed Care – PPO | Source: Ambulatory Visit

## 2018-06-12 ENCOUNTER — Encounter: Payer: Self-pay | Admitting: Family Medicine

## 2018-06-12 ENCOUNTER — Ambulatory Visit (INDEPENDENT_AMBULATORY_CARE_PROVIDER_SITE_OTHER): Payer: BC Managed Care – PPO | Admitting: Family Medicine

## 2018-06-12 VITALS — BP 129/69 | HR 108 | Temp 98.7°F | Resp 16 | Ht 67.0 in | Wt 158.0 lb

## 2018-06-12 DIAGNOSIS — D571 Sickle-cell disease without crisis: Secondary | ICD-10-CM | POA: Diagnosis not present

## 2018-06-12 NOTE — Progress Notes (Signed)
[facility] INTERNAL MEDICINE AND SICKLE CELL CARE  SICKLE CELL ANEMIA FOLLOW UP VISIT PROVIDER: Lanae Boast, FNP    Subjective:   Krista [name]  is a 32 y.o.  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 presents for a follow up for Sickle Cell Anemia. her last hospitalization was 04/28/2018. she has had less than 6  hospitalizations in the past 12 months.  Pain regimen includes: Ibuprofen and Acetaminophen.  Hydrea Therapy: No Medication compliance: Yes  Pain today is 0/10 Patient states that she stays hydrated.  Patient with recent delivery of a baby boy, Krista [name] on 05/04/2018. Doing well today and without complaints.   Review of Systems  Constitutional: Negative.   HENT: Negative.   Eyes: Negative.   Respiratory: Negative.   Cardiovascular: Negative.   Gastrointestinal: Negative.   Genitourinary: Negative.   Musculoskeletal: Negative.   Skin: Negative.   Neurological: Negative.   Psychiatric/Behavioral: Negative.     Objective:   Objective  BP 129/69 (BP Location: Right Arm, Patient Position: Sitting, Cuff Size: Normal)   Pulse (!) 108   Temp 98.7 F (37.1 C) (Oral)   Resp 16   Ht 5\' 7"  (1.702 m)   Wt 158 lb (71.7 kg)   LMP 08/08/2017   SpO2 96%   BMI 24.75 kg/m    Physical Exam  Constitutional: She is oriented to person, place, and time. She appears well-developed and well-nourished. No distress.  HENT:  Head: Normocephalic and atraumatic.  Eyes: Pupils are equal, round, and reactive to light. Conjunctivae and EOM are normal.  Neck: Normal range of motion.  Cardiovascular: Normal rate, regular rhythm, normal heart sounds and intact distal pulses.  Pulmonary/Chest: Effort normal and breath sounds normal. No respiratory distress.  Abdominal: Soft. Bowel sounds are normal. She exhibits no distension.  Musculoskeletal: Normal range of motion.  Neurological: She is alert and  oriented to person, place, and time.  Skin: Skin is warm and dry.  Psychiatric: She has a normal mood and affect. Her behavior is normal. Thought content normal.  Nursing note and vitals reviewed.    Assessment/Plan:   Encounter Diagnosis  Name Primary?  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 Hb-SS disease without crisis ([place]) Continue with current treatment regimen.  - CBC with Differential - Comprehensive metabolic panel - Vitamin D, 25-hydroxy   Return to care as scheduled and prn. Patient verbalized understanding and agreed with plan of care.   1. Sickle cell disease -We discussed the need for good hydration, monitoring of hydration status, avoidance of heat, cold, stress, and infection triggers. We discussed the risks and benefits of Hydrea, including bone marrow suppression, the possibility of GI upset, skin ulcers, hair thinning, and teratogenicity. The patient was reminded of the need to seek medical attention of any symptoms of bleeding, anemia, or infection. Continue folic acid 1 mg daily to prevent aplastic bone marrow crises.   2. Pulmonary evaluation - Patient denies severe recurrent wheezes, shortness of breath with exercise, or persistent cough. If these symptoms develop, pulmonary function tests with spirometry will be ordered, and if abnormal, plan on referral to Pulmonology for further evaluation.  3. Cardiac - Routine screening for pulmonary hypertension is not recommended.  4. Eye - High risk of proliferative retinopathy. Annual eye exam with retinal exam recommended to patient.  5. Immunization status -  Yearly influenza vaccination is recommended, as  well as being up to date with Meningococcal and Pneumococcal vaccines.   6. Acute and chronic painful episodes -Patient is not currently on opioid therapy. Will continue with ibuprofen and tylenol prn  7. Iron overload from chronic transfusion.  Not applicable at this time.  If this occurs will use  Exjade for management.   8. Vitamin D deficiency - Drisdol 50,000 units weekly. Patient encouraged to take as prescribed.   The above recommendations are taken from the NIH Evidence-Based Management of Sickle Cell Disease: Expert Panel Report, 20149.   [name], FNP-BC Patient [facility] [address] [place], [place] [address] [phone]  This note has been created with Dragon speech recognition software and smart phrase technology. Any transcriptional errors are unintentional.

## 2018-06-12 NOTE — Patient Instructions (Signed)
Sickle Cell Anemia, Adult °Sickle cell anemia is a condition where your red blood cells are shaped like sickles. Red blood cells carry oxygen through the body. Sickle-shaped red blood cells do not live as long as normal red blood cells. They also clump together and block blood from flowing through the blood vessels. These things prevent the body from getting enough oxygen. Sickle cell anemia causes organ damage and pain. It also increases the risk of infection. °Follow these instructions at home: °· Drink enough fluid to keep your pee (urine) clear or pale yellow. Drink more in hot weather and during exercise. °· Do not smoke. Smoking lowers oxygen levels in the blood. °· Only take over-the-counter or prescription medicines as told by your doctor. °· Take antibiotic medicines as told by your doctor. Make sure you finish them even if you start to feel better. °· Take supplements as told by your doctor. °· Consider wearing a medical alert bracelet. This tells anyone caring for you in an emergency of your condition. °· When traveling, keep your medical information, doctors' names, and the medicines you take with you at all times. °· If you have a fever, do not take fever medicines right away. This could cover up a problem. Tell your doctor. °· Keep all follow-up visits with your doctor. Sickle cell anemia requires regular medical care. °Contact a doctor if: °You have a fever. °Get help right away if: °· You feel dizzy or faint. °· You have new belly (abdominal) pain, especially on the left side near the stomach area. °· You have a lasting, often uncomfortable and painful erection of the penis (priapism). If it is not treated right away, you will become unable to have sex (impotence). °· You have numbness in your arms or legs or you have a hard time moving them. °· You have a hard time talking. °· You have a fever or lasting symptoms for more than 2-3 days. °· You have a fever and your symptoms suddenly get  worse. °· You have signs or symptoms of infection. These include: °? Chills. °? Being more tired than normal (lethargy). °? Irritability. °? Poor eating. °? Throwing up (vomiting). °· You have pain that is not helped with medicine. °· You have shortness of breath. °· You have pain in your chest. °· You are coughing up pus-like or bloody mucus. °· You have a stiff neck. °· Your feet or hands swell or have pain. °· Your belly looks bloated. °· Your joints hurt. °This information is not intended to replace advice given to you by your health care provider. Make sure you discuss any questions you have with your health care provider. °Document Released: 07/25/2013 Document Revised: 03/11/2016 Document Reviewed: 05/16/2013 °Elsevier Interactive Patient Education © 2017 Elsevier Inc. ° °

## 2018-06-13 LAB — CBC WITH DIFFERENTIAL/PLATELET
Basophils Absolute: 0.1 10*3/uL (ref 0.0–0.2)
Basos: 1 %
EOS (ABSOLUTE): 0.1 10*3/uL (ref 0.0–0.4)
Eos: 1 %
Hematocrit: 22.8 % — ABNORMAL LOW (ref 34.0–46.6)
Hemoglobin: 7.7 g/dL — ABNORMAL LOW (ref 11.1–15.9)
Immature Grans (Abs): 0.1 10*3/uL (ref 0.0–0.1)
Immature Granulocytes: 1 %
Lymphocytes Absolute: 3.9 10*3/uL — ABNORMAL HIGH (ref 0.7–3.1)
Lymphs: 30 %
MCH: 30.3 pg (ref 26.6–33.0)
MCHC: 33.8 g/dL (ref 31.5–35.7)
MCV: 90 fL (ref 79–97)
Monocytes Absolute: 0.9 10*3/uL (ref 0.1–0.9)
Monocytes: 7 %
NRBC: 4 % — ABNORMAL HIGH (ref 0–0)
Neutrophils Absolute: 7.6 10*3/uL — ABNORMAL HIGH (ref 1.4–7.0)
Neutrophils: 60 %
Platelets: 731 10*3/uL — ABNORMAL HIGH (ref 150–450)
RBC: 2.54 x10E6/uL — CL (ref 3.77–5.28)
RDW: 17.6 % — ABNORMAL HIGH (ref 12.3–15.4)
WBC: 12.7 10*3/uL — ABNORMAL HIGH (ref 3.4–10.8)

## 2018-06-13 LAB — COMPREHENSIVE METABOLIC PANEL
ALT: 16 IU/L (ref 0–32)
AST: 24 IU/L (ref 0–40)
Albumin/Globulin Ratio: 1.6 (ref 1.2–2.2)
Albumin: 4.6 g/dL (ref 3.5–5.5)
Alkaline Phosphatase: 107 IU/L (ref 39–117)
BUN/Creatinine Ratio: 8 — ABNORMAL LOW (ref 9–23)
BUN: 5 mg/dL — ABNORMAL LOW (ref 6–20)
Bilirubin Total: 2.6 mg/dL — ABNORMAL HIGH (ref 0.0–1.2)
CO2: 23 mmol/L (ref 20–29)
Calcium: 9.5 mg/dL (ref 8.7–10.2)
Chloride: 102 mmol/L (ref 96–106)
Creatinine, Ser: 0.63 mg/dL (ref 0.57–1.00)
GFR calc Af Amer: 138 mL/min/{1.73_m2} (ref >59)
GFR calc non Af Amer: 120 mL/min/{1.73_m2} (ref >59)
Globulin, Total: 2.8 g/dL (ref 1.5–4.5)
Glucose: 108 mg/dL — ABNORMAL HIGH (ref 65–99)
Potassium: 4.2 mmol/L (ref 3.5–5.2)
Sodium: 140 mmol/L (ref 134–144)
Total Protein: 7.4 g/dL (ref 6.0–8.5)

## 2018-06-13 LAB — VITAMIN D 25 HYDROXY (VIT D DEFICIENCY, FRACTURES): Vit D, 25-Hydroxy: 31 ng/mL (ref 30.0–100.0)

## 2018-09-11 ENCOUNTER — Other Ambulatory Visit: Payer: Self-pay | Admitting: Family Medicine

## 2018-09-11 DIAGNOSIS — E559 Vitamin D deficiency, unspecified: Secondary | ICD-10-CM

## 2018-10-16 ENCOUNTER — Telehealth: Payer: Self-pay

## 2018-10-16 NOTE — Telephone Encounter (Signed)
Patient son was diagnosed for Flu A at appointment this afternoon and she states that she is having a fever and flu symptoms and wants to know if Tamiflu can be sent in for her.

## 2018-10-17 ENCOUNTER — Other Ambulatory Visit: Payer: Self-pay | Admitting: Family Medicine

## 2018-10-17 DIAGNOSIS — R6889 Other general symptoms and signs: Secondary | ICD-10-CM

## 2018-10-17 MED ORDER — OSELTAMIVIR PHOSPHATE 45 MG PO CAPS: 45.0000 mg | ORAL_CAPSULE | Freq: Two times a day (BID) | ORAL | 0 refills | Status: DC

## 2018-10-17 NOTE — Telephone Encounter (Signed)
Called and spoke with patient, advised that tamiflu has been sent into pharmacy. Thanks!

## 2018-10-17 NOTE — Progress Notes (Signed)
tam

## 2018-11-25 ENCOUNTER — Other Ambulatory Visit: Payer: Self-pay

## 2018-11-25 ENCOUNTER — Emergency Department (HOSPITAL_COMMUNITY)
Admission: EM | Admit: 2018-11-25 | Discharge: 2018-11-25 | Disposition: A | Discharge status: Home / Self Care | Payer: BC Managed Care – PPO | Attending: Emergency Medicine | Admitting: Emergency Medicine

## 2018-11-25 DIAGNOSIS — R1013 Epigastric pain: Secondary | ICD-10-CM | POA: Diagnosis present

## 2018-11-25 DIAGNOSIS — D5700 Hb-SS disease with crisis, unspecified: Secondary | ICD-10-CM

## 2018-11-25 DIAGNOSIS — Z79899 Other long term (current) drug therapy: Secondary | ICD-10-CM | POA: Diagnosis not present

## 2018-11-25 DIAGNOSIS — Z87891 Personal history of nicotine dependence: Secondary | ICD-10-CM | POA: Diagnosis not present

## 2018-11-25 LAB — URINALYSIS, ROUTINE W REFLEX MICROSCOPIC
Bacteria, UA: NONE SEEN
Bilirubin Urine: NEGATIVE
Glucose, UA: NEGATIVE mg/dL
Hgb urine dipstick: NEGATIVE
Ketones, ur: NEGATIVE mg/dL
LEUKOCYTES UA: NEGATIVE
Nitrite: NEGATIVE
PH: 5 (ref 5.0–8.0)
Protein, ur: 100 mg/dL — AB
Specific Gravity, Urine: 1.013 (ref 1.005–1.030)

## 2018-11-25 LAB — LACTATE DEHYDROGENASE: LDH: 198 U/L — ABNORMAL HIGH (ref 98–192)

## 2018-11-25 LAB — COMPREHENSIVE METABOLIC PANEL
ALT: 17 U/L (ref 0–44)
ANION GAP: 15 (ref 5–15)
AST: 22 U/L (ref 15–41)
Albumin: 4.5 g/dL (ref 3.5–5.0)
Alkaline Phosphatase: 73 U/L (ref 38–126)
BUN: 7 mg/dL (ref 6–20)
CO2: 20 mmol/L — ABNORMAL LOW (ref 22–32)
Calcium: 9.1 mg/dL (ref 8.9–10.3)
Chloride: 111 mmol/L (ref 98–111)
Creatinine, Ser: 0.6 mg/dL (ref 0.44–1.00)
GFR calc Af Amer: >60 mL/min (ref >60)
GFR calc non Af Amer: >60 mL/min (ref >60)
Glucose, Bld: 136 mg/dL — ABNORMAL HIGH (ref 70–99)
POTASSIUM: 3.7 mmol/L (ref 3.5–5.1)
Sodium: 146 mmol/L — ABNORMAL HIGH (ref 135–145)
Total Bilirubin: 4.6 mg/dL — ABNORMAL HIGH (ref 0.3–1.2)
Total Protein: 7.8 g/dL (ref 6.5–8.1)

## 2018-11-25 LAB — RETICULOCYTES
Immature Retic Fract: 29.1 % — ABNORMAL HIGH (ref 2.3–15.9)
RBC.: 2.89 MIL/uL — AB (ref 3.87–5.11)
Retic Count, Absolute: 359.6 10*3/uL — ABNORMAL HIGH (ref 19.0–186.0)
Retic Ct Pct: 13 % — ABNORMAL HIGH (ref 0.4–3.1)

## 2018-11-25 LAB — CBC WITH DIFFERENTIAL/PLATELET
Abs Immature Granulocytes: 0.07 10*3/uL (ref 0.00–0.07)
Basophils Absolute: 0 10*3/uL (ref 0.0–0.1)
Basophils Relative: 0 %
EOS ABS: 0.1 10*3/uL (ref 0.0–0.5)
Eosinophils Relative: 0 %
HCT: 26.8 % — ABNORMAL LOW (ref 36.0–46.0)
Hemoglobin: 8.7 g/dL — ABNORMAL LOW (ref 12.0–15.0)
Immature Granulocytes: 1 %
Lymphocytes Relative: 3 %
Lymphs Abs: 0.4 10*3/uL — ABNORMAL LOW (ref 0.7–4.0)
MCH: 30.1 pg (ref 26.0–34.0)
MCHC: 32.5 g/dL (ref 30.0–36.0)
MCV: 92.7 fL (ref 80.0–100.0)
MONO ABS: 0.6 10*3/uL (ref 0.1–1.0)
Monocytes Relative: 4 %
Neutro Abs: 13.2 10*3/uL — ABNORMAL HIGH (ref 1.7–7.7)
Neutrophils Relative %: 92 %
Platelets: 756 10*3/uL — ABNORMAL HIGH (ref 150–400)
RBC: 2.89 MIL/uL — ABNORMAL LOW (ref 3.87–5.11)
RDW: 17.3 % — ABNORMAL HIGH (ref 11.5–15.5)
WBC: 14.3 10*3/uL — ABNORMAL HIGH (ref 4.0–10.5)
nRBC: 1.5 % — ABNORMAL HIGH (ref 0.0–0.2)

## 2018-11-25 LAB — LIPASE, BLOOD: Lipase: 22 U/L (ref 11–51)

## 2018-11-25 LAB — I-STAT BETA HCG BLOOD, ED (MC, WL, AP ONLY): I-stat hCG, quantitative: <5 m[IU]/mL (ref <5)

## 2018-11-25 MED ORDER — HYDROMORPHONE HCL 1 MG/ML IJ SOLN
1.0000 mg | INTRAMUSCULAR | Status: AC
  Administered 2018-11-25: 1 mg via INTRAVENOUS
  Filled 2018-11-25: qty 1

## 2018-11-25 MED ORDER — KETOROLAC TROMETHAMINE 30 MG/ML IJ SOLN
30.0000 mg | INTRAMUSCULAR | Status: AC
  Administered 2018-11-25: 30 mg via INTRAVENOUS
  Filled 2018-11-25: qty 1

## 2018-11-25 MED ORDER — OXYCODONE HCL 5 MG PO TABA: 5.0000 mg | ORAL_TABLET | ORAL | 0 refills | Status: DC

## 2018-11-25 MED ORDER — ONDANSETRON HCL 4 MG/2ML IJ SOLN
4.0000 mg | Freq: Once | INTRAMUSCULAR | Status: AC
  Administered 2018-11-25: 4 mg via INTRAVENOUS
  Filled 2018-11-25: qty 2

## 2018-11-25 MED ORDER — HYDROMORPHONE HCL 1 MG/ML IJ SOLN: 1.0000 mg | INTRAMUSCULAR | Status: DC

## 2018-11-25 MED ORDER — HYDROMORPHONE HCL 1 MG/ML IJ SOLN: 1.0000 mg | INTRAMUSCULAR | Status: AC

## 2018-11-25 MED ORDER — ONDANSETRON 4 MG PO TBDP: 4.0000 mg | ORAL_TABLET | Freq: Three times a day (TID) | ORAL | 0 refills | Status: DC | PRN

## 2018-11-25 MED ORDER — OXYCODONE HCL 5 MG PO TABS: 5.0000 mg | ORAL_TABLET | ORAL | 0 refills | Status: AC | PRN

## 2018-11-25 MED ORDER — DEXTROSE 5 % AND 0.45 % NACL IV BOLUS
1000.0000 mL | Freq: Once | INTRAVENOUS | Status: AC
  Administered 2018-11-25: 1000 mL via INTRAVENOUS

## 2018-11-25 MED ORDER — SODIUM CHLORIDE 0.9 % IV BOLUS: 1000.0000 mL | Freq: Once | INTRAVENOUS | Status: DC

## 2018-11-25 MED ORDER — MORPHINE SULFATE (PF) 4 MG/ML IV SOLN
4.0000 mg | Freq: Once | INTRAVENOUS | Status: AC
  Administered 2018-11-25: 4 mg via INTRAVENOUS
  Filled 2018-11-25: qty 1

## 2018-11-25 NOTE — Discharge Instructions (Addendum)
You were seen in the ER for epigastric abdominal pain. This is likely from a virus that led to dehydration and sickle cell crisis.    Zofran for nausea. Stay well hydrated.  Take acetaminophen [phone] mg for pain every 6-8 hours.  Use ibuprofen sparingly as it can cause stomach irritation.  Oxycodone for break through severe sickle cell type pain.  Return for fever greater than 100.31F, chest pain, shortness of breath, cough, diarrhea, changes in stool, urinary symptoms, focal pain to left upper, right upper or right lower abdomen.

## 2018-11-25 NOTE — ED Triage Notes (Signed)
Pt here for evaluation of central upper abdominal pain and vomiting since 8 pm yesterday. No diarrhea.

## 2018-11-25 NOTE — ED Notes (Signed)
Pt tolerating PO intake without complaint or nausea

## 2018-11-25 NOTE — ED Provider Notes (Addendum)
[facility] EMERGENCY DEPARTMENT Provider Note   CSN: [id number] Arrival date & time: 11/25/18  9379     History   Chief Complaint Chief Complaint  Patient presents with  . Abdominal Pain  . Emesis    HPI Krista [name] is a 33 y.o. female with history of sickle cell anemia compliant with folic acid, vitamin D deficiency is here for evaluation of epigastric abdominal pain.  Sudden onset around 8 PM last night.  It is constant, moderate, nonradiating.  It is worse with trying to put food or fluids in her stomach.  Associated with nausea and nonbloody nonbilious emesis up to 10 times since onset.  Last time she vomited was 30 minutes ago.  No interventions for this.  Cannot keep fluids down due to nausea and vomiting.  No sick contacts.  No exposure to suspicious foods or recent travel.  No history of EtOH use.  No history of ulcers or GI bleed.  Status post cholecystectomy.  No other abdominal surgeries.  Denies associated fevers, chills, headaches, cough, sore throat, congestion, shortness of breath, changes to bowel movements, dysuria, hematuria, urinary frequency.  Reports chest wall pain during forceful vomiting but nonexertional, nonpleuritic.  Reports infrequent sickle cell pain crises.  The last time she needed admission was about 6 months ago when she had a crisis during pregnancy.  Prior to that she had not had a sickle cell pain crisis for several years.  She uses ibuprofen or acetaminophen as needed for pain but none in the last several weeks.  She is very thirsty and has noticed no urination since onset of n/v last night.  HPI  Past Medical History:  Diagnosis Date  . Heart murmur   . History of blood transfusion    x3   . Sickle cell anemia ([facility])    sickle cell crisis 7/12    Patient Active Problem List   Diagnosis Date Noted  . Pregnancy 05/04/2018  . Amenorrhea 06/11/2017  . Cellulitis of left upper arm 01/11/2017  . Hb-SS disease without crisis ([place])  08/12/2015  . Thrombocytosis ([place]) 08/12/2015  . Gastroesophageal reflux disease without esophagitis 08/12/2015  . Sickle cell anemia ([place]) 02/03/2013    Past Surgical History:  Procedure Laterality Date  . CHOLECYSTECTOMY       OB History    Gravida  2   Para  1   Term  1   Preterm      AB  1   Living  1     SAB  1   TAB      Ectopic      Multiple  0   Live Births  1            Home Medications    Prior to Admission medications   Medication Sig Start Date End Date Taking? Authorizing Provider  cetirizine (ZYRTEC) 10 MG tablet Take 10 mg by mouth daily.    [provider]  cholecalciferol (VITAMIN D) 1000 units tablet Take 1,000 Units by mouth daily.    [provider]  Cholecalciferol (VITAMIN D3) 25 MCG (1000 UT) CAPS TAKE 1 CAPSULE BY MOUTH ONCE DAILY 09/12/18   Lanae Boast, FNP  folic acid (FOLVITE) 1 MG tablet Take 1 mg by mouth daily.    [provider]  ondansetron (ZOFRAN ODT) 4 MG disintegrating tablet Take 1 tablet (4 mg total) by mouth every 8 (eight) hours as needed for nausea or vomiting. 11/25/18   Carmon Sails  J, PA-C  oseltamivir (TAMIFLU) 45 MG capsule Take 1 capsule (45 mg total) by mouth 2 (two) times daily. 10/17/18   Azzie Glatter, FNP  oxyCODONE (OXY IR/ROXICODONE) 5 MG immediate release tablet Take 1 tablet (5 mg total) by mouth every 4 (four) hours as needed for up to 3 days for severe pain. 11/25/18 11/28/18  Kinnie Feil, PA-C  Prenatal Vit-Fe Fumarate-FA (PRENATAL MULTIVITAMIN) TABS tablet Take 1 tablet by mouth daily at 12 noon.    [provider]    Family History Family History  Problem Relation Age of Onset  . Diabetes Father   . Hypertension Father   . Heart disease Brother   . Cancer Maternal Grandmother   . Heart disease Brother   . Breast cancer Paternal Aunt   . Cancer Paternal Uncle     Social History Social History   Tobacco Use  . Smoking status: Former Smoker      Types: Cigarettes  . Smokeless tobacco: Never Used  Substance Use Topics  . Alcohol use: Not Currently    Comment: socially  . Drug use: No     Allergies   Keflex [cephalexin]   Review of Systems Review of Systems  Gastrointestinal: Positive for abdominal pain, nausea and vomiting.  Allergic/Immunologic: Positive for immunocompromised state.  All other systems reviewed and are negative.    Physical Exam Updated Vital Signs BP 121/79 (BP Location: Right Arm)   Pulse 100   Temp 98.5 F (36.9 C) (Oral)   Resp 13   Ht 5\' 7"  (1.702 m)   Wt 66 kg   LMP 11/19/2018 (Exact Date)   SpO2 98%   BMI 22.79 kg/m   Physical Exam Vitals signs and nursing note reviewed.  Constitutional:      Appearance: She is well-developed.     Comments: Non toxic  HENT:     Head: Normocephalic and atraumatic.     Nose: Nose normal.     Mouth/Throat:     Comments: Dry lips but MMM  Eyes:     Conjunctiva/sclera: Conjunctivae normal.     Pupils: Pupils are equal, round, and reactive to light.  Neck:     Musculoskeletal: Normal range of motion.  Cardiovascular:     Rate and Rhythm: Normal rate and regular rhythm.  Pulmonary:     Effort: Pulmonary effort is normal.     Breath sounds: Normal breath sounds.  Abdominal:     General: Bowel sounds are normal.     Palpations: Abdomen is soft.     Tenderness: There is abdominal tenderness in the epigastric area. There is guarding.     Comments: Negative Murphy's and McBurney's.  No rigidity or distention.  No suprapubic or CVA tenderness.  Active BS to lower quadrants.   Musculoskeletal: Normal range of motion.  Skin:    General: Skin is warm and dry.     Capillary Refill: Capillary refill takes less than 2 seconds.  Neurological:     Mental Status: She is alert and oriented to person, place, and time.  Psychiatric:        Behavior: Behavior normal.      ED Treatments / Results  Labs (all labs ordered are listed, but only abnormal  results are displayed) Labs Reviewed  CBC WITH DIFFERENTIAL/PLATELET - Abnormal; Notable for the following components:      Result Value   WBC 14.3 (*)    RBC 2.89 (*)    Hemoglobin 8.7 (*)  HCT 26.8 (*)    RDW 17.3 (*)    Platelets 756 (*)    nRBC 1.5 (*)    Neutro Abs 13.2 (*)    Lymphs Abs 0.4 (*)    All other components within normal limits  COMPREHENSIVE METABOLIC PANEL - Abnormal; Notable for the following components:   Sodium 146 (*)    CO2 20 (*)    Glucose, Bld 136 (*)    Total Bilirubin 4.6 (*)    All other components within normal limits  URINALYSIS, ROUTINE W REFLEX MICROSCOPIC - Abnormal; Notable for the following components:   Protein, ur 100 (*)    All other components within normal limits  RETICULOCYTES - Abnormal; Notable for the following components:   Retic Ct Pct 13.0 (*)    RBC. 2.89 (*)    Retic Count, Absolute 359.6 (*)    Immature Retic Fract 29.1 (*)    All other components within normal limits  LACTATE DEHYDROGENASE - Abnormal; Notable for the following components:   LDH 198 (*)    All other components within normal limits  LIPASE, BLOOD  I-STAT BETA HCG BLOOD, ED ([facility], [facility], [facility] ONLY)    EKG None  Radiology No results found.  Procedures Procedures (including critical care time)  Medications Ordered in ED Medications  HYDROmorphone (DILAUDID) injection 1 mg (1 mg Intravenous Refused 11/25/18 1300)    Or  HYDROmorphone (DILAUDID) injection 1 mg ( Subcutaneous See Alternative 11/25/18 1300)  HYDROmorphone (DILAUDID) injection 1 mg (has no administration in time range)    Or  HYDROmorphone (DILAUDID) injection 1 mg (has no administration in time range)  ondansetron (ZOFRAN) injection 4 mg (4 mg Intravenous Given 11/25/18 1001)  morphine 4 MG/ML injection 4 mg (4 mg Intravenous Given 11/25/18 1002)  dextrose 5 % and 0.45% NaCl 5-0.45 % bolus 1,000 mL (0 mLs Intravenous Stopped 11/25/18 1102)  dextrose 5 % and 0.45% NaCl 5-0.45 % bolus 1,000 mL (0 mLs  Intravenous Stopped 11/25/18 1152)  HYDROmorphone (DILAUDID) injection 1 mg (1 mg Intravenous Given 11/25/18 1230)    Or  HYDROmorphone (DILAUDID) injection 1 mg ( Subcutaneous See Alternative 11/25/18 1230)  ketorolac (TORADOL) 30 MG/ML injection 30 mg (30 mg Intravenous Given 11/25/18 1224)     Initial Impression / Assessment and Plan / ED Course  I have reviewed the triage vital signs and the nursing notes.  Pertinent labs & imaging results that were available during my care of the patient were reviewed by me and considered in my medical decision making (see chart for details).  Clinical Course as of Nov 26 1415  Sat Nov 25, 2018  1010 Pulse Rate(!): 120 [CG]  1010 Retic Count, Absolute(!): 359.6 [CG]  1010 Hemoglobin(!): 8.7 [CG]  1010 WBC(!): 14.3 [CG]  1041 Total Bilirubin(!): 4.6 [CG]  1328 LDH(!): 198 [CG]    Clinical Course User Index [CG] Kinnie Feil, PA-C    Highest on ddx is viral process leading to dehydration leading to acute sickle cell pain crisis.  Status post cholecystectomy.  No history of pancreatitis or triggers for this.  She has no lower abdominal tenderness, changes to bowel movements, urinary symptoms making lower GI/GU pathology less likely.  I doubt SBO, appendicitis, diverticulitis.  No peritonitis on exam.  There is no radiating chest pain or cardiac symptoms.  We will obtain labs, urinalysis, treat symptoms and reassess.  1140: tachycardia likely from dehydration has improved with IV fluids.  Hgb at baseline hemoglobin but with elevated total  bilirubin, reticular count, pending LDH.  LFTs WNL.  Mild leukocytosis could be from dehydration and pain.  Labs most consistent with likely vasoocclusive crisis.  I considered splenic sequestration less likely given baseline hgb, no LUQ tenderness, abdominal fullness/distention, CP or shoulder pain. No palpable spleen.  Creatinine, lipase otherwise unremarkable.  Proteinuria noted likely again from dehydration.  At this  time I do not see indication for further labs or imaging.    1211: RN notified me pt has new joint aches 4/10, no nausea/vomiting.  HR back up 120 now. Will initiate sickle cell pain regimen, reassess.   1355: Re-evaluated pt and joint pain is 2/10, tolerating fluids. LDH elevated which fits likely dehydration > acute scd pain crisis.  Dc with symptomatic management. Lengthy discussion about return precautions. Pt is comfortable with this.  Final Clinical Impressions(s) / ED Diagnoses   Final diagnoses:  Epigastric abdominal pain  Sickle cell pain crisis [facility])    ED Discharge Orders         Ordered    OxyCODONE HCl, Abuse Deter, (OXAYDO) 5 MG TABA  Every 4 hours,   Status:  Discontinued     11/25/18 1344    ondansetron (ZOFRAN ODT) 4 MG disintegrating tablet  Every 8 hours PRN     11/25/18 1344    oxyCODONE (OXY IR/ROXICODONE) 5 MG immediate release tablet  Every 4 hours PRN     11/25/18 1417           Kinnie Feil, PA-C 11/25/18 1354    Kinnie Feil, PA-C 11/25/18 1417    Virgel Manifold, MD 11/26/18 1323

## 2018-12-13 ENCOUNTER — Ambulatory Visit (INDEPENDENT_AMBULATORY_CARE_PROVIDER_SITE_OTHER): Payer: BC Managed Care – PPO | Admitting: Family Medicine

## 2018-12-13 ENCOUNTER — Encounter: Payer: Self-pay | Admitting: Family Medicine

## 2018-12-13 VITALS — BP 117/69 | HR 103 | Temp 99.3°F | Resp 14 | Ht 67.0 in | Wt 152.0 lb

## 2018-12-13 DIAGNOSIS — D5700 Hb-SS disease with crisis, unspecified: Secondary | ICD-10-CM

## 2018-12-13 DIAGNOSIS — K219 Gastro-esophageal reflux disease without esophagitis: Secondary | ICD-10-CM | POA: Diagnosis not present

## 2018-12-13 LAB — POCT URINALYSIS DIPSTICK
Bilirubin, UA: NEGATIVE
Blood, UA: NEGATIVE
Glucose, UA: NEGATIVE
Ketones, UA: NEGATIVE
Leukocytes, UA: NEGATIVE
Nitrite, UA: NEGATIVE
Protein, UA: POSITIVE — AB
Spec Grav, UA: 1.01 (ref 1.010–1.025)
Urobilinogen, UA: 1 E.U./dL
pH, UA: 5.5 (ref 5.0–8.0)

## 2018-12-13 MED ORDER — FAMOTIDINE 40 MG PO TABS: 40.0000 mg | ORAL_TABLET | Freq: Every day | ORAL | 0 refills | Status: DC

## 2018-12-13 MED ORDER — KETOROLAC TROMETHAMINE 60 MG/2ML IM SOLN
60.0000 mg | Freq: Once | INTRAMUSCULAR | Status: AC
  Administered 2018-12-13: 60 mg via INTRAMUSCULAR

## 2018-12-13 MED ORDER — FOLIC ACID 1 MG PO TABS: 1.0000 mg | ORAL_TABLET | Freq: Every day | ORAL | 3 refills | Status: DC

## 2018-12-13 NOTE — Patient Instructions (Signed)
Sickle Cell Anemia, Adult °Sickle cell anemia is a condition where your red blood cells are shaped like sickles. Red blood cells carry oxygen through the body. Sickle-shaped cells do not live as long as normal red blood cells. They also clump together and block blood from flowing through the blood vessels. This prevents the body from getting enough oxygen. Sickle cell anemia causes organ damage and pain. It also increases the risk of infection. °Follow these instructions at home: °Medicines °· Take over-the-counter and prescription medicines only as told by your doctor. °· If you were prescribed an antibiotic medicine, take it as told by your doctor. Do not stop taking the antibiotic even if you start to feel better. °· If you develop a fever, do not take medicines to lower the fever right away. Tell your doctor about the fever. °Managing pain, stiffness, and swelling °· Try these methods to help with pain: °? Use a heating pad. °? Take a warm bath. °? Distract yourself, such as by watching TV. °Eating and drinking °· Drink enough fluid to keep your pee (urine) clear or pale yellow. Drink more in hot weather and during exercise. °· Limit or avoid alcohol. °· Eat a healthy diet. Eat plenty of fruits, vegetables, whole grains, and lean protein. °· Take vitamins and supplements as told by your doctor. °Traveling °· When traveling, keep these with you: °? Your medical information. °? The names of your doctors. °? Your medicines. °· If you need to take an airplane, talk to your doctor first. °Activity °· Rest often. °· Avoid exercises that make your heart beat much faster, such as jogging. °General instructions °· Do not use products that have nicotine or tobacco, such as cigarettes and e-cigarettes. If you need help quitting, ask your doctor. °· Consider wearing a medical alert bracelet. °· Avoid being in high places (high altitudes), such as mountains. °· Avoid very hot or cold temperatures. °· Avoid places where the  temperature changes a lot. °· Keep all follow-up visits as told by your doctor. This is important. °Contact a doctor if: °· A joint hurts. °· Your feet or hands hurt or swell. °· You feel tired (fatigued). °Get help right away if: °· You have symptoms of infection. These include: °? Fever. °? Chills. °? Being very tired. °? Irritability. °? Poor eating. °? Throwing up (vomiting). °· You feel dizzy or faint. °· You have new stomach pain, especially on the left side. °· You have a an erection (priapism) that lasts more than 4 hours. °· You have numbness in your arms or legs. °· You have a hard time moving your arms or legs. °· You have trouble talking. °· You have pain that does not go away when you take medicine. °· You are short of breath. °· You are breathing fast. °· You have a long-term cough. °· You have pain in your chest. °· You have a bad headache. °· You have a stiff neck. °· Your stomach looks bloated even though you did not eat much. °· Your skin is pale. °· You suddenly cannot see well. °Summary °· Sickle cell anemia is a condition where your red blood cells are shaped like sickles. °· Follow your doctor's advice on ways to manage pain, food to eat, activities to do, and steps to take for safe travel. °· Get medical help right away if you have any signs of infection, such as a fever. °This information is not intended to replace advice given to you by your   health care provider. Make sure you discuss any questions you have with your health care provider. °Document Released: 07/25/2013 Document Revised: 11/09/2016 Document Reviewed: 11/09/2016 °Elsevier Interactive Patient Education © 2019 [employer] ° °

## 2018-12-13 NOTE — Progress Notes (Signed)
Patient [facility]   Progress Note: Sick Visit Provider: Lanae Boast, FNP  SUBJECTIVE:   Krista [name] is a 33 y.o. female who  has a past medical history of Heart murmur, History of blood transfusion, and Sickle cell anemia ([place]).. Patient presents today for Sickle Cell Anemia  Patient states that she has had an increase in crises over the past few months. She states that she was seen in the ED on 11/25/2018. Patient states that she has been taking ibuprofen without relief. She was given a script for oxycodone in the ED and has not taken medication due to breastfeeding. Patient states that her pain in 5/10 and is in her back.  Review of Systems  Constitutional: Negative.   HENT: Negative.   Eyes: Negative.   Respiratory: Negative.   Cardiovascular: Negative.   Gastrointestinal: Negative.   Genitourinary: Negative.   Musculoskeletal: Positive for back pain and joint pain.  Skin: Negative.   Neurological: Negative.   Psychiatric/Behavioral: Negative.      OBJECTIVE: BP 117/69 (BP Location: Left Arm, Patient Position: Sitting, Cuff Size: Normal)   Pulse (!) 103   Temp 99.3 F (37.4 C) (Oral)   Resp 14   Ht 5\' 7"  (1.702 m)   Wt 152 lb (68.9 kg)   LMP 11/19/2018 (Exact Date)   SpO2 98%   BMI 23.81 kg/m   Wt Readings from Last 3 Encounters:  12/13/18 152 lb (68.9 kg)  11/25/18 145 lb 8.1 oz (66 kg)  06/12/18 158 lb (71.7 kg)     Physical Exam Vitals signs and nursing note reviewed.  Constitutional:      General: She is not in acute distress.    Appearance: She is well-developed.  HENT:     Head: Normocephalic and atraumatic.  Eyes:     Conjunctiva/sclera: Conjunctivae normal.     Pupils: Pupils are equal, round, and reactive to light.  Neck:     Musculoskeletal: Normal range of motion.  Cardiovascular:     Rate and Rhythm: Normal rate and regular rhythm.     Heart sounds: Murmur present.  Pulmonary:     Effort:  Pulmonary effort is normal. No respiratory distress.     Breath sounds: Normal breath sounds.  Abdominal:     General: Bowel sounds are normal. There is no distension.     Palpations: Abdomen is soft.  Musculoskeletal: Normal range of motion.  Skin:    General: Skin is warm and dry.  Neurological:     Mental Status: She is alert and oriented to person, place, and time.  Psychiatric:        Behavior: Behavior normal.        Thought Content: Thought content normal.     ASSESSMENT/PLAN:   1. Hb-SS disease with crisis [facility]) Patient advised to express breast milk tonight. Return for admission to day hospital in the AM.  - Urinalysis Dipstick - folic acid (FOLVITE) 1 MG tablet; Take 1 tablet (1 mg total) by mouth daily.  Dispense: 90 tablet; Refill: 3 - ketorolac (TORADOL) injection 60 mg  2. Gastroesophageal reflux disease, esophagitis presence not specified - famotidine (PEPCID) 40 MG tablet; Take 1 tablet (40 mg total) by mouth daily.  Dispense: 30 tablet; Refill: 0         The patient was given clear instructions to go to ER or return to medical center if symptoms do not improve, worsen or new problems develop. The patient verbalized understanding and  agreed with plan of care.   Ms. Doug Sou. Nathaneil Canary, FNP-BC Patient [facility] [address] [place], [place] [address] [phone]     This note has been created with Dragon speech recognition software and smart phrase technology. Any transcriptional errors are unintentional.

## 2018-12-14 ENCOUNTER — Encounter (HOSPITAL_COMMUNITY): Payer: Self-pay | Admitting: General Practice

## 2018-12-14 ENCOUNTER — Non-Acute Institutional Stay (HOSPITAL_COMMUNITY)
Admission: AD | Admit: 2018-12-14 | Discharge: 2018-12-14 | Disposition: A | Discharge status: Home / Self Care | Payer: BC Managed Care – PPO | Source: Ambulatory Visit | Attending: Internal Medicine | Admitting: Internal Medicine

## 2018-12-14 DIAGNOSIS — Z87891 Personal history of nicotine dependence: Secondary | ICD-10-CM | POA: Insufficient documentation

## 2018-12-14 DIAGNOSIS — Z881 Allergy status to other antibiotic agents status: Secondary | ICD-10-CM | POA: Insufficient documentation

## 2018-12-14 DIAGNOSIS — D5700 Hb-SS disease with crisis, unspecified: Secondary | ICD-10-CM | POA: Diagnosis present

## 2018-12-14 LAB — RETICULOCYTES
Immature Retic Fract: 29.1 % — ABNORMAL HIGH (ref 2.3–15.9)
RBC.: 2.75 MIL/uL — ABNORMAL LOW (ref 3.87–5.11)
Retic Count, Absolute: 365 10*3/uL — ABNORMAL HIGH (ref 19.0–186.0)
Retic Ct Pct: 13.3 % — ABNORMAL HIGH (ref 0.4–3.1)

## 2018-12-14 LAB — CBC WITH DIFFERENTIAL/PLATELET
Abs Immature Granulocytes: 0.1 10*3/uL — ABNORMAL HIGH (ref 0.00–0.07)
BASOS PCT: 1 %
Basophils Absolute: 0.1 10*3/uL (ref 0.0–0.1)
Eosinophils Absolute: 0.1 10*3/uL (ref 0.0–0.5)
Eosinophils Relative: 1 %
HCT: 25.5 % — ABNORMAL LOW (ref 36.0–46.0)
Hemoglobin: 7.5 g/dL — ABNORMAL LOW (ref 12.0–15.0)
Immature Granulocytes: 1 %
Lymphocytes Relative: 23 %
Lymphs Abs: 2.2 10*3/uL (ref 0.7–4.0)
MCH: 30.4 pg (ref 26.0–34.0)
MCHC: 31.6 g/dL (ref 30.0–36.0)
MCV: 96.1 fL (ref 80.0–100.0)
Monocytes Absolute: 0.6 10*3/uL (ref 0.1–1.0)
Monocytes Relative: 6 %
Neutro Abs: 6.6 10*3/uL (ref 1.7–7.7)
Neutrophils Relative %: 68 %
PLATELETS: 802 10*3/uL — AB (ref 150–400)
RBC: 2.75 MIL/uL — ABNORMAL LOW (ref 3.87–5.11)
RDW: 17.2 % — ABNORMAL HIGH (ref 11.5–15.5)
WBC: 9.7 10*3/uL (ref 4.0–10.5)
nRBC: 2.5 % — ABNORMAL HIGH (ref 0.0–0.2)

## 2018-12-14 LAB — COMPREHENSIVE METABOLIC PANEL
ALT: 20 U/L (ref 0–44)
AST: 30 U/L (ref 15–41)
Albumin: 4.9 g/dL (ref 3.5–5.0)
Alkaline Phosphatase: 82 U/L (ref 38–126)
Anion gap: 9 (ref 5–15)
BUN: 6 mg/dL (ref 6–20)
CO2: 22 mmol/L (ref 22–32)
CREATININE: 0.56 mg/dL (ref 0.44–1.00)
Calcium: 9 mg/dL (ref 8.9–10.3)
Chloride: 109 mmol/L (ref 98–111)
GFR calc Af Amer: >60 mL/min (ref >60)
GFR calc non Af Amer: >60 mL/min (ref >60)
Glucose, Bld: 106 mg/dL — ABNORMAL HIGH (ref 70–99)
Potassium: 3.4 mmol/L — ABNORMAL LOW (ref 3.5–5.1)
Sodium: 140 mmol/L (ref 135–145)
TOTAL PROTEIN: 8.1 g/dL (ref 6.5–8.1)
Total Bilirubin: 2.7 mg/dL — ABNORMAL HIGH (ref 0.3–1.2)

## 2018-12-14 LAB — PREGNANCY, URINE: Preg Test, Ur: NEGATIVE

## 2018-12-14 MED ORDER — ONDANSETRON HCL 4 MG/2ML IJ SOLN
4.0000 mg | Freq: Four times a day (QID) | INTRAMUSCULAR | Status: DC | PRN
  Administered 2018-12-14: 4 mg via INTRAVENOUS
  Filled 2018-12-14: qty 2

## 2018-12-14 MED ORDER — HYDROMORPHONE HCL 1 MG/ML IJ SOLN
0.5000 mg | INTRAMUSCULAR | Status: DC | PRN
  Administered 2018-12-14: 0.5 mg via INTRAVENOUS
  Filled 2018-12-14: qty 1

## 2018-12-14 MED ORDER — OXYCODONE HCL 5 MG PO TABS
5.0000 mg | ORAL_TABLET | ORAL | Status: DC | PRN
  Administered 2018-12-14 (×2): 5 mg via ORAL
  Filled 2018-12-14 (×2): qty 1

## 2018-12-14 MED ORDER — KETOROLAC TROMETHAMINE 30 MG/ML IJ SOLN
15.0000 mg | Freq: Once | INTRAMUSCULAR | Status: AC
  Administered 2018-12-14: 15 mg via INTRAVENOUS
  Filled 2018-12-14: qty 1

## 2018-12-14 MED ORDER — ACETAMINOPHEN 500 MG PO TABS
1000.0000 mg | ORAL_TABLET | Freq: Once | ORAL | Status: AC
  Administered 2018-12-14: 1000 mg via ORAL
  Filled 2018-12-14: qty 2

## 2018-12-14 MED ORDER — SODIUM CHLORIDE 0.45 % IV SOLN
INTRAVENOUS | Status: DC
  Administered 2018-12-14: 09:00:00 via INTRAVENOUS

## 2018-12-14 NOTE — H&P (Signed)
[facility]ick[facility] History and Physical   Date: 12/14/2018  Patient name: Krista [name] Medical record number: [id number] Date of birth: 31-Dec-1985 Age: 33 y.o. Gender: female PCP: Lanae Boast, [place]  Attending physician: No att. providers found  Chief Complaint: Generalized pain  History of Present Illness: Krista [name], a 33 year old female with a medical history significant for sickle cell anemia and vitamin D deficiency presents complaining of generalized pain, primarily to joints that is consistent with typical sickle cell crisis.  Patient states that pain started several weeks ago and she has not taken any opiates for pain management due to to the fact that she is breast-feeding.  Patient has been taking ibuprofen and Tylenol consistently without sustained relief.  Current pain intensity 5/10 characterized as intermittent and aching.  Patient endorses soft stools.  She denies headache, chest pain, dizziness, nausea, or vomiting.  Patient also endorses. Patient will be admitted to the infusion center for hydration, pain management, and extended observation  Meds: No medications prior to admission.    Allergies: Keflex [cephalexin] Past Medical History:  Diagnosis Date  . Heart murmur   . History of blood transfusion    x3   . Sickle cell anemia (HCC)    sickle cell crisis 7/12   Past Surgical History:  Procedure Laterality Date  . CHOLECYSTECTOMY     Family History  Problem Relation Age of Onset  . Diabetes Father   . Hypertension Father   . Heart disease Brother   . Cancer Maternal Grandmother   . Heart disease Brother   . Breast cancer Paternal Aunt   . Cancer Paternal Uncle    Social History   Socioeconomic History  . Marital status: Married    Spouse name: n/a  . Number of children: 0  . Years of education: 16+  . Highest education level: Not on file  Occupational History  . Occupation: Product manager: [employer]  Social  Needs  . Financial resource strain: Not on file  . Food insecurity:    Worry: Not on file    Inability: Not on file  . Transportation needs:    Medical: Not on file    Non-medical: Not on file  Tobacco Use  . Smoking status: Former Smoker    Types: Cigarettes  . Smokeless tobacco: Never Used  Substance and Sexual Activity  . Alcohol use: Not Currently    Comment: socially  . Drug use: No  . Sexual activity: Yes  Lifestyle  . Physical activity:    Days per week: Not on file    Minutes per session: Not on file  . Stress: Not on file  Relationships  . Social connections:    Talks on phone: Not on file    Gets together: Not on file    Attends religious service: Not on file    Active member of club or organization: Not on file    Attends meetings of clubs or organizations: Not on file    Relationship status: Not on file  . Intimate partner violence:    Fear of current or ex partner: Not on file    Emotionally abused: Not on file    Physically abused: Not on file    Forced sexual activity: Not on file  Other Topics Concern  . Not on file  Social History Narrative   ** Merged History Encounter **       Lives alone.  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    Review of Systems: .Review of Systems  Constitutional: Positive for malaise/fatigue. Negative for chills and fever.  HENT: Negative.   Eyes: Negative.   Respiratory: Negative for sputum production.   Cardiovascular: Negative.   Gastrointestinal: Positive for diarrhea.  Genitourinary: Negative.   Musculoskeletal: Negative.   Skin: Negative.   Neurological: Negative.   Endo/Heme/Allergies: Negative.   Psychiatric/Behavioral: Negative.      Physical Exam: Blood pressure (!) 105/53, pulse 73, temperature 97.8 F (36.6 C), temperature source Oral, resp. rate 16, last menstrual period 11/19/2018, SpO2 99 %, unknown if currently  breastfeeding. Physical Exam Constitutional:      Appearance: Normal appearance.  HENT:     Head: Normocephalic.     Mouth/Throat:     Mouth: Mucous membranes are moist.  Cardiovascular:     Rate and Rhythm: Normal rate and regular rhythm.     Pulses: Normal pulses.  Pulmonary:     Effort: Pulmonary effort is normal.     Breath sounds: Normal breath sounds.  Abdominal:     General: Abdomen is flat. Bowel sounds are normal.  Skin:    General: Skin is warm and dry.  Neurological:     General: No focal deficit present.     Mental Status: She is alert. Mental status is at baseline.  Psychiatric:        Mood and Affect: Mood normal.        Behavior: Behavior normal.        Thought Content: Thought content normal.        Judgment: Judgment normal.      Lab results: Results for orders placed or performed during the hospital encounter of 12/14/18 (from the past 24 hour(s))  Comprehensive metabolic panel     Status: Abnormal   Collection Time: 12/14/18  8:50 AM  Result Value Ref Range   Sodium 140 135 - 145 mmol/L   Potassium 3.4 (L) 3.5 - 5.1 mmol/L   Chloride 109 98 - 111 mmol/L   CO2 22 22 - 32 mmol/L   Glucose, Bld 106 (H) 70 - 99 mg/dL   BUN 6 6 - 20 mg/dL   Creatinine, Ser 0.56 0.44 - 1.00 mg/dL   Calcium 9.0 8.9 - 10.3 mg/dL   Total Protein 8.1 6.5 - 8.1 g/dL   Albumin 4.9 3.5 - 5.0 g/dL   AST 30 15 - 41 U/L   ALT 20 0 - 44 U/L   Alkaline Phosphatase 82 38 - 126 U/L   Total Bilirubin 2.7 (H) 0.3 - 1.2 mg/dL   GFR calc non Af Amer >60 >60 mL/min   GFR calc Af Amer >60 >60 mL/min   Anion gap 9 5 - 15  CBC WITH DIFFERENTIAL     Status: Abnormal   Collection Time: 12/14/18  8:50 AM  Result Value Ref Range   WBC 9.7 4.0 - 10.5 K/uL   RBC 2.75 (L) 3.87 - 5.11 MIL/uL   Hemoglobin 7.5 (L) 12.0 - 15.0 g/dL   HCT 25.5 (L) 36.0 - 46.0 %   MCV 96.1 80.0 - 100.0 fL   MCH 30.4 26.0 - 34.0 pg   MCHC 31.6 30.0 - 36.0 g/dL   RDW 17.2 (H) 11.5 - 15.5 %   Platelets 802 (H)  150 - 400 K/uL   nRBC 2.5 (H) 0.0 - 0.2 %   Neutrophils Relative % 68 %   Neutro Abs 6.6 1.7 -  7.7 K/uL   Lymphocytes Relative 23 %   Lymphs Abs 2.2 0.7 - 4.0 K/uL   Monocytes Relative 6 %   Monocytes Absolute 0.6 0.1 - 1.0 K/uL   Eosinophils Relative 1 %   Eosinophils Absolute 0.1 0.0 - 0.5 K/uL   Basophils Relative 1 %   Basophils Absolute 0.1 0.0 - 0.1 K/uL   Immature Granulocytes 1 %   Abs Immature Granulocytes 0.10 (H) 0.00 - 0.07 K/uL  Reticulocytes     Status: Abnormal   Collection Time: 12/14/18  8:50 AM  Result Value Ref Range   Retic Ct Pct 13.3 (H) 0.4 - 3.1 %   RBC. 2.75 (L) 3.87 - 5.11 MIL/uL   Retic Count, Absolute 365.0 (H) 19.0 - 186.0 K/uL   Immature Retic Fract 29.1 (H) 2.3 - 15.9 %  Pregnancy, urine     Status: None   Collection Time: 12/14/18  2:00 PM  Result Value Ref Range   Preg Test, Ur NEGATIVE NEGATIVE    Imaging results:  No results found.   Assessment & Plan:  Patient will be admitted to the day infusion center for extended observation  Initiate 0.45% saline at 150 ml/hour for cellular rehydration  Toradol 15 mg IV times one for inflammation.  Tylenol 1000 mg times one  Dilaudid 0.5 mg every 4 hours as needed for severe pain  Oxycodone 5 mg  Patient will be re-evaluated for pain intensity in the context of function and relationship to baseline as care progresses.  If no significant pain relief, will transfer patient to inpatient services for a higher level of care.    Review CBC with differential, CMP and reticulocytes as results become available    [name]  APRN, MSN, FNP-C Patient [facility] [address] [place], [place] [address] [phone]  12/14/2018, 9:24 PM

## 2018-12-14 NOTE — Progress Notes (Signed)
Patient admitted to the day hospital for treatment of sickle cell pain crisis. Patient reported pain rated 5/10 in both legs . Patient given IV Toradol, PO Tylenol, PO Oxycodone, IV Zofran and hydrated with IV fluids. At discharge patient reported pain at 2/10. Discharge instructions given to patient. Patient alert, oriented and ambulatory at discharge.

## 2018-12-14 NOTE — Discharge Instructions (Signed)
Sickle Cell Anemia, Adult °Sickle cell anemia is a condition where your red blood cells are shaped like sickles. Red blood cells carry oxygen through the body. Sickle-shaped cells do not live as long as normal red blood cells. They also clump together and block blood from flowing through the blood vessels. This prevents the body from getting enough oxygen. Sickle cell anemia causes organ damage and pain. It also increases the risk of infection. °Follow these instructions at home: °Medicines °· Take over-the-counter and prescription medicines only as told by your doctor. °· If you were prescribed an antibiotic medicine, take it as told by your doctor. Do not stop taking the antibiotic even if you start to feel better. °· If you develop a fever, do not take medicines to lower the fever right away. Tell your doctor about the fever. °Managing pain, stiffness, and swelling °· Try these methods to help with pain: °? Use a heating pad. °? Take a warm bath. °? Distract yourself, such as by watching TV. °Eating and drinking °· Drink enough fluid to keep your pee (urine) clear or pale yellow. Drink more in hot weather and during exercise. °· Limit or avoid alcohol. °· Eat a healthy diet. Eat plenty of fruits, vegetables, whole grains, and lean protein. °· Take vitamins and supplements as told by your doctor. °Traveling °· When traveling, keep these with you: °? Your medical information. °? The names of your doctors. °? Your medicines. °· If you need to take an airplane, talk to your doctor first. °Activity °· Rest often. °· Avoid exercises that make your heart beat much faster, such as jogging. °General instructions °· Do not use products that have nicotine or tobacco, such as cigarettes and e-cigarettes. If you need help quitting, ask your doctor. °· Consider wearing a medical alert bracelet. °· Avoid being in high places (high altitudes), such as mountains. °· Avoid very hot or cold temperatures. °· Avoid places where the  temperature changes a lot. °· Keep all follow-up visits as told by your doctor. This is important. °Contact a doctor if: °· A joint hurts. °· Your feet or hands hurt or swell. °· You feel tired (fatigued). °Get help right away if: °· You have symptoms of infection. These include: °? Fever. °? Chills. °? Being very tired. °? Irritability. °? Poor eating. °? Throwing up (vomiting). °· You feel dizzy or faint. °· You have new stomach pain, especially on the left side. °· You have a an erection (priapism) that lasts more than 4 hours. °· You have numbness in your arms or legs. °· You have a hard time moving your arms or legs. °· You have trouble talking. °· You have pain that does not go away when you take medicine. °· You are short of breath. °· You are breathing fast. °· You have a long-term cough. °· You have pain in your chest. °· You have a bad headache. °· You have a stiff neck. °· Your stomach looks bloated even though you did not eat much. °· Your skin is pale. °· You suddenly cannot see well. °Summary °· Sickle cell anemia is a condition where your red blood cells are shaped like sickles. °· Follow your doctor's advice on ways to manage pain, food to eat, activities to do, and steps to take for safe travel. °· Get medical help right away if you have any signs of infection, such as a fever. °This information is not intended to replace advice given to you by your   health care provider. Make sure you discuss any questions you have with your health care provider. °Document Released: 07/25/2013 Document Revised: 11/09/2016 Document Reviewed: 11/09/2016 °Elsevier Interactive Patient Education © 2019 Elsevier Inc. ° °

## 2018-12-14 NOTE — Discharge Summary (Signed)
[facility] Discharge Summary   Patient ID: Krista [name] MRN: [id number] DOB/AGE: 1986-10-01 33 y.o.  Admit date: 12/14/2018 Discharge date: 12/14/2018  Primary Care Physician:  Lanae Boast, [place]  Admission Diagnoses:  Active Problems:   Sickle cell pain crisis [facility])   Discharge Medications:  Allergies as of 12/14/2018      Reactions   Keflex [cephalexin] Swelling   Face swelling      Medication List    ASK your doctor about these medications   cetirizine 10 MG tablet Commonly known as:  ZYRTEC Take 10 mg by mouth daily.   cholecalciferol 1000 units tablet Commonly known as:  VITAMIN D Take 1,000 Units by mouth daily.   Vitamin D3 25 MCG (1000 UT) Caps TAKE 1 CAPSULE BY MOUTH ONCE DAILY   famotidine 40 MG tablet Commonly known as:  PEPCID Take 1 tablet (40 mg total) by mouth daily.   folic acid 1 MG tablet Commonly known as:  FOLVITE Take 1 tablet (1 mg total) by mouth daily.   ondansetron 4 MG disintegrating tablet Commonly known as:  ZOFRAN ODT Take 1 tablet (4 mg total) by mouth every 8 (eight) hours as needed for nausea or vomiting.   prenatal multivitamin Tabs tablet Take 1 tablet by mouth daily at 12 noon.        Consults:  None  Significant Diagnostic Studies:  No results found.   [facility] Course: Krista [name], a 33 year old female with a medical history significant for sickle cell anemia and vitamin D deficiency was admitted for pain management, hydration, and extended observation.  Reviewed all laboratory values.  Hemoglobin 7.5, which is slightly below baseline.  No indication for blood transfusion during admission.  WBCs 13.0, appears to be consistent with patient's baseline.  Patient afebrile and maintaining oxygen saturation above 90%.  All other vital signs within normal limits.  Patient hydrated with 0.45% saline at 150 mL/h. Toradol IV 15 mg x 1 Tylenol 1000 mg x 1 Oxycodone 5 mg x 2 Dilaudid 0.5  mg x 2  Patient's pain intensity decreased to 3/10, which is consistent with with patient's goal.  Patient is alert, oriented, and ambulating without assistance.  Patient will discharge home in a hemodynamically stable condition.  Discharge instructions: Patient advised to follow-up to repeat CBC in 1 week Patient will resume all home medications.  Patient will follow-up with Lanae Boast, FNP as scheduled  The patient was given clear instructions to go to ER or return to medical center if symptoms do not improve, worsen or new problems develop.   Physical Exam at Discharge:  BP (!) 105/53 (BP Location: Right Arm)   Pulse 73   Temp 97.8 F (36.6 C) (Oral)   Resp 16   LMP 11/19/2018 (Exact Date)   SpO2 99%   Physical Exam Constitutional:      General: She is not in acute distress.    Appearance: Normal appearance.  HENT:     Mouth/Throat:     Mouth: Mucous membranes are moist.  Eyes:     Pupils: Pupils are equal, round, and reactive to light.  Cardiovascular:     Rate and Rhythm: Normal rate and regular rhythm.     Pulses: Normal pulses.     Heart sounds: Normal heart sounds.  Pulmonary:     Effort: Pulmonary effort is normal.     Breath sounds: Normal breath sounds.  Abdominal:     General: Bowel sounds are normal.  Skin:  General: Skin is warm and dry.  Neurological:     General: No focal deficit present.     Mental Status: She is alert and oriented to person, place, and time. Mental status is at baseline.  Psychiatric:        Mood and Affect: Mood normal.        Behavior: Behavior normal.        Thought Content: Thought content normal.        Judgment: Judgment normal.      Disposition at Discharge:   Discharge Orders:   Condition at Discharge:   Stable  Time spent on Discharge:  Greater than 30 minutes.  Signed:  [name]  APRN, MSN, FNP-C Patient [facility] [address] [place], [place]  [address] [phone] 12/14/2018, 9:16 PM

## 2018-12-21 ENCOUNTER — Ambulatory Visit (HOSPITAL_COMMUNITY)
Admission: RE | Admit: 2018-12-21 | Discharge: 2018-12-21 | Disposition: A | Discharge status: Home / Self Care | Payer: BC Managed Care – PPO | Source: Ambulatory Visit | Attending: Family Medicine | Admitting: Family Medicine

## 2018-12-21 ENCOUNTER — Other Ambulatory Visit: Payer: Self-pay | Admitting: Family Medicine

## 2018-12-21 DIAGNOSIS — D571 Sickle-cell disease without crisis: Secondary | ICD-10-CM | POA: Diagnosis not present

## 2018-12-21 LAB — CBC
HCT: 24.9 % — ABNORMAL LOW (ref 36.0–46.0)
Hemoglobin: 8 g/dL — ABNORMAL LOW (ref 12.0–15.0)
MCH: 30.7 pg (ref 26.0–34.0)
MCHC: 32.1 g/dL (ref 30.0–36.0)
MCV: 95.4 fL (ref 80.0–100.0)
NRBC: 4.2 % — AB (ref 0.0–0.2)
Platelets: 883 10*3/uL — ABNORMAL HIGH (ref 150–400)
RBC: 2.61 MIL/uL — ABNORMAL LOW (ref 3.87–5.11)
RDW: 20.6 % — ABNORMAL HIGH (ref 11.5–15.5)
WBC: 12.8 10*3/uL — ABNORMAL HIGH (ref 4.0–10.5)

## 2018-12-21 NOTE — Progress Notes (Signed)
Patient came for CBC lab draw as ordered. Patient tolerated well and vitals was stable. Discharge instructions given,   alert, oriented and ambulatory at discharge.

## 2018-12-21 NOTE — Discharge Instructions (Signed)
CBC lab draw

## 2019-01-15 ENCOUNTER — Telehealth: Payer: Self-pay

## 2019-01-15 DIAGNOSIS — Z3201 Encounter for pregnancy test, result positive: Secondary | ICD-10-CM

## 2019-01-15 NOTE — Telephone Encounter (Signed)
She will need to be referred to a high risk OBGYN for care due to sickle cell anemia. Will refer.

## 2019-01-16 NOTE — Telephone Encounter (Signed)
Patient notified

## 2019-01-16 NOTE — Telephone Encounter (Signed)
Left a vm for patient

## 2019-02-16 NOTE — Telephone Encounter (Signed)
Message sent to provider 

## 2019-02-19 ENCOUNTER — Telehealth: Payer: Self-pay

## 2019-02-19 ENCOUNTER — Other Ambulatory Visit: Payer: Self-pay | Admitting: Family Medicine

## 2019-02-19 NOTE — Telephone Encounter (Signed)
Patient called to come in for Labs and has appointment scheduled for 02/20/2019@9am . Thanks!

## 2019-02-19 NOTE — Telephone Encounter (Signed)
Patient had a recent TB test at a urgent care and had a reaction. She needs an order for a Chest X-Ray, can you order this for her?  Also she had a recent miscarriage and was told by ob/gyn to get hemoglobin checked before trying to conceive again. Can this be ordered? Please advise. Thanks!

## 2019-02-20 ENCOUNTER — Other Ambulatory Visit: Payer: BC Managed Care – PPO

## 2019-02-20 ENCOUNTER — Other Ambulatory Visit: Payer: Self-pay

## 2019-02-20 DIAGNOSIS — D5700 Hb-SS disease with crisis, unspecified: Secondary | ICD-10-CM

## 2019-02-21 LAB — CBC WITH DIFFERENTIAL/PLATELET
Basophils Absolute: 0.1 10*3/uL (ref 0.0–0.2)
Basos: 1 %
EOS (ABSOLUTE): 0 10*3/uL (ref 0.0–0.4)
Eos: 0 %
Hematocrit: 26 % — ABNORMAL LOW (ref 34.0–46.6)
Hemoglobin: 9 g/dL — ABNORMAL LOW (ref 11.1–15.9)
Immature Grans (Abs): 0.1 10*3/uL (ref 0.0–0.1)
Immature Granulocytes: 1 %
Lymphocytes Absolute: 3 10*3/uL (ref 0.7–3.1)
Lymphs: 28 %
MCH: 31.5 pg (ref 26.6–33.0)
MCHC: 34.6 g/dL (ref 31.5–35.7)
MCV: 91 fL (ref 79–97)
Monocytes Absolute: 0.6 10*3/uL (ref 0.1–0.9)
Monocytes: 6 %
NRBC: 4 % — ABNORMAL HIGH (ref 0–0)
Neutrophils Absolute: 6.9 10*3/uL (ref 1.4–7.0)
Neutrophils: 64 %
Platelets: 774 10*3/uL — ABNORMAL HIGH (ref 150–450)
RBC: 2.86 x10E6/uL — ABNORMAL LOW (ref 3.77–5.28)
RDW: 14.5 % (ref 11.7–15.4)
WBC: 10.8 10*3/uL (ref 3.4–10.8)

## 2019-02-21 LAB — HCG, SERUM, QUALITATIVE: hCG,Beta Subunit,Qual,Serum: NEGATIVE m[IU]/mL (ref <6)

## 2019-02-22 ENCOUNTER — Telehealth: Payer: Self-pay

## 2019-02-22 ENCOUNTER — Other Ambulatory Visit: Payer: Self-pay | Admitting: Family Medicine

## 2019-02-22 DIAGNOSIS — R7611 Nonspecific reaction to tuberculin skin test without active tuberculosis: Secondary | ICD-10-CM

## 2019-02-22 NOTE — Telephone Encounter (Signed)
Called and spoke with patient, advised that xray is ordered and she can go next week to radiology to have this completed. Thanks!

## 2019-02-22 NOTE — Telephone Encounter (Signed)
Patient called and is asking if her chest xray can be ordered since labs are back. She needed this for a positive TB test she had recently. Please advise.

## 2019-02-28 ENCOUNTER — Ambulatory Visit (HOSPITAL_COMMUNITY)
Admission: RE | Admit: 2019-02-28 | Discharge: 2019-02-28 | Disposition: A | Discharge status: Home / Self Care | Payer: BC Managed Care – PPO | Source: Ambulatory Visit | Attending: Family Medicine | Admitting: Family Medicine

## 2019-02-28 ENCOUNTER — Other Ambulatory Visit: Payer: Self-pay

## 2019-02-28 DIAGNOSIS — R7611 Nonspecific reaction to tuberculin skin test without active tuberculosis: Secondary | ICD-10-CM | POA: Diagnosis present

## 2019-06-13 ENCOUNTER — Ambulatory Visit (INDEPENDENT_AMBULATORY_CARE_PROVIDER_SITE_OTHER): Payer: BC Managed Care – PPO | Admitting: Family Medicine

## 2019-06-13 ENCOUNTER — Other Ambulatory Visit: Payer: Self-pay

## 2019-06-13 ENCOUNTER — Encounter: Payer: Self-pay | Admitting: Family Medicine

## 2019-06-13 VITALS — BP 116/55 | HR 86 | Temp 99.3°F | Resp 14 | Ht 67.0 in | Wt 162.0 lb

## 2019-06-13 DIAGNOSIS — D571 Sickle-cell disease without crisis: Secondary | ICD-10-CM | POA: Diagnosis not present

## 2019-06-13 DIAGNOSIS — N912 Amenorrhea, unspecified: Secondary | ICD-10-CM

## 2019-06-13 NOTE — Patient Instructions (Signed)
Sickle Cell Anemia, Adult ° °Sickle cell anemia is a condition where your red blood cells are shaped like sickles. Red blood cells carry oxygen through the body. Sickle-shaped cells do not live as long as normal red blood cells. They also clump together and block blood from flowing through the blood vessels. This prevents the body from getting enough oxygen. Sickle cell anemia causes organ damage and pain. It also increases the risk of infection. °Follow these instructions at home: °Medicines °· Take over-the-counter and prescription medicines only as told by your doctor. °· If you were prescribed an antibiotic medicine, take it as told by your doctor. Do not stop taking the antibiotic even if you start to feel better. °· If you develop a fever, do not take medicines to lower the fever right away. Tell your doctor about the fever. °Managing pain, stiffness, and swelling °· Try these methods to help with pain: °? Use a heating pad. °? Take a warm bath. °? Distract yourself, such as by watching TV. °Eating and drinking °· Drink enough fluid to keep your pee (urine) clear or pale yellow. Drink more in hot weather and during exercise. °· Limit or avoid alcohol. °· Eat a healthy diet. Eat plenty of fruits, vegetables, whole grains, and lean protein. °· Take vitamins and supplements as told by your doctor. °Traveling °· When traveling, keep these with you: °? Your medical information. °? The names of your doctors. °? Your medicines. °· If you need to take an airplane, talk to your doctor first. °Activity °· Rest often. °· Avoid exercises that make your heart beat much faster, such as jogging. °General instructions °· Do not use products that have nicotine or tobacco, such as cigarettes and e-cigarettes. If you need help quitting, ask your doctor. °· Consider wearing a medical alert bracelet. °· Avoid being in high places (high altitudes), such as mountains. °· Avoid very hot or cold temperatures. °· Avoid places where the  temperature changes a lot. °· Keep all follow-up visits as told by your doctor. This is important. °Contact a doctor if: °· A joint hurts. °· Your feet or hands hurt or swell. °· You feel tired (fatigued). °Get help right away if: °· You have symptoms of infection. These include: °? Fever. °? Chills. °? Being very tired. °? Irritability. °? Poor eating. °? Throwing up (vomiting). °· You feel dizzy or faint. °· You have new stomach pain, especially on the left side. °· You have a an erection (priapism) that lasts more than 4 hours. °· You have numbness in your arms or legs. °· You have a hard time moving your arms or legs. °· You have trouble talking. °· You have pain that does not go away when you take medicine. °· You are short of breath. °· You are breathing fast. °· You have a long-term cough. °· You have pain in your chest. °· You have a bad headache. °· You have a stiff neck. °· Your stomach looks bloated even though you did not eat much. °· Your skin is pale. °· You suddenly cannot see well. °Summary °· Sickle cell anemia is a condition where your red blood cells are shaped like sickles. °· Follow your doctor's advice on ways to manage pain, food to eat, activities to do, and steps to take for safe travel. °· Get medical help right away if you have any signs of infection, such as a fever. °This information is not intended to replace advice given to you by   your health care provider. Make sure you discuss any questions you have with your health care provider. °Document Released: 07/25/2013 Document Revised: 01/26/2019 Document Reviewed: 11/09/2016 °Elsevier Patient Education © 2020 Elsevier Inc. ° °

## 2019-06-13 NOTE — Progress Notes (Signed)
PATIENT [facility]ARE CENTER INTERNAL MEDICINE AND SICKLE CELL CARE  SICKLE CELL ANEMIA FOLLOW UP VISIT PROVIDER: Lanae Boast, FNP    Subjective:   Krista [name]  is a 33 y.o.  female who  has a past medical history of Heart murmur, History of blood transfusion, and Sickle cell anemia ([place]). presents for a follow up for Sickle Cell Anemia. The patient has had 1 admission in the past 6 months.  Pain regimen includes: Acetaminophen  Hydrea Therapy: No Medication compliance: Yes  Pain today is 0/10  The patient reports adequate daily hydration.  Patient reports that she had a positive pregnancy test this week.   Review of Systems  Constitutional: Negative.   HENT: Negative.   Eyes: Negative.   Respiratory: Negative.   Cardiovascular: Negative.   Gastrointestinal: Negative.   Genitourinary: Negative.   Musculoskeletal: Negative.   Skin: Negative.   Neurological: Negative.   Psychiatric/Behavioral: Negative.     Objective:   Objective  BP (!) 116/55 (BP Location: Left Arm, Patient Position: Sitting, Cuff Size: Normal)   Pulse 86   Temp 99.3 F (37.4 C) (Oral)   Resp 14   Ht 5\' 7"  (1.702 m)   Wt 162 lb (73.5 kg)   LMP 05/16/2019   SpO2 100%   BMI 25.37 kg/m   Wt Readings from Last 3 Encounters:  06/13/19 162 lb (73.5 kg)  12/13/18 152 lb (68.9 kg)  11/25/18 145 lb 8.1 oz (66 kg)     Physical Exam Vitals signs and nursing note reviewed.  Constitutional:      General: She is not in acute distress.    Appearance: Normal appearance.  HENT:     Head: Normocephalic and atraumatic.  Eyes:     Extraocular Movements: Extraocular movements intact.     Conjunctiva/sclera: Conjunctivae normal.     Pupils: Pupils are equal, round, and reactive to light.  Cardiovascular:     Rate and Rhythm: Normal rate and regular rhythm.     Heart sounds: No murmur.  Pulmonary:     Effort: Pulmonary effort is normal.     Breath sounds: Normal breath sounds.  Musculoskeletal: Normal  range of motion.  Skin:    General: Skin is warm and dry.  Neurological:     Mental Status: She is alert and oriented to person, place, and time.  Psychiatric:        Mood and Affect: Mood normal.        Behavior: Behavior normal.        Thought Content: Thought content normal.        Judgment: Judgment normal.      Assessment/Plan:   Assessment   Encounter Diagnoses  Name Primary?  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 Amenorrhea      Plan  1. Hb-SS disease without crisis ([place]) - CBC with Differential - Comprehensive metabolic panel  2. Amenorrhea Patient to follow up with OBGYN. She started progesterone suppositories already.  - hCG, serum, qualitative   Return to care as scheduled and prn. Patient verbalized understanding and agreed with plan of care.   1. Sickle cell disease -   We discussed the need for good hydration, monitoring of hydration status, avoidance of heat, cold, stress, and infection triggers. We discussed the risks and benefits of Hydrea, including bone marrow suppression, the possibility of GI upset, skin ulcers, hair thinning, and teratogenicity. The patient was reminded of the need to seek medical attention of any symptoms of bleeding,  anemia, or infection. Continue folic acid 1 mg daily to prevent aplastic bone marrow crises.   2. Pulmonary evaluation - Patient denies severe recurrent wheezes, shortness of breath with exercise, or persistent cough. If these symptoms develop, pulmonary function tests with spirometry will be ordered, and if abnormal, plan on referral to Pulmonology for further evaluation.  3. Cardiac - Routine screening for pulmonary hypertension is not recommended.  4. Eye - High risk of proliferative retinopathy. Annual eye exam with retinal exam recommended to patient.  5. Immunization status -  Yearly influenza vaccination is recommended, as well as being up to date with Meningococcal and Pneumococcal vaccines.   6. Acute  and chronic painful episodes - We discussed that pt is to receive Schedule II prescriptions only from [place]. Pt is also aware that the prescription history is available to [place] online through the [facility] CSRS. Controlled substance agreement signed. We reminded Devaney Lacayo that all patients receiving Schedule II narcotics must be seen for follow within one month of prescription being requested. We reviewed the terms of our pain agreement, including the need to keep medicines in a safe locked location away from children or pets, and the need to report excess sedation or constipation, measures to avoid constipation, and policies related to early refills and stolen prescriptions. According to the [place] [facility] program, we have reviewed details related to analgesia, adverse effects, aberrant behaviors.  7. Iron overload from chronic transfusion.  Not applicable at this time.  If this occurs will use Exjade for management.   8. Vitamin D deficiency - Drisdol 50,000 units weekly. Patient encouraged to take as prescribed.   The above recommendations are taken from the NIH Evidence-Based Management of Sickle Cell Disease: Expert Panel Report, 20149.   [name], FNP-BC [facility] [address] [place],  [address] [phone]  This note has been created with Dragon speech recognition software and smart phrase technology. Any transcriptional errors are unintentional.

## 2019-06-14 LAB — CBC WITH DIFFERENTIAL/PLATELET
Basophils Absolute: 0.1 10*3/uL (ref 0.0–0.2)
Basos: 1 %
EOS (ABSOLUTE): 0 10*3/uL (ref 0.0–0.4)
Eos: 0 %
Hematocrit: 24.3 % — ABNORMAL LOW (ref 34.0–46.6)
Hemoglobin: 8.4 g/dL — ABNORMAL LOW (ref 11.1–15.9)
Immature Grans (Abs): 0.1 10*3/uL (ref 0.0–0.1)
Immature Granulocytes: 1 %
Lymphocytes Absolute: 4.4 10*3/uL — ABNORMAL HIGH (ref 0.7–3.1)
Lymphs: 38 %
MCH: 31.1 pg (ref 26.6–33.0)
MCHC: 34.6 g/dL (ref 31.5–35.7)
MCV: 90 fL (ref 79–97)
Monocytes Absolute: 0.9 10*3/uL (ref 0.1–0.9)
Monocytes: 8 %
NRBC: 3 % — ABNORMAL HIGH (ref 0–0)
Neutrophils Absolute: 6.1 10*3/uL (ref 1.4–7.0)
Neutrophils: 52 %
Platelets: 730 10*3/uL — ABNORMAL HIGH (ref 150–450)
RBC: 2.7 x10E6/uL — CL (ref 3.77–5.28)
RDW: 14.9 % (ref 11.7–15.4)
WBC: 11.6 10*3/uL — ABNORMAL HIGH (ref 3.4–10.8)

## 2019-06-14 LAB — COMPREHENSIVE METABOLIC PANEL
ALT: 12 IU/L (ref 0–32)
AST: 14 IU/L (ref 0–40)
Albumin/Globulin Ratio: 2.1 (ref 1.2–2.2)
Albumin: 4.7 g/dL (ref 3.8–4.8)
Alkaline Phosphatase: 70 IU/L (ref 39–117)
BUN/Creatinine Ratio: 9 (ref 9–23)
BUN: 5 mg/dL — ABNORMAL LOW (ref 6–20)
Bilirubin Total: 2.4 mg/dL — ABNORMAL HIGH (ref 0.0–1.2)
CO2: 20 mmol/L (ref 20–29)
Calcium: 9.3 mg/dL (ref 8.7–10.2)
Chloride: 104 mmol/L (ref 96–106)
Creatinine, Ser: 0.53 mg/dL — ABNORMAL LOW (ref 0.57–1.00)
GFR calc Af Amer: 145 mL/min/{1.73_m2} (ref >59)
GFR calc non Af Amer: 126 mL/min/{1.73_m2} (ref >59)
Globulin, Total: 2.2 g/dL (ref 1.5–4.5)
Glucose: 79 mg/dL (ref 65–99)
Potassium: 4.3 mmol/L (ref 3.5–5.2)
Sodium: 141 mmol/L (ref 134–144)
Total Protein: 6.9 g/dL (ref 6.0–8.5)

## 2019-06-14 LAB — HCG, SERUM, QUALITATIVE: hCG,Beta Subunit,Qual,Serum: POSITIVE m[IU]/mL — AB (ref <6)

## 2019-06-27 ENCOUNTER — Encounter (HOSPITAL_COMMUNITY): Payer: Self-pay

## 2019-06-27 ENCOUNTER — Encounter (HOSPITAL_COMMUNITY): Payer: Self-pay | Admitting: *Deleted

## 2019-07-13 ENCOUNTER — Other Ambulatory Visit: Payer: Self-pay

## 2019-07-13 ENCOUNTER — Other Ambulatory Visit (HOSPITAL_COMMUNITY)
Admission: RE | Admit: 2019-07-13 | Discharge: 2019-07-13 | Disposition: A | Discharge status: Home / Self Care | Payer: BC Managed Care – PPO | Source: Ambulatory Visit | Attending: Obstetrics and Gynecology | Admitting: Obstetrics and Gynecology

## 2019-07-13 ENCOUNTER — Encounter (HOSPITAL_BASED_OUTPATIENT_CLINIC_OR_DEPARTMENT_OTHER): Payer: Self-pay | Admitting: Physician Assistant

## 2019-07-13 ENCOUNTER — Encounter (HOSPITAL_BASED_OUTPATIENT_CLINIC_OR_DEPARTMENT_OTHER): Payer: Self-pay | Admitting: *Deleted

## 2019-07-13 DIAGNOSIS — Z01812 Encounter for preprocedural laboratory examination: Secondary | ICD-10-CM | POA: Insufficient documentation

## 2019-07-13 DIAGNOSIS — Z20828 Contact with and (suspected) exposure to other viral communicable diseases: Secondary | ICD-10-CM | POA: Diagnosis not present

## 2019-07-13 NOTE — Progress Notes (Signed)
Spoke w/ pt via phone for pre-op interview.  Npo after mn.  Arrive at 0530.  Needs istat.  Pt getting covid test done today.  Pre-op orders pending.   PCP -  Lanae Boast FNP (lov 06-13-2019 in epic) Cardiologist -  no  Chest x-ray -  02-28-2019 epic (for positive PPD skin test) EKG -  05-13-2017 epic Stress Test -  no ECHO -  no Cardiac Cath -  no  Sleep Study -  no CPAP -   Fasting Blood Sugar -  n/a Checks Blood Sugar _____ times a day  Blood Thinner Instructions: no Aspirin Instructions:  no Last Dose:  Anesthesia review:  hx SSA,  last crisis 12-14-2018, currently pt denies any symptoms. Also, denies any cardiac s&s.  Hx asymptomatic heart murmur since chidhood, never had echo before.  Patient denies shortness of breath, fever, cough and chest pain at phone interview.

## 2019-07-14 LAB — NOVEL CORONAVIRUS, NAA (HOSP ORDER, SEND-OUT TO REF LAB; TAT 18-24 HRS): SARS-CoV-2, NAA: NOT DETECTED

## 2019-07-14 NOTE — H&P (Signed)
Krista [name] is an 33 y.o. female G57P1 with anembryonic pregnancy and RPL presents for surgical mngt.  No vb or pain     Menstrual History: Patient's last menstrual period was 05/16/2019.    Past Medical History:  Diagnosis Date  . Chronic pain    with sickle cell   . Eczema   . Heart murmur    07-13-2019  per pt since childhood,  has not ever had a echo done ,  asymptomatic  . History of blood transfusion    x3   . Missed ab   . Sickle cell anemia ([name])    followed by pcp---  last crisis 12-14-2018  . Vitamin D deficiency   . Wears glasses     Past Surgical History:  Procedure Laterality Date  . LAPAROSCOPIC CHOLECYSTECTOMY  1997    Family History  Problem Relation Age of Onset  . Diabetes Father   . Hypertension Father   . Heart disease Brother   . Cancer Maternal Grandmother   . Heart disease Brother   . Breast cancer Paternal Aunt   . Cancer Paternal Uncle     Social History:  reports that she has never smoked. She has never used smokeless tobacco. She reports previous alcohol use. She reports that she does not use drugs.  Allergies:  Allergies  Allergen Reactions  . Keflex [Cephalexin] Swelling    Face swelling    No medications prior to admission.    ROS  Height 5\' 7"  (1.702 m), weight 73.5 kg, last menstrual period 05/16/2019, not currently breastfeeding. Physical Exam  Gen - NAD Abd - soft, NT CV - RRR Lungs - clear  Results for orders placed or performed during the hospital encounter of 07/13/19 (from the past 24 hour(s))  Novel Coronavirus, NAA (Hosp order, Send-out to Ref Lab; TAT 18-24 hrs     Status: None   Collection Time: 07/13/19  3:13 PM   Specimen: Nasopharyngeal Swab; Respiratory  Result Value Ref Range   SARS-CoV-2, NAA NOT DETECTED NOT DETECTED   Coronavirus Source NASOPHARYNGEAL      Assessment/Plan: RPL  Plan for D&E with genetic studies  Krista [name] 07/14/2019, 9:58 AM

## 2019-07-16 ENCOUNTER — Other Ambulatory Visit: Payer: Self-pay | Admitting: Family Medicine

## 2019-07-16 ENCOUNTER — Telehealth (HOSPITAL_COMMUNITY): Payer: Self-pay | Admitting: General Practice

## 2019-07-16 DIAGNOSIS — D5700 Hb-SS disease with crisis, unspecified: Secondary | ICD-10-CM

## 2019-07-16 MED ORDER — IBUPROFEN 600 MG PO TABS: 600.0000 mg | ORAL_TABLET | Freq: Three times a day (TID) | ORAL | 0 refills | Status: DC | PRN

## 2019-07-16 MED ORDER — OXYCODONE HCL 5 MG PO CAPS: 5.0000 mg | ORAL_CAPSULE | ORAL | 0 refills | Status: DC | PRN

## 2019-07-16 NOTE — Anesthesia Preprocedure Evaluation (Deleted)
Anesthesia Evaluation  Patient identified by MRN, date of birth, ID band Patient awake    Reviewed: Allergy & Precautions, NPO status , Patient's Chart, lab work & pertinent test results  Airway Mallampati: II  TM Distance: >3 FB Neck ROM: Full    Dental no notable dental hx. (+) Teeth Intact, Dental Advisory Given   Pulmonary neg pulmonary ROS, former smoker,    Pulmonary exam normal breath sounds clear to auscultation       Cardiovascular negative cardio ROS Normal cardiovascular exam Rhythm:Regular Rate:Normal     Neuro/Psych negative neurological ROS  negative psych ROS   GI/Hepatic GERD  ,  Endo/Other    Renal/GU      Musculoskeletal   Abdominal   Peds  Hematology  (+) Sickle cell anemia and anemia ,   Anesthesia Other Findings   Reproductive/Obstetrics (+) Pregnancy                             Lab Results  Component Value Date   WBC 11.6 (H) 06/13/2019   HGB 8.4 (L) 06/13/2019   HCT 24.3 (L) 06/13/2019   MCV 90 06/13/2019   PLT 730 (H) 06/13/2019    Anesthesia Physical  Anesthesia Plan  ASA: II  Anesthesia Plan: General   Post-op Pain Management:    Induction:   PONV Risk Score and Plan: 4 or greater and Ondansetron, Dexamethasone, Treatment may vary due to age or medical condition, Midazolam and Scopolamine patch - Pre-op  Airway Management Planned: LMA  Additional Equipment: None  Intra-op Plan:   Post-operative Plan: Extubation in OR  Informed Consent: I have reviewed the patients History and Physical, chart, labs and discussed the procedure including the risks, benefits and alternatives for the proposed anesthesia with the patient or authorized representative who has indicated his/her understanding and acceptance.     Dental advisory given  Plan Discussed with: CRNA  Anesthesia Plan Comments:         Anesthesia Quick Evaluation

## 2019-07-16 NOTE — Progress Notes (Signed)
Heather, from [facility], called to say that case is cancelled due to patient passing contents.

## 2019-07-16 NOTE — Progress Notes (Signed)
Meds ordered this encounter  Medications  . oxycodone (OXY-IR) 5 MG capsule    Sig: Take 1 capsule (5 mg total) by mouth every 4 (four) hours as needed.    Dispense:  20 capsule    Refill:  0    Order Specific Question:   Supervising Provider    Answer:   Tresa Garter [id number]  . ibuprofen (ADVIL) 600 MG tablet    Sig: Take 1 tablet (600 mg total) by mouth every 8 (eight) hours as needed.    Dispense:  30 tablet    Refill:  0    Order Specific Question:   Supervising Provider    Answer:   Tresa Garter [id number]     [name]  APRN, MSN, FNP-C [facility] [address] [place], [place] [address] [phone]

## 2019-07-16 NOTE — Telephone Encounter (Signed)
Patient called, complained of pain in the knees and hips rated at 9/10. Denied chest pain, fever, diarrhea, abdominal pain, nausea/vomitting. Screened negative for Covid-19 symptoms. Admitted to having means of transportation without driving self after treatment. Last took 1500 mg of Tylenol at 06:50 today. Patient endorsed "miscarriage" and actively "actively bleeding". Notified the provider, Cammie Sickle FNP is going to call the patient to advice on what the patient should do.  Patient notified, verbalized understanding.

## 2019-07-17 ENCOUNTER — Ambulatory Visit (HOSPITAL_BASED_OUTPATIENT_CLINIC_OR_DEPARTMENT_OTHER)
Admission: RE | Admit: 2019-07-17 | Payer: BC Managed Care – PPO | Source: Ambulatory Visit | Admitting: Obstetrics and Gynecology

## 2019-07-17 HISTORY — DX: Dermatitis, unspecified: L30.9

## 2019-07-17 HISTORY — DX: Other chronic pain: G89.29

## 2019-07-17 HISTORY — DX: Vitamin D deficiency, unspecified: E55.9

## 2019-07-17 HISTORY — DX: Missed abortion: O02.1

## 2019-07-17 HISTORY — DX: Presence of spectacles and contact lenses: Z97.3

## 2019-07-17 SURGERY — DILATION AND EVACUATION, UTERUS
Anesthesia: Choice

## 2019-07-19 ENCOUNTER — Telehealth: Payer: Self-pay

## 2019-07-19 NOTE — Telephone Encounter (Signed)
error 

## 2019-08-10 ENCOUNTER — Telehealth: Payer: Self-pay

## 2019-08-10 NOTE — Telephone Encounter (Signed)
Patient called today and is asking for a note for work. 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 She is worried about returning into class room due to her being high risk. She is asking if you can write a note for work explaining that it is safer for her to not be in direct contact in the class room. Please advise.

## 2019-08-10 NOTE — Telephone Encounter (Signed)
Patient advised. She is scheduled for 08/21/2019.

## 2019-08-10 NOTE — Telephone Encounter (Signed)
-----   Message from Dorena Dew, [place] sent at 08/10/2019  9:52 AM EDT ----- I have not seen this patient in primary care or admitted to day hospital. I will need to evaluate her prior to writing a letter. It has been far greater than a year since I have seen this patient. I have given her advice over the phone when she called the day hospital.   Have her schedule a first available appointment. In the meantime, wear a face mask and eye protection (face shield or safety glasses) when in direct contact with others.    [name]  APRN, MSN, FNP-C [facility] [address] [place], [place] [address] [phone]

## 2019-08-21 ENCOUNTER — Encounter: Payer: Self-pay | Admitting: Family Medicine

## 2019-08-21 ENCOUNTER — Other Ambulatory Visit: Payer: Self-pay

## 2019-08-21 ENCOUNTER — Ambulatory Visit (INDEPENDENT_AMBULATORY_CARE_PROVIDER_SITE_OTHER): Payer: BC Managed Care – PPO | Admitting: Family Medicine

## 2019-08-21 VITALS — BP 115/69 | HR 85 | Temp 99.3°F | Resp 14 | Ht 67.0 in | Wt 163.0 lb

## 2019-08-21 DIAGNOSIS — R21 Rash and other nonspecific skin eruption: Secondary | ICD-10-CM

## 2019-08-21 DIAGNOSIS — E559 Vitamin D deficiency, unspecified: Secondary | ICD-10-CM

## 2019-08-21 DIAGNOSIS — Z23 Encounter for immunization: Secondary | ICD-10-CM

## 2019-08-21 DIAGNOSIS — D5700 Hb-SS disease with crisis, unspecified: Secondary | ICD-10-CM

## 2019-08-21 MED ORDER — MOMETASONE FUROATE 0.1 % EX CREA: 1.0000 "application " | TOPICAL_CREAM | CUTANEOUS | 2 refills | Status: DC | PRN

## 2019-08-21 NOTE — Progress Notes (Signed)
Established Patient Office Visit  Subjective:  Patient ID: Krista [name], female    DOB: [birth year]  Age: 33 y.o. MRN: [id number]  CC:  Chief Complaint  Patient presents with  . Recurrent Miscarriage    HPI Krista [name], a very pleasant female with a medical history significant for sickle cell disease presents for 6 month follow up. She also recently experienced spontaneous abortion 1 month ago. She says that she was around [redacted] weeks pregnant. She is followed by Dr. Marylynn Pearson for this problem She is scheduled for a workup. She and her spouse are attempting to to conceive. As far as sickle cell anemia, patient does not have complaint. She says that last sickle cell pain crisis was following miscarriage. She managed crisis at home with Oxycodone and Ibuprofen. She infrequently experiences sickle cell pain crisis. She attributed previous crisis to stress. She currently denies pain. She takes folic acid consistently. She denies fever, chills, sick contacts, or exposure to COVID 19. She also denies headache, chest pain, shortness of breath, dysuria, nausea, vomiting or diarrhea.    Patient complains of rash involving bilateral hands. Krista [name] says that rash has been present over the past several months. She says that she has increased handwashing due to COVID crisis. Also, she has been using an increased amount of hand sanitizer. She has attempted OTC lotions with minimal relief. She characterizes pain as dry and itching. She denies having contacts with a similar rash.  Past Medical History:  Diagnosis Date  . Chronic pain    with sickle cell   . Eczema   . Heart murmur    07-13-2019  per pt since childhood,  has not ever had a echo done ,  asymptomatic  . History of blood transfusion    x3   . Missed ab   . Sickle cell anemia ([place])    followed by pcp---  last crisis 12-14-2018  . Vitamin D deficiency   . Wears glasses     Past Surgical History:  Procedure Laterality Date  .  LAPAROSCOPIC CHOLECYSTECTOMY  1997    Family History  Problem Relation Age of Onset  . Diabetes Father   . Hypertension Father   . Heart disease Brother   . Cancer Maternal Grandmother   . Heart disease Brother   . Breast cancer Paternal Aunt   . Cancer Paternal Uncle     Social History   Socioeconomic History  . Marital status: Married    Spouse name: n/a  . Number of children: 0  . Years of education: 16+  . Highest education level: Not on file  Occupational History  . Occupation: Product manager: [employer]  Social Needs  . Financial resource strain: Not on file  . Food insecurity    Worry: Not on file    Inability: Not on file  . Transportation needs    Medical: Not on file    Non-medical: Not on file  Tobacco Use  . Smoking status: Never Smoker  . Smokeless tobacco: Never Used  Substance and Sexual Activity  . Alcohol use: Not Currently    Comment: socially  . Drug use: Never  . Sexual activity: Yes    Birth control/protection: None  Lifestyle  . Physical activity    Days per week: Not on file    Minutes per session: Not on file  . Stress: Not on file  Relationships  . Social connections    Talks on phone: Not on  file    Gets together: Not on file    Attends religious service: Not on file    Active member of club or organization: Not on file    Attends meetings of clubs or organizations: Not on file    Relationship status: Not on file  . Intimate partner violence    Fear of current or ex partner: Not on file    Emotionally abused: Not on file    Physically abused: Not on file    Forced sexual activity: Not on file  Other Topics Concern  . Not on file  Social History Narrative   ** Merged History Encounter **       Lives alone.  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    Outpatient Medications Prior to Visit  Medication Sig Dispense Refill  . folic acid  (FOLVITE) 1 MG tablet Take 1 tablet (1 mg total) by mouth daily. 90 tablet 3  . cetirizine (ZYRTEC) 10 MG tablet Take 10 mg by mouth daily as needed.     . Cholecalciferol (VITAMIN D3) 25 MCG (1000 UT) CAPS TAKE 1 CAPSULE BY MOUTH ONCE DAILY (Patient not taking: Reported on 06/13/2019) 100 capsule 11  . ibuprofen (ADVIL) 600 MG tablet Take 1 tablet (600 mg total) by mouth every 8 (eight) hours as needed. (Patient not taking: Reported on 08/21/2019) 30 tablet 0  . ondansetron (ZOFRAN ODT) 4 MG disintegrating tablet Take 1 tablet (4 mg total) by mouth every 8 (eight) hours as needed for nausea or vomiting. (Patient not taking: Reported on 06/13/2019) 20 tablet 0  . oxycodone (OXY-IR) 5 MG capsule Take 1 capsule (5 mg total) by mouth every 4 (four) hours as needed. (Patient not taking: Reported on 08/21/2019) 20 capsule 0  . mometasone (ELOCON) 0.1 % cream Apply 1 application topically as needed.     No facility-administered medications prior to visit.     Allergies  Allergen Reactions  . Keflex [Cephalexin] Swelling    Face swelling    ROS Review of Systems  Constitutional: Negative for activity change and appetite change.  HENT: Negative.   Respiratory: Negative for apnea and chest tightness.   Cardiovascular: Negative for chest pain and leg swelling.  Endocrine: Negative for polydipsia, polyphagia and polyuria.  Genitourinary: Negative.   Musculoskeletal: Negative.   Skin: Positive for rash (bilateral hands).  Neurological: Negative.   Hematological: Negative.   Psychiatric/Behavioral: Negative.       Objective:    Physical Exam  Constitutional: She is oriented to person, place, and time. She appears well-developed and well-nourished.  HENT:  Head: Normocephalic and atraumatic.  Eyes: Pupils are equal, round, and reactive to light.  Neck: Normal range of motion.  Cardiovascular: Normal rate and regular rhythm.  Pulmonary/Chest: Effort normal and breath sounds normal.   Abdominal: Soft. Bowel sounds are normal.  Musculoskeletal: Normal range of motion.  Neurological: She is alert and oriented to person, place, and time.  Skin: Skin is warm and dry. Rash (pinpoint, dry, flat, macules to dorsal aspect of hand bilaterally) noted.  Psychiatric: She has a normal mood and affect. Her behavior is normal. Judgment and thought content normal.    BP 115/69 (BP Location: Right Arm, Patient Position: Sitting, Cuff Size: Normal)   Pulse 85   Temp 99.3 F (37.4 C) (Oral)   Resp 14   Ht 5\' 7"  (1.702 m)   Wt 163 lb (73.9 kg)  LMP 08/09/2019   SpO2 98%   BMI 25.53 kg/m  Wt Readings from Last 3 Encounters:  08/21/19 163 lb (73.9 kg)  06/13/19 162 lb (73.5 kg)  12/13/18 152 lb (68.9 kg)     Lab Results  Component Value Date   TSH 2.21 03/24/2017   Lab Results  Component Value Date   WBC 11.6 (H) 06/13/2019   HGB 8.4 (L) 06/13/2019   HCT 24.3 (L) 06/13/2019   MCV 90 06/13/2019   PLT 730 (H) 06/13/2019   Lab Results  Component Value Date   NA 141 06/13/2019   K 4.3 06/13/2019   CO2 20 06/13/2019   GLUCOSE 79 06/13/2019   BUN 5 (L) 06/13/2019   CREATININE 0.53 (L) 06/13/2019   BILITOT 2.4 (H) 06/13/2019   ALKPHOS 70 06/13/2019   AST 14 06/13/2019   ALT 12 06/13/2019   PROT 6.9 06/13/2019   ALBUMIN 4.7 06/13/2019   CALCIUM 9.3 06/13/2019   ANIONGAP 9 12/14/2018   Lab Results  Component Value Date   CHOL 112 (L) 08/12/2015   Lab Results  Component Value Date   HDL 26 (L) 08/12/2015   Lab Results  Component Value Date   LDLCALC 67 08/12/2015   Lab Results  Component Value Date   TRIG 95 08/12/2015   Lab Results  Component Value Date   CHOLHDL 4.3 08/12/2015   Lab Results  Component Value Date   HGBA1C <4.0 03/24/2017      Assessment & Plan:   Problem List Items Addressed This Visit      Other   Sickle cell anemia (HCC) (Chronic)   Relevant Orders   CBC with Differential   Comprehensive metabolic panel    Reticulocytes   Ambulatory referral to Ophthalmology    Other Visit Diagnoses    Influenza vaccination given    -  Primary   Relevant Orders   Flu Vaccine QUAD 36+ mos IM (Fluarix & Fluzone Quad PF   Vitamin D deficiency       Relevant Orders   Vitamin D, 25-hydroxy   Rash and nonspecific skin eruption       Relevant Medications   mometasone (ELOCON) 0.1 % cream      Meds ordered this encounter  Medications  . mometasone (ELOCON) 0.1 % cream    Sig: Apply 1 application topically as needed.    Dispense:  45 g    Refill:  2    Order Specific Question:   Supervising Provider    Answer:   Angelica Chessman E [id number]. Influenza vaccination given  Flu Vaccine QUAD 36+ mos IM (Fluarix & Fluzone Quad PF  2. Hb-SS disease with crisis ([place]) Continue folic acid 1 mg daily to prevent aplastic bone marrow crises.   Pulmonary evaluation - Patient denies severe recurrent wheezes, shortness of breath with exercise, or persistent cough. If these symptoms develop, pulmonary function tests with spirometry will be ordered, and if abnormal, plan on referral to Pulmonology for further evaluation.  Cardiac - Routine screening for pulmonary hypertension is not recommended.  Eye - High risk of proliferative retinopathy. Annual eye exam with retinal exam recommended to patient.  Immunization status - Influenza vaccination received   wed details related to analgesia, adverse effects, aberrant behaviors.  - CBC with Differential - Comprehensive metabolic panel - Reticulocytes - Ambulatory referral to Ophthalmology  3. Vitamin D deficiency - Vitamin D, 25-hydroxy  4. Rash and nonspecific skin eruption Will start a trial of mometasone, apply  to bilateral hands as needed.  - mometasone (ELOCON) 0.1 % cream; Apply 1 application topically as needed.  Dispense: 45 g; Refill: 2    Follow-up: Return in about 6 months (around 02/18/2020) for sickle cell anemia.    [name]  APRN,  MSN, FNP-C [facility] [address] [place], [place] [address] [phone]   This note was prepared using Dragon speech recognition software, errors in dictation are unintentional.

## 2019-08-22 LAB — COMPREHENSIVE METABOLIC PANEL
ALT: 14 IU/L (ref 0–32)
AST: 18 IU/L (ref 0–40)
Albumin/Globulin Ratio: 2.1 (ref 1.2–2.2)
Albumin: 4.8 g/dL (ref 3.8–4.8)
Alkaline Phosphatase: 88 IU/L (ref 39–117)
BUN/Creatinine Ratio: 11 (ref 9–23)
BUN: 5 mg/dL — ABNORMAL LOW (ref 6–20)
Bilirubin Total: 3.3 mg/dL — ABNORMAL HIGH (ref 0.0–1.2)
CO2: 21 mmol/L (ref 20–29)
Calcium: 9.8 mg/dL (ref 8.7–10.2)
Chloride: 102 mmol/L (ref 96–106)
Creatinine, Ser: 0.47 mg/dL — ABNORMAL LOW (ref 0.57–1.00)
GFR calc Af Amer: 151 mL/min/{1.73_m2} (ref >59)
GFR calc non Af Amer: 131 mL/min/{1.73_m2} (ref >59)
Globulin, Total: 2.3 g/dL (ref 1.5–4.5)
Glucose: 87 mg/dL (ref 65–99)
Potassium: 4.2 mmol/L (ref 3.5–5.2)
Sodium: 139 mmol/L (ref 134–144)
Total Protein: 7.1 g/dL (ref 6.0–8.5)

## 2019-08-22 LAB — CBC WITH DIFFERENTIAL/PLATELET
Basophils Absolute: 0.1 10*3/uL (ref 0.0–0.2)
Basos: 0 %
EOS (ABSOLUTE): 0.1 10*3/uL (ref 0.0–0.4)
Eos: 0 %
Hematocrit: 25.8 % — ABNORMAL LOW (ref 34.0–46.6)
Hemoglobin: 8.3 g/dL — ABNORMAL LOW (ref 11.1–15.9)
Immature Grans (Abs): 0.1 10*3/uL (ref 0.0–0.1)
Immature Granulocytes: 1 %
Lymphocytes Absolute: 3 10*3/uL (ref 0.7–3.1)
Lymphs: 22 %
MCH: 30 pg (ref 26.6–33.0)
MCHC: 32.2 g/dL (ref 31.5–35.7)
MCV: 93 fL (ref 79–97)
Monocytes Absolute: 1 10*3/uL — ABNORMAL HIGH (ref 0.1–0.9)
Monocytes: 7 %
NRBC: 3 % — ABNORMAL HIGH (ref 0–0)
Neutrophils Absolute: 9.7 10*3/uL — ABNORMAL HIGH (ref 1.4–7.0)
Neutrophils: 70 %
Platelets: 746 10*3/uL — ABNORMAL HIGH (ref 150–450)
RBC: 2.77 x10E6/uL — ABNORMAL LOW (ref 3.77–5.28)
RDW: 15.4 % (ref 11.7–15.4)
WBC: 13.9 10*3/uL — ABNORMAL HIGH (ref 3.4–10.8)

## 2019-08-22 LAB — VITAMIN D 25 HYDROXY (VIT D DEFICIENCY, FRACTURES): Vit D, 25-Hydroxy: 17.8 ng/mL — ABNORMAL LOW (ref 30.0–100.0)

## 2019-08-22 LAB — RETICULOCYTES: Retic Ct Pct: 17.5 % — ABNORMAL HIGH (ref 0.6–2.6)

## 2019-08-28 ENCOUNTER — Other Ambulatory Visit: Payer: Self-pay | Admitting: Family Medicine

## 2019-08-28 ENCOUNTER — Telehealth: Payer: Self-pay

## 2019-08-28 DIAGNOSIS — E559 Vitamin D deficiency, unspecified: Secondary | ICD-10-CM

## 2019-08-28 NOTE — Telephone Encounter (Signed)
-----   Message from Dorena Dew, [place] sent at 08/28/2019  2:45 PM EST ----- Regarding: lab results Please inform patient that her platelets are elevated, which is consistent with previous result. Please continue daily Aspirin 81 mg. White blood cell count is mildly elevated, there were no signs of infection or inflammation during appointment. Suspect that WBCs are reactive due to sickle cell anemia. Hemoglobin is consistent with patient's baseline. Please schedule a lab appointment in 1 month to assess platelet count. Otherwise, follow up in clinic as scheduled.    [name]  APRN, MSN, FNP-C [facility] [address] [place], [place] [address] [phone]

## 2019-08-28 NOTE — Telephone Encounter (Signed)
Called and spoke with patient, advised tat platelets are elevated and that it is consistent with previous result. Advised that WBC is mildly elevated and at appointment she showed no signs of infection or inflammation so this is suspected to be due to sickle cell anemia. Also advised that Hemoglobin was consistent with baseline. Advised to continue to take aspirin 81mg  daily and to come back in 1 month for repeat CBC. This appointment has been scheduled. Thanks!

## 2019-09-18 NOTE — Progress Notes (Signed)
[facility] Note  09/19/2019     CHIEF COMPLAINT Patient presents for Retina Evaluation   HISTORY OF PRESENT ILLNESS: Krista [name] is a 33 y.o. female who presents to the clinic today for:   HPI    Retina Evaluation    In left eye.  This started 2 weeks ago.  Duration of 2 weeks.  Associated Symptoms Distortion.  Context:  distance vision, mid-range vision and near vision.  Treatments tried include eye drops.  Response to treatment was no improvement.  I, the attending physician,  performed the HPI with the patient and updated documentation appropriately.          Comments    33 y/o female pt referred by Dr. Parke Simmers for eval of mac edema OS.  Saw Dr. Parke Simmers on 11.18.20.  VA good OD; blurred OS cc.  Denies pain, flashes, floaters.  Similisan eye relief gtts prn OU.       Last edited by Bernarda Caffey, MD on 09/19/2019  8:39 AM. 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 Parke Simmers, she states DeMarco told her he could see scarring on her retina that could be from the vasculitis, pt states she has no medical problems other than sickle cell, she denies family hx of eye problems or autoimmune disease  Referring physician: DeMarco, [place] OD [place],  [id number]   HISTORICAL INFORMATION:   Selected notes from the [place] Referral from Dr. [place] DeMarco for macular edema OS  LEE: 11.18.20, BCVA OD: 20/20 OS: 20/30+2  PMH: sickle cell disease, chronic pain, heart murmer, hx of retinal vasculitits    CURRENT MEDICATIONS: No current  outpatient medications on file. (Ophthalmic Drugs)   No current facility-administered medications for this visit.  (Ophthalmic Drugs)   Current Outpatient Medications (Other)  Medication Sig  . cetirizine (ZYRTEC) 10 MG tablet Take 10 mg by mouth daily as needed.   . Cholecalciferol (VITAMIN D3) 25 MCG (1000 UT) CAPS TAKE 1 CAPSULE BY MOUTH ONCE DAILY  . famotidine (PEPCID) 40 MG tablet famotidine 40 mg tablet  . folic acid (FOLVITE) 1 MG tablet Take 1 tablet (1 mg total) by mouth daily.  Marland Kitchen ibuprofen (ADVIL) 600 MG tablet Take 1 tablet (600 mg total) by mouth every 8 (eight) hours as needed.  . mometasone (ELOCON) 0.1 % cream Apply 1 application topically as needed.  . ondansetron (ZOFRAN ODT) 4 MG disintegrating tablet Take 1 tablet (4 mg total) by mouth every 8 (eight) hours as needed for nausea or vomiting.  Marland Kitchen oxycodone (OXY-IR) 5 MG capsule Take 1 capsule (5 mg total) by mouth every 4 (four) hours as needed.   No current facility-administered medications for this visit.  (Other)      REVIEW OF SYSTEMS: ROS    Positive for: Gastrointestinal, Skin, Cardiovascular, Eyes   Negative for: Constitutional, Neurological, Genitourinary, Musculoskeletal, HENT, Endocrine, Respiratory, Psychiatric, Allergic/Imm, Heme/Lymph   Last edited by Matthew Folks, COA on 09/19/2019  8:28 AM. (History)  ALLERGIES Allergies  Allergen Reactions  . Keflex [Cephalexin] Swelling    Face swelling    PAST MEDICAL HISTORY Past Medical History:  Diagnosis Date  . Chronic pain    with sickle cell   . Eczema   . Heart murmur    07-13-2019  per pt since childhood,  has not ever had a echo done ,  asymptomatic  . History of blood transfusion    x3   . Missed ab   . Sickle cell anemia ([place])    followed by pcp---  last crisis 12-14-2018  . Vitamin D deficiency   . Wears glasses    Past Surgical History:  Procedure Laterality Date  . LAPAROSCOPIC CHOLECYSTECTOMY  1997    FAMILY  HISTORY Family History  Problem Relation Age of Onset  . Diabetes Father   . Hypertension Father   . Heart disease Brother   . Cancer Maternal Grandmother   . Heart disease Brother   . Breast cancer Paternal Aunt   . Cancer Paternal Uncle     SOCIAL HISTORY Social History   Tobacco Use  . Smoking status: Never Smoker  . Smokeless tobacco: Never Used  Substance Use Topics  . Alcohol use: Not Currently    Comment: socially  . Drug use: Never         OPHTHALMIC EXAM:  Base Eye Exam    Visual Acuity (Snellen - Linear)      Right Left   Dist cc 20/20 - 20/30 -2   Dist ph cc  NI   Correction: Glasses       Tonometry (Tonopen, 8:31 AM)      Right Left   Pressure 16 14       Pupils      Dark Light Shape React APD   Right 4 3 Round Brisk None   Left 4 3 Round Brisk None       Visual Fields (Counting fingers)      Left Right     Full   Restrictions Partial outer superior temporal, inferior temporal deficiencies        Extraocular Movement      Right Left    Full, Ortho Full, Ortho       Neuro/Psych    Oriented x3: Yes   Mood/Affect: Normal       Dilation    Both eyes: 1.0% Mydriacyl, 2.5% Phenylephrine @ 8:31 AM        Slit Lamp and Fundus Exam    Slit Lamp Exam      Right Left   Lids/Lashes Normal Normal   Conjunctiva/Sclera Mild Melanosis Mild Melanosis   Cornea Clear, mild tear film debris Clear, mild tear film debris   Anterior Chamber Deep and quiet Deep and quiet   Iris Round and dilated Round and dilated   Lens Clear Clear   Vitreous Normal +Cell, mild Vitreous syneresis       Fundus Exam      Right Left   Disc Pink and Sharp Pink and Sharp, 270 degrees of PPP   C/D Ratio 0.3 0.1   Macula Flat, Blunted foveal reflex, No heme or edema Blunted foveal reflex, +Cystic changes centrally, mild Epiretinal membrane   Vessels Vascular attenuation, Tortuous, mild Copper wiring, AV crossing changes Vascular attenuation, Copper wiring    Periphery Attached    Attached, focal CR scar at 0730 equator           Refraction    Wearing Rx  Sphere Cylinder Axis   Right -1.50 +1.25 005   Left -1.50 +0.75 006   Age: 54yr   Type: SVL       Manifest Refraction      Sphere Cylinder Axis Dist VA   Right -2.00 +1.50 005 20/20   Left -2.75 +1.25 005 20/30          IMAGING AND PROCEDURES  Imaging and Procedures for _0 @  OCT, Retina - OU - Both Eyes       Right Eye Quality was good. Central Foveal Thickness: 220. Progression has no prior data. Findings include normal foveal contour, no IRF, no SRF (Broad foveal depression).   Left Eye Quality was good. Central Foveal Thickness: 377. Progression has no prior data. Findings include abnormal foveal contour, intraretinal fluid, no SRF, epiretinal membrane, vitreomacular adhesion , outer retinal atrophy (Diffuse peripheral ellipsoid loss with central sparing; +central CME/IRF).   Notes *Images captured and stored on drive  Diagnosis / Impression:  OD: NFP, no IRF/SRF OS: central CME; diffuse peripheral ellipsoid thinning with central sparing  Clinical management:  See below  Abbreviations: NFP - Normal foveal profile. CME - cystoid macular edema. PED - pigment epithelial detachment. IRF - intraretinal fluid. SRF - subretinal fluid. EZ - ellipsoid zone. ERM - epiretinal membrane. ORA - outer retinal atrophy. ORT - outer retinal tubulation. SRHM - subretinal hyper-reflective material        Fluorescein Angiography Optos (Transit OS)       Right Eye   Progression has no prior data. Early phase findings include normal observations. Mid/Late phase findings include normal observations.   Left Eye   Progression has no prior data. Early phase findings include window defect, staining. Mid/Late phase findings include staining, window defect, leakage (Late perivascular leakage noted superior and inferior peripheral midzone -- mild phlebitis).   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 Retinal edema  H35.81 OCT, Retina - OU - Both Eyes  4. Hypertensive retinopathy of both eyes  H35.033 Fluorescein Angiography Optos (Transit OS)    1,2. Peripheral chorioretinal inflammation and vasculitis OS  - pt reports vague history "vasculitis" OS -- first diagnosed in ?[place] in 2016 -- doesn't think she saw a retina specialist  - states in 2016 had an episode of seeing spots then vision loss OS, told she had "vasculitis"  - +vitreous cell  - exam shows peripapillary atrophy and pigment deposition  - OCT with diffuse peripheral ellipsoid loss with central sparing + central CME  - FA 12.2.20 shows mild, late perivascular leakage, peripheral midzone OS (very late appearing--likely increased signal if study were allowed to continue beyond 10 min); OD normal study  - BCVA 20/30 OS, but 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 SManuella Ghazi Retina/Uveitis specialist, for evaluation and management  - appt scheduled for 12.11.2020  - f/u here prn per Dr. SManuella Ghazi 3. Retinal edema  - OCT shows central CME/IRF, but FA has no corresponding central hyperfluorescent leakage/staining--petaloid or otherwise  4. History of Sickle Cell  SS disease  - no SS retinopathy  Ophthalmic Meds Ordered this visit:  No orders of the defined types were placed in this encounter.      Return if symptoms worsen or fail to improve.  There are no Patient Instructions on file for this visit.   Explained the diagnoses, plan, and follow up with the patient and they  expressed understanding.  Patient expressed understanding of the importance of proper follow up care.   This document serves as a record of services personally performed by Gardiner Sleeper, MD, PhD. It was created on their behalf by Roselee Nova, COMT. The creation of this record is the provider's dictation and/or activities during the visit.  Electronically signed by: Roselee Nova, COMT 09/19/19 8:42 PM   This document serves as a record of services personally performed by Gardiner Sleeper, MD, PhD. It was created on their behalf by Ernest Mallick, OA, an ophthalmic assistant. The creation of this record is the provider's dictation and/or activities during the visit.    Electronically signed by: Ernest Mallick, OA 12.02.2020 8:42 PM   Gardiner Sleeper, M.D., Ph.D. Diseases & Surgery of the Retina and Vitreous [facility]  I have reviewed the above documentation for accuracy and completeness, and I agree with the above. Gardiner Sleeper, M.D., Ph.D. 09/19/19 8:42 PM   Abbreviations: M myopia (nearsighted); A astigmatism; H hyperopia (farsighted); P presbyopia; Mrx spectacle prescription;  CTL contact lenses; OD right eye; OS left eye; OU both eyes  XT exotropia; ET esotropia; PEK punctate epithelial keratitis; PEE punctate epithelial erosions; DES dry eye syndrome; MGD meibomian gland dysfunction; ATs artificial tears; PFAT's preservative free artificial tears; Platea nuclear sclerotic cataract; PSC posterior subcapsular cataract; ERM epi-retinal membrane; PVD posterior vitreous detachment; RD retinal detachment; DM diabetes mellitus; DR diabetic retinopathy; NPDR non-proliferative diabetic retinopathy; PDR proliferative diabetic retinopathy; CSME clinically significant macular edema; DME diabetic macular edema; dbh dot blot hemorrhages; CWS cotton wool spot; POAG primary open angle glaucoma; C/D cup-to-disc ratio; HVF humphrey visual field; GVF goldmann visual field; OCT optical  coherence tomography; IOP intraocular pressure; BRVO Branch retinal vein occlusion; CRVO central retinal vein occlusion; CRAO central retinal artery occlusion; BRAO branch retinal artery occlusion; RT retinal tear; SB scleral buckle; PPV pars plana vitrectomy; VH Vitreous hemorrhage; PRP panretinal laser photocoagulation; IVK intravitreal kenalog; VMT vitreomacular traction; MH Macular hole;  NVD neovascularization of the disc; NVE neovascularization elsewhere; AREDS age related eye disease study; ARMD age related macular degeneration; POAG primary open angle glaucoma; EBMD epithelial/anterior basement membrane dystrophy; ACIOL anterior chamber intraocular lens; IOL intraocular lens; PCIOL posterior chamber intraocular lens; Phaco/IOL phacoemulsification with intraocular lens placement; So-Hi photorefractive keratectomy; LASIK laser assisted in situ keratomileusis; HTN hypertension; DM diabetes mellitus; COPD chronic obstructive pulmonary disease

## 2019-09-19 ENCOUNTER — Encounter (INDEPENDENT_AMBULATORY_CARE_PROVIDER_SITE_OTHER): Payer: Self-pay | Admitting: Ophthalmology

## 2019-09-19 ENCOUNTER — Ambulatory Visit (INDEPENDENT_AMBULATORY_CARE_PROVIDER_SITE_OTHER): Payer: BC Managed Care – PPO | Admitting: Ophthalmology

## 2019-09-19 DIAGNOSIS — H3581 Retinal edema: Secondary | ICD-10-CM

## 2019-09-19 DIAGNOSIS — H35062 Retinal vasculitis, left eye: Secondary | ICD-10-CM | POA: Diagnosis not present

## 2019-09-19 DIAGNOSIS — H30032 Focal chorioretinal inflammation, peripheral, left eye: Secondary | ICD-10-CM | POA: Diagnosis not present

## 2019-09-19 DIAGNOSIS — H35033 Hypertensive retinopathy, bilateral: Secondary | ICD-10-CM

## 2019-09-20 NOTE — Addendum Note (Signed)
Addended by: Gardiner Sleeper on: 09/20/2019 08:25 AM   Modules accepted: Level of Service

## 2019-09-25 ENCOUNTER — Other Ambulatory Visit: Payer: Self-pay

## 2019-09-25 ENCOUNTER — Other Ambulatory Visit: Payer: BC Managed Care – PPO

## 2019-09-25 DIAGNOSIS — D571 Sickle-cell disease without crisis: Secondary | ICD-10-CM

## 2019-09-26 LAB — CBC WITH DIFFERENTIAL/PLATELET
Basophils Absolute: 0.1 10*3/uL (ref 0.0–0.2)
Basos: 1 %
EOS (ABSOLUTE): 0.1 10*3/uL (ref 0.0–0.4)
Eos: 0 %
Hematocrit: 24.7 % — ABNORMAL LOW (ref 34.0–46.6)
Hemoglobin: 8.4 g/dL — ABNORMAL LOW (ref 11.1–15.9)
Immature Grans (Abs): 0.1 10*3/uL (ref 0.0–0.1)
Immature Granulocytes: 1 %
Lymphocytes Absolute: 3.8 10*3/uL — ABNORMAL HIGH (ref 0.7–3.1)
Lymphs: 24 %
MCH: 30.8 pg (ref 26.6–33.0)
MCHC: 34 g/dL (ref 31.5–35.7)
MCV: 91 fL (ref 79–97)
Monocytes Absolute: 1.1 10*3/uL — ABNORMAL HIGH (ref 0.1–0.9)
Monocytes: 7 %
NRBC: 4 % — ABNORMAL HIGH (ref 0–0)
Neutrophils Absolute: 10.7 10*3/uL — ABNORMAL HIGH (ref 1.4–7.0)
Neutrophils: 67 %
Platelets: 735 10*3/uL — ABNORMAL HIGH (ref 150–450)
RBC: 2.73 x10E6/uL — CL (ref 3.77–5.28)
RDW: 16.1 % — ABNORMAL HIGH (ref 11.7–15.4)
WBC: 15.9 10*3/uL — ABNORMAL HIGH (ref 3.4–10.8)

## 2019-11-02 NOTE — Progress Notes (Signed)
[facility] Note  11/12/2019     CHIEF COMPLAINT Patient presents for Retina Follow Up   HISTORY OF PRESENT ILLNESS: Krista [name] is a 34 y.o. female who presents to the clinic today for:   HPI    Retina Follow Up    Patient presents with  Other.  In left eye.  This started 7.5 weeks ago.  Severity is moderate.  Duration of 7.5 weeks.  Since onset it is stable.  I, the attending physician,  performed the HPI with the patient and updated documentation appropriately.          Comments    34 y/o female pt here for 7.5 wk f/u for peripheral focal chorioretinal inflammation OS.  No change in [place] OU.  Denies pain, flashes, floaters, but eyes feel dry and irritated.  Saw Dr. Manuella Ghazi on 12.11.20, who recommended laser over injections OS.  No gtts.       Last edited by Bernarda Caffey, MD on 11/12/2019 11:22 AM. (History)    Pt saw Dr. Manuella Ghazi and states he has recommendations for injections and or laser OS.  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    Referring physician: [place], [place] OD [place], [place] [id number]   HISTORICAL INFORMATION:   Selected notes from the MEDICAL RECORD NUMBER Referral from Dr. [place] DeMarco for macular edema OS  LEE: 11.18.20, BCVA OD: 20/20 OS: 20/30+2  PMH: sickle cell disease, chronic pain, heart murmer, hx of retinal vasculitits    CURRENT MEDICATIONS: Current Outpatient Medications (Ophthalmic Drugs)  Medication Sig  . prednisoLONE acetate (PRED FORTE) 1 % ophthalmic suspension Place 1 drop into the left eye 4 (four) times daily for 7 days.   No current facility-administered medications for this visit. (Ophthalmic Drugs)   Current Outpatient Medications (Other)  Medication Sig  . aspirin 81 MG EC tablet aspirin 81 mg tablet,delayed release  . Cholecalciferol (VITAMIN D3) 25 MCG  (1000 UT) CAPS TAKE 1 CAPSULE BY MOUTH ONCE DAILY  . enoxaparin (LOVENOX) 40 MG/0.4ML injection enoxaparin 40 mg/0.4 mL subcutaneous syringe  . folic acid (FOLVITE) 1 MG tablet Take 1 tablet (1 mg total) by mouth daily.  Marland Kitchen ibuprofen (ADVIL) 600 MG tablet Take 1 tablet (600 mg total) by mouth every 8 (eight) hours as needed.  . mometasone (ELOCON) 0.1 % cream Apply 1 application topically as needed.  Marland Kitchen oxycodone (OXY-IR) 5 MG capsule Take 1 capsule (5 mg total) by mouth every 4 (four) hours as needed.  . progesterone (PROMETRIUM) 200 MG capsule progesterone micronized 200 mg capsule  INSERT 2 CAPSULES VAGINALLY TWICE DAILY  . cetirizine (ZYRTEC) 10 MG tablet Take 10 mg by mouth daily as needed.   . famotidine (PEPCID) 40 MG tablet famotidine 40 mg tablet  . ondansetron (ZOFRAN ODT) 4 MG disintegrating tablet Take 1 tablet (4 mg total) by mouth every 8 (eight) hours as needed for nausea or vomiting.   No current facility-administered medications for this visit. (Other)      REVIEW OF SYSTEMS: ROS    Positive for: Gastrointestinal, Cardiovascular, Eyes   Negative for: Constitutional, Neurological, Skin, Genitourinary, Musculoskeletal, HENT, Endocrine, Respiratory, Psychiatric, Allergic/Imm, Heme/Lymph   Last edited by Matthew Folks, COA on 11/12/2019 10:11 AM. (History)       ALLERGIES Allergies  Allergen Reactions  . Keflex [Cephalexin] Swelling  Face swelling    PAST MEDICAL HISTORY Past Medical History:  Diagnosis Date  . Chronic pain    with sickle cell   . Eczema   . Heart murmur    07-13-2019  per pt since childhood,  has not ever had a echo done ,  asymptomatic  . History of blood transfusion    x3   . Missed ab   . Sickle cell anemia ([place])    followed by pcp---  last crisis 12-14-2018  . Vitamin D deficiency   . Wears glasses    Past Surgical History:  Procedure Laterality Date  . LAPAROSCOPIC CHOLECYSTECTOMY  1997    FAMILY HISTORY Family History   Problem Relation Age of Onset  . Diabetes Father   . Hypertension Father   . Heart disease Brother   . Cancer Maternal Grandmother   . Heart disease Brother   . Breast cancer Paternal Aunt   . Cancer Paternal Uncle     SOCIAL HISTORY Social History   Tobacco Use  . Smoking status: Never Smoker  . Smokeless tobacco: Never Used  Substance Use Topics  . Alcohol use: Not Currently    Comment: socially  . Drug use: Never         OPHTHALMIC EXAM:  Base Eye Exam    Visual Acuity (Snellen - Linear)      Right Left   Dist cc 20/20 20/30 -2   Correction: Glasses       Tonometry (Tonopen, 10:14 AM)      Right Left   Pressure 14 12       Pupils      Dark Light Shape React APD   Right 4 3 Round Brisk None   Left 4 3 Round Brisk None       Visual Fields (Counting fingers)      Left Right     Full   Restrictions Partial outer superior temporal, inferior temporal deficiencies        Neuro/Psych    Oriented x3: Yes   Mood/Affect: Normal       Dilation    Both eyes: 1.0% Mydriacyl, 2.5% Phenylephrine @ 10:14 AM        Slit Lamp and Fundus Exam    Slit Lamp Exam      Right Left   Lids/Lashes Normal Normal   Conjunctiva/Sclera Mild Melanosis Mild Melanosis   Cornea Clear, mild tear film debris Clear, mild tear film debris   Anterior Chamber Deep and quiet, no cell or flare Deep and quiet, no cell or flare   Iris Round and dilated Round and dilated   Lens Clear Clear   Vitreous Normal +Cell, mild Vitreous syneresis       Fundus Exam      Right Left   Disc Pink and Sharp, mild PPA Pink and Sharp, 270 degrees of PPP   C/D Ratio 0.3 0.1   Macula Flat, Blunted foveal reflex, No heme or edema Blunted foveal reflex, +Cystic changes centrally--slightly improved, mild Epiretinal membrane, RPE mottling and clumping   Vessels Vascular attenuation, Tortuous, mild Copper wiring, AV crossing changes Vascular attenuation, Copper wiring, Tortuous   Periphery Attached, no  heme    Attached, focal CR scar at 0730 equator             IMAGING AND PROCEDURES  Imaging and Procedures for @TODAY @  OCT, Retina - OU - Both Eyes       Right Eye Quality was good. Central Foveal  Thickness: 213. Progression has been stable. Findings include normal foveal contour, no IRF, no SRF (Broad foveal depression).   Left Eye Quality was good. Central Foveal Thickness: 230. Progression has improved. Findings include intraretinal fluid, epiretinal membrane, vitreomacular adhesion , outer retinal atrophy, normal foveal contour (Interval improvement in IRF and foveal profile).   Notes *Images captured and stored on drive  Diagnosis / Impression: OD: NFP, no IRF/SRF OS: Interval improvement in IRF and foveal profile   See below  Abbreviations: NFP - Normal foveal profile. CME - cystoid macular edema. PED - pigment epithelial detachment. IRF - intraretinal fluid. SRF - subretinal fluid. EZ - ellipsoid zone. ERM - epiretinal membrane. ORA - outer retinal atrophy. ORT - outer retinal tubulation. SRHM - subretinal hyper-reflective material        Panretinal Photocoagulation - OS - Left Eye       LASER PROCEDURE NOTE  Diagnosis:   Peripheral focal chorioretinal inflammation, LEFT EYE                         Retinal Vasculitis, LEFT EYE                         History of Sickle Cell Disease  Procedure:  Pan-retinal photocoagulation using slit lamp laser, LEFT EYE  Anesthesia:  Topical  Surgeon: Bernarda Caffey, MD, PhD   Informed consent obtained, operative eye marked, and time out performed prior to initiation of laser.   [place] AY:[id number] slit lamp laser Pattern: 3x3 square Power: 220-230 mW Duration: 30 msec  Spot size: 200 microns  # spots: [id number] spots [id number]  Complications: None.  RTC: 4-6 wks  Patient tolerated the procedure well and received written and verbal post-procedure care information/education.                  ASSESSMENT/PLAN:     ICD-10-CM   1. 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 Retinal vasculitis of left eye  H35.062 Panretinal Photocoagulation - OS - Left Eye  3. Retinal edema  H35.81 OCT, Retina - OU - Both Eyes  4. Hypertensive retinopathy of both eyes  H35.033     1,2. Peripheral chorioretinal inflammation and vasculitis OS  - pt reports vague history "vasculitis" OS -- first diagnosed in ?[place] in 2016 -- doesn't think she saw a retina specialist  - states in 2016 had an episode of seeing spots then vision loss OS, told she had "vasculitis"  - +vitreous cell  - exam shows peripapillary atrophy and pigment deposition  - OCT with diffuse peripheral ellipsoid loss with central sparing + central CME -- slightly improved today  - FA 12.2.20 shows mild, late perivascular leakage, peripheral midzone OS (very late appearing--likely increased signal if study were allowed to continue beyond 10 min); OD normal study  - BCVA 20/30 OS, but 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 Matilde Bash, Uveitis/Retina expert, who initiated extensive work up and advised laser PRP  - of note, pt is pregnant  - discussed today's findings with patient             - Recommend PRP OS today, 01.25.21  - RBA of procedure discussed, questions answered  - informed consent obtained and signed  - see procedure note  - start PF QID OS x7 days  -  f/u here 4-6 wks for post laser check  - has f/u appt with Dr. Manuella Ghazi on January 08, 2020  3. Retinal edema  - OCT shows interval improvement in central CME/IRF, but FA has no corresponding central hyperfluorescent leakage/staining--petaloid or otherwise  4. History of Sickle Cell SS disease  - no SS retinopathy  Ophthalmic Meds Ordered this visit:  Meds ordered this encounter  Medications  . prednisoLONE acetate (PRED FORTE) 1 % ophthalmic suspension    Sig:  Place 1 drop into the left eye 4 (four) times daily for 7 days.    Dispense:  10 mL    Refill:  0       Return in about 4 weeks (around 12/10/2019) for Dilated exam/OCT, S/P PRP OS.  There are no Patient Instructions on file for this visit.   Explained the diagnoses, plan, and follow up with the patient and they expressed understanding.  Patient expressed understanding of the importance of proper follow up care.   This document serves as a record of services personally performed by Gardiner Sleeper, MD, PhD. It was created on their behalf by Leeann Must, [place], a certified ophthalmic assistant. The creation of this record is the provider's dictation and/or activities during the visit.    Electronically signed by: Leeann Must, COA @TODAY @ 075-GRM PM  Gardiner Sleeper, M.D., Ph.D. Diseases & Surgery of the Retina and [place]  I have reviewed the above documentation for accuracy and completeness, and I agree with the above. Gardiner Sleeper, M.D., Ph.D. 11/12/19 1:32 PM   Abbreviations: M myopia (nearsighted); A astigmatism; H hyperopia (farsighted); P presbyopia; Mrx spectacle prescription;  CTL contact lenses; OD right eye; OS left eye; OU both eyes  XT exotropia; ET esotropia; PEK punctate epithelial keratitis; PEE punctate epithelial erosions; DES dry eye syndrome; MGD meibomian gland dysfunction; ATs artificial tears; PFAT's preservative free artificial tears; [place] nuclear sclerotic cataract; PSC posterior subcapsular cataract; ERM epi-retinal membrane; PVD posterior vitreous detachment; RD retinal detachment; DM diabetes mellitus; DR diabetic retinopathy; NPDR non-proliferative diabetic retinopathy; PDR proliferative diabetic retinopathy; CSME clinically significant macular edema; DME diabetic macular edema; dbh dot blot hemorrhages; CWS cotton wool spot; POAG primary open angle glaucoma; C/D cup-to-disc ratio; HVF humphrey visual field; GVF goldmann  visual field; OCT optical coherence tomography; IOP intraocular pressure; BRVO Branch retinal vein occlusion; CRVO central retinal vein occlusion; CRAO central retinal artery occlusion; BRAO branch retinal artery occlusion; RT retinal tear; SB scleral buckle; PPV pars plana vitrectomy; VH Vitreous hemorrhage; PRP panretinal laser photocoagulation; IVK intravitreal kenalog; VMT vitreomacular traction; MH Macular hole;  NVD neovascularization of the disc; NVE neovascularization elsewhere; AREDS age related eye disease study; ARMD age related macular degeneration; POAG primary open angle glaucoma; EBMD epithelial/anterior basement membrane dystrophy; ACIOL anterior chamber intraocular lens; IOL intraocular lens; PCIOL posterior chamber intraocular lens; Phaco/IOL phacoemulsification with intraocular lens placement; [place] photorefractive keratectomy; LASIK laser assisted in situ keratomileusis; HTN hypertension; DM diabetes mellitus; COPD chronic obstructive pulmonary disease

## 2019-11-12 ENCOUNTER — Encounter (INDEPENDENT_AMBULATORY_CARE_PROVIDER_SITE_OTHER): Payer: Self-pay | Admitting: Ophthalmology

## 2019-11-12 ENCOUNTER — Other Ambulatory Visit: Payer: Self-pay

## 2019-11-12 ENCOUNTER — Ambulatory Visit (INDEPENDENT_AMBULATORY_CARE_PROVIDER_SITE_OTHER): Payer: BC Managed Care – PPO | Admitting: Ophthalmology

## 2019-11-12 DIAGNOSIS — H35033 Hypertensive retinopathy, bilateral: Secondary | ICD-10-CM | POA: Diagnosis not present

## 2019-11-12 DIAGNOSIS — H3581 Retinal edema: Secondary | ICD-10-CM

## 2019-11-12 DIAGNOSIS — H30032 Focal chorioretinal inflammation, peripheral, left eye: Secondary | ICD-10-CM

## 2019-11-12 DIAGNOSIS — H35062 Retinal vasculitis, left eye: Secondary | ICD-10-CM

## 2019-11-12 HISTORY — PX: EYE SURGERY: SHX253

## 2019-11-12 MED ORDER — PREDNISOLONE ACETATE 1 % OP SUSP: 1.0000 [drp] | Freq: Four times a day (QID) | OPHTHALMIC | 0 refills | Status: DC

## 2019-11-19 ENCOUNTER — Encounter (INDEPENDENT_AMBULATORY_CARE_PROVIDER_SITE_OTHER): Payer: Self-pay | Admitting: Ophthalmology

## 2019-11-19 ENCOUNTER — Ambulatory Visit (INDEPENDENT_AMBULATORY_CARE_PROVIDER_SITE_OTHER): Payer: BC Managed Care – PPO | Admitting: Ophthalmology

## 2019-11-19 DIAGNOSIS — H35033 Hypertensive retinopathy, bilateral: Secondary | ICD-10-CM

## 2019-11-19 DIAGNOSIS — H30032 Focal chorioretinal inflammation, peripheral, left eye: Secondary | ICD-10-CM

## 2019-11-19 DIAGNOSIS — H3581 Retinal edema: Secondary | ICD-10-CM

## 2019-11-19 DIAGNOSIS — H35062 Retinal vasculitis, left eye: Secondary | ICD-10-CM

## 2019-11-19 NOTE — Progress Notes (Signed)
[facility] Note  11/19/2019     CHIEF COMPLAINT Patient presents for Retina Follow Up   HISTORY OF PRESENT ILLNESS: Krista [name] is a 34 y.o. female who presents to the clinic today for:   HPI    Retina Follow Up    In both eyes.  This started 1 month ago.  Since onset it is gradually worsening.  I, the attending physician,  performed the HPI with the patient and updated documentation appropriately.          Comments    F/U Peripheral focal chorioretinal inflammation OS. Patient states her vision os has become blurrier in the past week and she can't see peripherally OS,denies flashes, floaters and ocular pain.       Last edited by Bernarda Caffey, MD on 11/20/2019  1:34 AM. (History)    Pt states she feels like her peripheral vision has gotten worse since she had the laser procedure at the last visit, she finished the PF after 7 days as directed  Referring physician: [place], [place] OD [place],  [id number]   HISTORICAL INFORMATION:   Selected notes from the [place] Referral from Dr. [place] DeMarco for macular edema OS  LEE: 11.18.20, BCVA OD: 20/20 OS: 20/30+2  PMH: sickle cell disease, chronic pain, heart murmer, hx of retinal vasculitits    CURRENT MEDICATIONS: No current outpatient medications on file. (Ophthalmic Drugs)   No current facility-administered medications for this visit. (Ophthalmic Drugs)   Current Outpatient Medications (Other)  Medication Sig  . aspirin 81 MG EC tablet aspirin 81 mg tablet,delayed release  . Cholecalciferol (VITAMIN D3) 25 MCG (1000 UT) CAPS TAKE 1 CAPSULE BY MOUTH ONCE DAILY  . enoxaparin (LOVENOX) 40 MG/0.4ML injection enoxaparin 40 mg/0.4 mL subcutaneous syringe  . folic acid (FOLVITE) 1 MG tablet Take 1 tablet (1 mg total) by mouth daily.  Marland Kitchen ibuprofen (ADVIL) 600 MG tablet Take 1 tablet (600 mg total) by mouth every 8 (eight) hours as needed.  .  mometasone (ELOCON) 0.1 % cream Apply 1 application topically as needed.  Marland Kitchen oxycodone (OXY-IR) 5 MG capsule Take 1 capsule (5 mg total) by mouth every 4 (four) hours as needed.  . progesterone (PROMETRIUM) 200 MG capsule progesterone micronized 200 mg capsule  INSERT 2 CAPSULES VAGINALLY TWICE DAILY  . cetirizine (ZYRTEC) 10 MG tablet Take 10 mg by mouth daily as needed.   . famotidine (PEPCID) 40 MG tablet famotidine 40 mg tablet  . ondansetron (ZOFRAN ODT) 4 MG disintegrating tablet Take 1 tablet (4 mg total) by mouth every 8 (eight) hours as needed for nausea or vomiting.   No current facility-administered medications for this visit. (Other)      REVIEW OF SYSTEMS: ROS    Positive for: Eyes   Negative for: Constitutional, Gastrointestinal, Neurological, Skin, Genitourinary, Musculoskeletal, HENT, Endocrine, Cardiovascular, Respiratory, Psychiatric, Allergic/Imm, Heme/Lymph   Last edited by Zenovia Jordan, LPN on [id number]  [id number] PM. (History)       ALLERGIES Allergies  Allergen Reactions  . Keflex [Cephalexin] Swelling    Face swelling    PAST MEDICAL HISTORY Past Medical History:  Diagnosis Date  . Chronic pain    with sickle cell   . Eczema   . Heart murmur    07-13-2019  per pt since childhood,  has not ever had a echo done ,  asymptomatic  . History of blood transfusion    x3   . Missed  ab   . Sickle cell anemia ([place])    followed by pcp---  last crisis 12-14-2018  . Vitamin D deficiency   . Wears glasses    Past Surgical History:  Procedure Laterality Date  . LAPAROSCOPIC CHOLECYSTECTOMY  1997    FAMILY HISTORY Family History  Problem Relation Age of Onset  . Diabetes Father   . Hypertension Father   . Heart disease Brother   . Cancer Maternal Grandmother   . Heart disease Brother   . Breast cancer Paternal Aunt   . Cancer Paternal Uncle     SOCIAL HISTORY Social History   Tobacco Use  . Smoking status: Never Smoker  . Smokeless tobacco: Never  Used  Substance Use Topics  . Alcohol use: Not Currently    Comment: socially  . Drug use: Never         OPHTHALMIC EXAM:  Base Eye Exam    Visual Acuity (Snellen - Linear)      Right Left   Dist cc 20/20 -1 20/40   Dist ph cc NI 20/30   Correction: Glasses       Tonometry (Tonopen, 1:12 PM)      Right Left   Pressure 10 12       Pupils      Dark Light Shape React APD   Right 3 2 Round Brisk None   Left 3 2 Round Brisk None       Visual Fields (Counting fingers)      Left Right     Full   Restrictions Partial outer superior temporal, inferior temporal, superior nasal, inferior nasal deficiencies        Extraocular Movement      Right Left    Full, Ortho Full, Ortho       Neuro/Psych    Oriented x3: Yes   Mood/Affect: Normal       Dilation    Both eyes: 1.0% Mydriacyl, 2.5% Phenylephrine @ 1:11 PM        Slit Lamp and Fundus Exam    Slit Lamp Exam      Right Left   Lids/Lashes Normal Normal   Conjunctiva/Sclera Mild Melanosis Mild Melanosis   Cornea Clear, mild tear film debris Clear, mild tear film debris   Anterior Chamber Deep and quiet, no cell or flare Deep and quiet, no cell or flare   Iris Round and reactive Round and dilated   Lens Clear Clear   Vitreous Normal +Cell, mild Vitreous syneresis       Fundus Exam      Right Left   Disc Pink and Sharp, mild PPA Pink and Sharp, 360 degrees of PPP   C/D Ratio 0.3 0.1   Macula Flat, Blunted foveal reflex, No heme or edema Blunted foveal reflex, +Cystic changes centrally--slightly improved, mild Epiretinal membrane, RPE mottling and clumping   Vessels Vascular attenuation, Tortuous, mild Copper wiring, AV crossing changes Vascular attenuation, Copper wiring, Tortuous   Periphery Attached, no heme    Attached, focal CR scar at 0730 equator, good PRP laser changes 360          IMAGING AND PROCEDURES  Imaging and Procedures for @TODAY @  OCT, Retina - OU - Both Eyes       Right Eye Quality  was good. Central Foveal Thickness: 213. Progression has been stable. Findings include normal foveal contour, no IRF, no SRF (Broad foveal depression).   Left Eye Quality was good. Central Foveal Thickness: 220. Progression has improved.  Findings include intraretinal fluid, epiretinal membrane, vitreomacular adhesion , outer retinal atrophy, normal foveal contour (Interval improvement in IRF/cystic changes, trace vitreous opacities stable from prior on widefield scans).   Notes *Images captured and stored on drive  Diagnosis / Impression: OD: NFP, no IRF/SRF OS: Interval improvement in IRF/cystic changes; trace vitreous opacities stable from prior on widefield scans   See below  Abbreviations: NFP - Normal foveal profile. CME - cystoid macular edema. PED - pigment epithelial detachment. IRF - intraretinal fluid. SRF - subretinal fluid. EZ - ellipsoid zone. ERM - epiretinal membrane. ORA - outer retinal atrophy. ORT - outer retinal tubulation. 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 Retinal edema  H35.81 OCT, Retina - OU - Both Eyes  4. Hypertensive retinopathy of both eyes  H35.033    **Pt presents acutely today for subjectively blurred vision and decreased peripheral vision - BCVA remains 20/30 OS - OCT shows stable peripheral outer retinal atrophy / ellipsoid loss - no changes on exam besides new PRP laser 360 - provided reassurance that eye exam is stable with good laser in place - f/u as scheduled, sooner prn  1,2. Peripheral chorioretinal inflammation and vasculitis OS  - pt reports vague history "vasculitis" OS -- first diagnosed in ?[place] in 2016 -- doesn't think she saw a retina specialist  - states in 2016 had an episode of seeing spots then vision loss OS, told she had "vasculitis"  - +vitreous  cell  - exam shows peripapillary atrophy and pigment deposition  - OCT with diffuse peripheral ellipsoid loss with central sparing + central CME -- slightly improved today  - FA 12.2.20 shows mild, late perivascular leakage, peripheral midzone OS (very late appearing--likely increased signal if study were allowed to continue beyond 10 min); OD normal study  - BCVA stable at 20/30 OS, but 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 Matilde Bash, Uveitis/Retina expert, who initiated extensive work up and advised laser PRP  - of note, pt is pregnant  - discussed today's findings with patient             - s/p PRP OS (01.25.21) -- good laser changes 360  - completed PF QID OS x7 days  - f/u here as scheduled on December 24, 2019  - has f/u appt with Dr. Manuella Ghazi on January 08, 2020  3. Retinal edema  - OCT shows interval improvement in central CME/IRF, but FA has no corresponding central hyperfluorescent leakage/staining--petaloid or otherwise  4. History of Sickle Cell SS disease  - no SS retinopathy  Ophthalmic Meds Ordered this visit:  No orders of the defined types were placed in this encounter.      Return for f/u as scheduled.  There are no Patient Instructions on file for this visit.   Explained the diagnoses, plan, and follow up with the patient and they expressed understanding.  Patient expressed understanding of the importance of proper follow up care.   This document serves as a record of services personally performed by Gardiner Sleeper, MD, PhD. It was created on their behalf by Ernest Mallick, OA, an ophthalmic assistant. The creation of this record is the provider's dictation and/or  activities during the visit.    Electronically signed by: Ernest Mallick, OA 02.01.2021 1:34 AM  Gardiner Sleeper, M.D., Ph.D. Diseases & Surgery of the Retina and Vitreous [facility]  I have reviewed the above  documentation for accuracy and completeness, and I agree with the above. Gardiner Sleeper, M.D., Ph.D. 11/20/19 1:47 AM    Abbreviations: M myopia (nearsighted); A astigmatism; H hyperopia (farsighted); P presbyopia; Mrx spectacle prescription;  CTL contact lenses; OD right eye; OS left eye; OU both eyes  XT exotropia; ET esotropia; PEK punctate epithelial keratitis; PEE punctate epithelial erosions; DES dry eye syndrome; MGD meibomian gland dysfunction; ATs artificial tears; PFAT's preservative free artificial tears; [place] nuclear sclerotic cataract; PSC posterior subcapsular cataract; ERM epi-retinal membrane; PVD posterior vitreous detachment; RD retinal detachment; DM diabetes mellitus; DR diabetic retinopathy; NPDR non-proliferative diabetic retinopathy; PDR proliferative diabetic retinopathy; CSME clinically significant macular edema; DME diabetic macular edema; dbh dot blot hemorrhages; CWS cotton wool spot; POAG primary open angle glaucoma; C/D cup-to-disc ratio; HVF humphrey visual field; GVF goldmann visual field; OCT optical coherence tomography; IOP intraocular pressure; BRVO Branch retinal vein occlusion; CRVO central retinal vein occlusion; CRAO central retinal artery occlusion; BRAO branch retinal artery occlusion; RT retinal tear; SB scleral buckle; PPV pars plana vitrectomy; VH Vitreous hemorrhage; PRP panretinal laser photocoagulation; IVK intravitreal kenalog; VMT vitreomacular traction; MH Macular hole;  NVD neovascularization of the disc; NVE neovascularization elsewhere; AREDS age related eye disease study; ARMD age related macular degeneration; POAG primary open angle glaucoma; EBMD epithelial/anterior basement membrane dystrophy; ACIOL anterior chamber intraocular lens; IOL intraocular lens; PCIOL posterior chamber intraocular lens; Phaco/IOL phacoemulsification with intraocular lens placement; [place] photorefractive keratectomy; LASIK laser assisted in situ keratomileusis; HTN hypertension;  DM diabetes mellitus; COPD chronic obstructive pulmonary disease

## 2019-11-21 LAB — OB RESULTS CONSOLE HEPATITIS B SURFACE ANTIGEN: Hepatitis B Surface Ag: NEGATIVE

## 2019-11-21 LAB — OB RESULTS CONSOLE GC/CHLAMYDIA
Chlamydia: NEGATIVE
Gonorrhea: NEGATIVE

## 2019-11-21 LAB — OB RESULTS CONSOLE ABO/RH: RH Type: POSITIVE

## 2019-11-21 LAB — OB RESULTS CONSOLE ANTIBODY SCREEN: Antibody Screen: NEGATIVE

## 2019-11-21 LAB — OB RESULTS CONSOLE HIV ANTIBODY (ROUTINE TESTING): HIV: NONREACTIVE

## 2019-11-21 LAB — OB RESULTS CONSOLE RUBELLA ANTIBODY, IGM: Rubella: IMMUNE

## 2019-11-21 LAB — OB RESULTS CONSOLE RPR: RPR: NONREACTIVE

## 2019-12-17 ENCOUNTER — Telehealth: Payer: Self-pay | Admitting: Family Medicine

## 2019-12-17 NOTE — Telephone Encounter (Signed)
Called and spoke with patient. Asked that she be advised by OB/GYN for this. She states she has called them and is waiting on response. Thanks!

## 2019-12-17 NOTE — Telephone Encounter (Signed)
Pt wants opinion on COVID vaccine. She is [redacted] weeks pregnant.

## 2019-12-18 ENCOUNTER — Ambulatory Visit: Payer: BC Managed Care – PPO | Admitting: Family Medicine

## 2019-12-18 NOTE — Progress Notes (Signed)
[facility] Note  12/24/2019     CHIEF COMPLAINT Patient presents for Retina Follow Up   HISTORY OF PRESENT ILLNESS: Krista [name] is a 34 y.o. female who presents to the clinic today for:   HPI    Retina Follow Up    Patient presents with  Other.  In left eye.  This started months ago.  Severity is moderate.  Duration of 5 weeks.  Since onset it is stable.  I, the attending physician,  performed the HPI with the patient and updated documentation appropriately.          Comments    34 y/o female pt here for 5 wk f/u for peripheral focal chorioretinal inflammation OS.  S/p PRP OS 1.25.21.  No noticed change in [place] OU.  Eyes stay very dry.  Denies pain, flashes, floaters.  No gtts.       Last edited by Bernarda Caffey, MD on 12/24/2019 11:23 AM. (History)    Pt states   Referring physician: DeMarco, [place] OD [place], [place] [id number]   HISTORICAL INFORMATION:   Selected notes from the MEDICAL RECORD NUMBER Referral from Dr. [place] DeMarco for macular edema OS  LEE: 11.18.20, BCVA OD: 20/20 OS: 20/30+2  PMH: sickle cell disease, chronic pain, heart murmer, hx of retinal vasculitits    CURRENT MEDICATIONS: Current Outpatient Medications (Ophthalmic Drugs)  Medication Sig  . Bromfenac Sodium (PROLENSA) 0.07 % SOLN Place 1 drop into the left eye 4 (four) times daily.   No current facility-administered medications for this visit. (Ophthalmic Drugs)   Current Outpatient Medications (Other)  Medication Sig  . aspirin 81 MG EC tablet aspirin 81 mg tablet,delayed release  . Cholecalciferol (VITAMIN D3) 25 MCG (1000 UT) CAPS TAKE 1 CAPSULE BY MOUTH ONCE DAILY  . enoxaparin (LOVENOX) 40 MG/0.4ML injection enoxaparin 40 mg/0.4 mL subcutaneous syringe  . folic acid (FOLVITE) 1 MG tablet Take 1 tablet (1 mg total) by mouth daily.  Marland Kitchen ibuprofen (ADVIL) 600 MG tablet Take 1 tablet (600 mg total) by mouth every 8 (eight) hours as  needed.  . mometasone (ELOCON) 0.1 % cream Apply 1 application topically as needed.  Marland Kitchen oxycodone (OXY-IR) 5 MG capsule Take 1 capsule (5 mg total) by mouth every 4 (four) hours as needed.  . progesterone (PROMETRIUM) 200 MG capsule progesterone micronized 200 mg capsule  INSERT 2 CAPSULES VAGINALLY TWICE DAILY  . cetirizine (ZYRTEC) 10 MG tablet Take 10 mg by mouth daily as needed.   . famotidine (PEPCID) 40 MG tablet famotidine 40 mg tablet  . ondansetron (ZOFRAN ODT) 4 MG disintegrating tablet Take 1 tablet (4 mg total) by mouth every 8 (eight) hours as needed for nausea or vomiting.   No current facility-administered medications for this visit. (Other)      REVIEW OF SYSTEMS: ROS    Positive for: Gastrointestinal, Eyes   Negative for: Constitutional, Neurological, Skin, Genitourinary, Musculoskeletal, HENT, Endocrine, Cardiovascular, Respiratory, Psychiatric, Allergic/Imm, Heme/Lymph   Last edited by Matthew Folks, COA on 12/24/2019 10:06 AM. (History)       ALLERGIES Allergies  Allergen Reactions  . Keflex [Cephalexin] Swelling    Face swelling    PAST MEDICAL HISTORY Past Medical History:  Diagnosis Date  . Chronic pain    with sickle cell   . Eczema   . Heart murmur    07-13-2019  per pt since childhood,  has not ever had a echo done ,  asymptomatic  . History of blood transfusion    x3   . Missed ab   . Sickle cell anemia ([place])    followed by pcp---  last crisis 12-14-2018  . Vitamin D deficiency   . Wears glasses    Past Surgical History:  Procedure Laterality Date  . LAPAROSCOPIC CHOLECYSTECTOMY  1997    FAMILY HISTORY Family History  Problem Relation Age of Onset  . Diabetes Father   . Hypertension Father   . Heart disease Brother   . Cancer Maternal Grandmother   . Heart disease Brother   . Breast cancer Paternal Aunt   . Cancer Paternal Uncle     SOCIAL HISTORY Social History   Tobacco Use  . Smoking status: Never Smoker  . Smokeless  tobacco: Never Used  Substance Use Topics  . Alcohol use: Not Currently    Comment: socially  . Drug use: Never         OPHTHALMIC EXAM:  Base Eye Exam    Visual Acuity (Snellen - Linear)      Right Left   Dist cc 20/20 20/40   Dist ph cc  20/30 -2   Correction: Glasses       Tonometry (Tonopen, 10:08 AM)      Right Left   Pressure 11 11       Pupils      Dark Light Shape React APD   Right 3 2 Round Brisk None   Left 3 2 Round Brisk None       Visual Fields (Counting fingers)      Left Right     Full   Restrictions Partial outer superior temporal, inferior temporal, superior nasal, inferior nasal deficiencies        Extraocular Movement      Right Left    Full, Ortho Full, Ortho       Neuro/Psych    Oriented x3: Yes   Mood/Affect: Normal       Dilation    Both eyes: 1.0% Mydriacyl, 2.5% Phenylephrine @ 10:08 AM        Slit Lamp and Fundus Exam    Slit Lamp Exam      Right Left   Lids/Lashes Normal Normal   Conjunctiva/Sclera Mild Melanosis Mild Melanosis   Cornea Clear, mild tear film debris Clear, mild tear film debris   Anterior Chamber Deep and quiet, no cell or flare Deep and quiet, no cell or flare   Iris Round and reactive Round and dilated   Lens Clear Clear   Vitreous Mild Vitreous syneresis +Cell, mild Vitreous syneresis       Fundus Exam      Right Left   Disc Pink and Sharp, mild PPA Pink and Sharp, 360 degrees of PPP   C/D Ratio 0.3 0.2   Macula Flat, good foveal reflex, No heme or edema Blunted foveal reflex, +Cystic changes centrally--slightly increased, mild Epiretinal membrane, RPE mottling and clumping   Vessels Vascular attenuation, Tortuous, mild Copper wiring, AV crossing changes Vascular attenuation, Copper wiring, Tortuous   Periphery Attached, no heme    Attached, focal CR scar at 0730 equator, good PRP laser changes -- peripheral 360          IMAGING AND PROCEDURES  Imaging and Procedures for @TODAY @  OCT, Retina -  OU - Both Eyes       Right Eye Quality was good. Central Foveal Thickness: 206. Progression has been stable. Findings include normal foveal contour, no IRF,  no SRF (Broad foveal depression).   Left Eye Quality was good. Central Foveal Thickness: 384. Progression has worsened. Findings include intraretinal fluid, epiretinal membrane, vitreomacular adhesion , outer retinal atrophy, abnormal foveal contour, no SRF (Interval increase in IRF/cystic changes, trace vitreous opacities stable from prior on widefield scans).   Notes *Images captured and stored on drive  Diagnosis / Impression: OD: NFP, no IRF/SRF OS: Interval increase in IRF/cystic changes; trace vitreous opacities stable from prior on widefield scans   See below  Abbreviations: NFP - Normal foveal profile. CME - cystoid macular edema. PED - pigment epithelial detachment. IRF - intraretinal fluid. SRF - subretinal fluid. EZ - ellipsoid zone. ERM - epiretinal membrane. ORA - outer retinal atrophy. ORT - outer retinal tubulation. 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 Retinal edema  H35.81 OCT, Retina - OU - Both Eyes  4. Hypertensive retinopathy of both eyes  H35.033    1,2. Peripheral chorioretinal inflammation and vasculitis OS  - pt reports vague history "vasculitis" OS -- first diagnosed in ?[place] in 2016 -- doesn't think she saw a retina specialist  - states in 2016 had an episode of seeing spots then vision loss OS, told she had "vasculitis"  - +vitreous cell  - exam shows peripapillary atrophy and pigment deposition  - OCT with diffuse peripheral ellipsoid loss with central sparing + central CME slightly increased today  - FA (12.02.20) shows mild, late perivascular leakage, peripheral midzone OS (very late appearing--likely increased signal if  study were allowed to continue beyond 10 min); OD normal study  - BCVA stable at 20/30 OS, but 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 Matilde Bash, Uveitis/Retina expert, who initiated extensive work up and advised laser PRP  - of note, pt is pregnant  - discussed today's findings with patient             - s/p PRP OS (01.25.21) -- good laser changes 360  - start PF and Prolensa qid OS due to recurrent CME  - f/u here in 2 months  - has f/u appt with Dr. Manuella Ghazi on January 08, 2020  3. Retinal edema  - OCT shows interval improvement in central CME/IRF, but FA has no corresponding central hyperfluorescent leakage/staining--petaloid or otherwise  4. History of Sickle Cell SS disease  - no SS retinopathy  Ophthalmic Meds Ordered this visit:  Meds ordered this encounter  Medications  . Bromfenac Sodium (PROLENSA) 0.07 % SOLN    Sig: Place 1 drop into the left eye 4 (four) times daily.    Dispense:  6 mL    Refill:  3       Return in about 2 months (around 02/23/2020) for f/u peripheral chorioretinal inflammation OS, DFE, OCT.  There are no Patient Instructions on file for this visit.   Explained the diagnoses, plan, and follow up with the patient and they expressed understanding.  Patient expressed understanding of the importance of proper follow up care.   This document serves as a record of services personally performed by Gardiner Sleeper, MD, PhD. It was created on their behalf by Leeann Must, [place], a certified ophthalmic assistant. The creation of this  record is the provider's dictation and/or activities during the visit.    Electronically signed by: Leeann Must, COA @TODAY @ 1:13 PM   This document serves as a record of services personally performed by Gardiner Sleeper, MD, PhD. It was created on their behalf by Ernest Mallick, OA, an ophthalmic assistant. The creation of this record is the provider's dictation  and/or activities during the visit.    Electronically signed by: Ernest Mallick, OA 03.08.2021 1:13 PM   Gardiner Sleeper, M.D., Ph.D. Diseases & Surgery of the Retina and Vitreous [facility]  I have reviewed the above documentation for accuracy and completeness, and I agree with the above. Gardiner Sleeper, M.D., Ph.D. 12/24/19 1:13 PM   Abbreviations: M myopia (nearsighted); A astigmatism; H hyperopia (farsighted); P presbyopia; Mrx spectacle prescription;  CTL contact lenses; OD right eye; OS left eye; OU both eyes  XT exotropia; ET esotropia; PEK punctate epithelial keratitis; PEE punctate epithelial erosions; DES dry eye syndrome; MGD meibomian gland dysfunction; ATs artificial tears; PFAT's preservative free artificial tears; [place] nuclear sclerotic cataract; PSC posterior subcapsular cataract; ERM epi-retinal membrane; PVD posterior vitreous detachment; RD retinal detachment; DM diabetes mellitus; DR diabetic retinopathy; NPDR non-proliferative diabetic retinopathy; PDR proliferative diabetic retinopathy; CSME clinically significant macular edema; DME diabetic macular edema; dbh dot blot hemorrhages; CWS cotton wool spot; POAG primary open angle glaucoma; C/D cup-to-disc ratio; HVF humphrey visual field; GVF goldmann visual field; OCT optical coherence tomography; IOP intraocular pressure; BRVO Branch retinal vein occlusion; CRVO central retinal vein occlusion; CRAO central retinal artery occlusion; BRAO branch retinal artery occlusion; RT retinal tear; SB scleral buckle; PPV pars plana vitrectomy; VH Vitreous hemorrhage; PRP panretinal laser photocoagulation; IVK intravitreal kenalog; VMT vitreomacular traction; MH Macular hole;  NVD neovascularization of the disc; NVE neovascularization elsewhere; AREDS age related eye disease study; ARMD age related macular degeneration; POAG primary open angle glaucoma; EBMD epithelial/anterior basement membrane dystrophy; ACIOL anterior  chamber intraocular lens; IOL intraocular lens; PCIOL posterior chamber intraocular lens; Phaco/IOL phacoemulsification with intraocular lens placement; [place] photorefractive keratectomy; LASIK laser assisted in situ keratomileusis; HTN hypertension; DM diabetes mellitus; COPD chronic obstructive pulmonary disease

## 2019-12-24 ENCOUNTER — Encounter (INDEPENDENT_AMBULATORY_CARE_PROVIDER_SITE_OTHER): Payer: Self-pay | Admitting: Ophthalmology

## 2019-12-24 ENCOUNTER — Ambulatory Visit (INDEPENDENT_AMBULATORY_CARE_PROVIDER_SITE_OTHER): Payer: BC Managed Care – PPO | Admitting: Ophthalmology

## 2019-12-24 ENCOUNTER — Telehealth: Payer: Self-pay | Admitting: Family Medicine

## 2019-12-24 DIAGNOSIS — H3581 Retinal edema: Secondary | ICD-10-CM

## 2019-12-24 DIAGNOSIS — H30032 Focal chorioretinal inflammation, peripheral, left eye: Secondary | ICD-10-CM

## 2019-12-24 DIAGNOSIS — H35033 Hypertensive retinopathy, bilateral: Secondary | ICD-10-CM | POA: Diagnosis not present

## 2019-12-24 DIAGNOSIS — H35062 Retinal vasculitis, left eye: Secondary | ICD-10-CM | POA: Diagnosis not present

## 2019-12-24 MED ORDER — PROLENSA 0.07 % OP SOLN: 1.0000 [drp] | Freq: Four times a day (QID) | OPHTHALMIC | 3 refills | Status: DC

## 2019-12-24 NOTE — Telephone Encounter (Signed)
Pt called and reminded of appointment 

## 2019-12-25 ENCOUNTER — Ambulatory Visit (INDEPENDENT_AMBULATORY_CARE_PROVIDER_SITE_OTHER): Payer: BC Managed Care – PPO | Admitting: Family Medicine

## 2019-12-25 ENCOUNTER — Other Ambulatory Visit: Payer: Self-pay

## 2019-12-25 ENCOUNTER — Encounter: Payer: Self-pay | Admitting: Family Medicine

## 2019-12-25 VITALS — BP 105/64 | HR 93 | Temp 98.8°F | Resp 16 | Ht 67.0 in | Wt 161.0 lb

## 2019-12-25 DIAGNOSIS — Z3A16 16 weeks gestation of pregnancy: Secondary | ICD-10-CM

## 2019-12-25 DIAGNOSIS — D571 Sickle-cell disease without crisis: Secondary | ICD-10-CM

## 2019-12-25 DIAGNOSIS — Z3492 Encounter for supervision of normal pregnancy, unspecified, second trimester: Secondary | ICD-10-CM

## 2019-12-25 DIAGNOSIS — O99012 Anemia complicating pregnancy, second trimester: Secondary | ICD-10-CM

## 2019-12-25 DIAGNOSIS — D5700 Hb-SS disease with crisis, unspecified: Secondary | ICD-10-CM

## 2019-12-25 LAB — POCT URINALYSIS DIPSTICK
Bilirubin, UA: NEGATIVE
Blood, UA: NEGATIVE
Glucose, UA: NEGATIVE
Ketones, UA: 40
Leukocytes, UA: NEGATIVE
Nitrite, UA: NEGATIVE
Protein, UA: NEGATIVE
Spec Grav, UA: 1.02 (ref 1.010–1.025)
Urobilinogen, UA: 1 E.U./dL
pH, UA: 5.5 (ref 5.0–8.0)

## 2019-12-25 NOTE — Progress Notes (Signed)
[facility]   Established Patient Office Visit  Subjective:  Patient ID: Krista [name], female    DOB: 1986-07-19  Age: 34 y.o. MRN: [id number]  CC:  Chief Complaint  Patient presents with  . Sickle Cell Anemia    HPI Krista [name] is a very pleasant 34 year old female with a medical history significant for sickle cell disease and vitamin D deficiency presents for follow up of chronic condition. Krista [name] is currently in 2nd trimester of pregnancy. Patient is very excited about pregnancy. She is followed by Dr. Linda Hedges, obstetrician at [facility]. Patient is followed closed due to high risk pregnancy.  Krista [name] has very well controlled sickle cell disease. She has infrequent pain crisis. Her baseline hemoglobin is 8.0-9.0 g/dL. She hydrates consistently and takes folic acid. She denies pain at this time. She also denies headache, chest pain, urinary symptoms, nausea, vomiting, or diarrhea.   Past Medical History:  Diagnosis Date  . Chronic pain    with sickle cell   . Eczema   . Heart murmur    07-13-2019  per pt since childhood,  has not ever had a echo done ,  asymptomatic  . History of blood transfusion    x3   . Missed ab   . Sickle cell anemia ([place])    followed by pcp---  last crisis 12-14-2018  . Vitamin D deficiency   . Wears glasses     Past Surgical History:  Procedure Laterality Date  . EYE SURGERY Left 11/12/2019  . LAPAROSCOPIC CHOLECYSTECTOMY  1997    Family History  Problem Relation Age of Onset  . Diabetes Father   . Hypertension Father   . Heart disease Brother   . Cancer Maternal Grandmother   . Heart disease Brother   . Breast cancer Paternal Aunt   . Cancer Paternal Uncle     Social History   Socioeconomic History  . Marital status: Married    Spouse name: n/a  . Number of children: 0  . Years of education: 16+  . Highest education level: Not on file  Occupational History  .  Occupation: Product manager: [employer]  Tobacco Use  . Smoking status: Never Smoker  . Smokeless tobacco: Never Used  Substance and Sexual Activity  . Alcohol use: Not Currently    Comment: socially  . Drug use: Never  . Sexual activity: Yes    Birth control/protection: None  Other Topics Concern  . Not on file  Social History Narrative   ** Merged History Encounter **       Lives alone.  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   Social Determinants of Health   Financial Resource Strain:   . Difficulty of Paying Living Expenses: Not on file  Food Insecurity:   . Worried About Charity fundraiser in the Last Year: Not on file  . Ran Out of Food in the Last Year: Not on file  Transportation Needs:   . Lack of Transportation (Medical): Not on file  . Lack of Transportation (Non-Medical): Not on file  Physical Activity:   . Days of Exercise per Week: Not on file  . Minutes of Exercise per Session: Not on file  Stress:   . Feeling of Stress : Not on file  Social Connections:   . Frequency of Communication with Friends and Family: Not  on file  . Frequency of Social Gatherings with Friends and Family: Not on file  . Attends Religious Services: Not on file  . Active Member of Clubs or Organizations: Not on file  . Attends Archivist Meetings: Not on file  . Marital Status: Not on file  Intimate Partner Violence:   . Fear of Current or Ex-Partner: Not on file  . Emotionally Abused: Not on file  . Physically Abused: Not on file  . Sexually Abused: Not on file    Outpatient Medications Prior to Visit  Medication Sig Dispense Refill  . aspirin 81 MG EC tablet aspirin 81 mg tablet,delayed release    . Bromfenac Sodium (PROLENSA) 0.07 % SOLN Place 1 drop into the left eye 4 (four) times daily. 6 mL 3  . enoxaparin (LOVENOX) 40 MG/0.4ML injection enoxaparin 40 mg/0.4 mL  subcutaneous syringe    . folic acid (FOLVITE) 1 MG tablet Take 1 tablet (1 mg total) by mouth daily. (Patient taking differently: Take 4 mg by mouth daily. ) 90 tablet 3  . ibuprofen (ADVIL) 600 MG tablet Take 1 tablet (600 mg total) by mouth every 8 (eight) hours as needed. 30 tablet 0  . mometasone (ELOCON) 0.1 % cream Apply 1 application topically as needed. 45 g 2  . oxycodone (OXY-IR) 5 MG capsule Take 1 capsule (5 mg total) by mouth every 4 (four) hours as needed. 20 capsule 0  . Prenatal Vit-Fe Fumarate-FA (PRENATAL MULTIVITAMIN) TABS tablet Take 1 tablet by mouth daily at 12 noon.    . Cholecalciferol (VITAMIN D3) 25 MCG (1000 UT) CAPS TAKE 1 CAPSULE BY MOUTH ONCE DAILY (Patient not taking: Reported on 12/25/2019) 100 capsule 11  . cetirizine (ZYRTEC) 10 MG tablet Take 10 mg by mouth daily as needed.     . famotidine (PEPCID) 40 MG tablet famotidine 40 mg tablet    . ondansetron (ZOFRAN ODT) 4 MG disintegrating tablet Take 1 tablet (4 mg total) by mouth every 8 (eight) hours as needed for nausea or vomiting. 20 tablet 0  . progesterone (PROMETRIUM) 200 MG capsule progesterone micronized 200 mg capsule  INSERT 2 CAPSULES VAGINALLY TWICE DAILY     No facility-administered medications prior to visit.    Allergies  Allergen Reactions  . Keflex [Cephalexin] Swelling    Face swelling    ROS Review of Systems    Objective:    Physical Exam  BP 105/64 (BP Location: Left Arm, Patient Position: Sitting, Cuff Size: Normal)   Pulse 93   Temp 98.8 F (37.1 C) (Oral)   Resp 16   Ht 5\' 7"  (1.702 m)   Wt 161 lb (73 kg)   LMP 08/09/2019   SpO2 98%   BMI 25.22 kg/m  Wt Readings from Last 3 Encounters:  12/25/19 161 lb (73 kg)  08/21/19 163 lb (73.9 kg)  06/13/19 162 lb (73.5 kg)     There are no preventive care reminders to display for this patient.  There are no preventive care reminders to display for this patient.  Lab Results  Component Value Date   TSH 2.21 03/24/2017    Lab Results  Component Value Date   WBC 15.9 (H) 09/25/2019   HGB 8.4 (L) 09/25/2019   HCT 24.7 (L) 09/25/2019   MCV 91 09/25/2019   PLT 735 (H) 09/25/2019   Lab Results  Component Value Date   NA 139 08/21/2019   K 4.2 08/21/2019   CO2 21 08/21/2019   GLUCOSE 87  08/21/2019   BUN 5 (L) 08/21/2019   CREATININE 0.47 (L) 08/21/2019   BILITOT 3.3 (H) 08/21/2019   ALKPHOS 88 08/21/2019   AST 18 08/21/2019   ALT 14 08/21/2019   PROT 7.1 08/21/2019   ALBUMIN 4.8 08/21/2019   CALCIUM 9.8 08/21/2019   ANIONGAP 9 12/14/2018   Lab Results  Component Value Date   CHOL 112 (L) 08/12/2015   Lab Results  Component Value Date   HDL 26 (L) 08/12/2015   Lab Results  Component Value Date   LDLCALC 67 08/12/2015   Lab Results  Component Value Date   TRIG 95 08/12/2015   Lab Results  Component Value Date   CHOLHDL 4.3 08/12/2015   Lab Results  Component Value Date   HGBA1C <4.0 03/24/2017      Assessment & Plan:   Problem List Items Addressed This Visit      Other   Sickle cell anemia (HCC) - Primary (Chronic)   Relevant Orders   Urinalysis Dipstick (Completed)      1. Hb-SS disease with crisis ([place])  Continue folic acid 1 mg daily to prevent aplastic bone marrow crises.   Pulmonary evaluation - Patient denies severe recurrent wheezes, shortness of breath with exercise, or persistent cough. If these symptoms develop, pulmonary function tests with spirometry will be ordered, and if abnormal, plan on referral to Pulmonology for further evaluation.   Cardiac - Routine screening for pulmonary hypertension is not recommended.   Eye - High risk of proliferative retinopathy. Annual eye exam with retinal exam recommended to patient.  Immunization status - Will defer to obstetrics for any upcoming vaccinations  Acute and chronic painful episodes:  Recommend Tylenol 500 mg every 6 hours as needed. In the event of sickle cell pain crisis, contact day infusion center up  until [redacted] weeks gestation.   - Urinalysis Dipstick  Second trimester pregnancy Continue to follow up with obstetrics as often as they recommend. Recommend increased rest, hydration, and a balanced diet.     Follow-up: Return in about 8 weeks (around 02/19/2020).     [name]  APRN, MSN, FNP-C Patient [facility] [address] [place], [place] [address] [phone]

## 2019-12-25 NOTE — Patient Instructions (Signed)
Eating Plan for Pregnant Women While you are pregnant, your body requires additional nutrition to help support your growing baby. You also have a higher need for some vitamins and minerals, such as folic acid, calcium, iron, and vitamin D. Eating a healthy, well-balanced diet is very important for your health and your baby's health. Your need for extra calories varies for the three 80-monthsegments of your pregnancy (trimesters). For most women, it is recommended to consume:  150 extra calories a day during the first trimester.  300 extra calories a day during the second trimester.  300 extra calories a day during the third trimester. What are tips for following this plan?   Do not try to lose weight or go on a diet during pregnancy.  Limit your overall intake of foods that have "empty calories." These are foods that have little nutritional value, such as sweets, desserts, candies, and sugar-sweetened beverages.  Eat a variety of foods (especially fruits and vegetables) to get a full range of vitamins and minerals.  Take a prenatal vitamin to help meet your additional vitamin and mineral needs during pregnancy, specifically for folic acid, iron, calcium, and vitamin D.  Remember to stay active. Ask your health care provider what types of exercise and activities are safe for you.  Practice good food safety and cleanliness. Wash your hands before you eat and after you prepare raw meat. Wash all fruits and vegetables well before peeling or eating. Taking these actions can help to prevent food-borne illnesses that can be very dangerous to your baby, such as listeriosis. Ask your health care provider for more information about listeriosis. What does 150 extra calories look like? Healthy options that provide 150 extra calories each day could be any of the following:  6-8 oz (170-230 g) of plain low-fat yogurt with  cup of berries.  1 apple with 2 teaspoons (11 g) of peanut butter.  Cut-up  vegetables with  cup (60 g) of hummus.  8 oz (230 mL) or 1 cup of low-fat chocolate milk.  1 stick of string cheese with 1 medium orange.  1 peanut butter and jelly sandwich that is made with one slice of whole-wheat bread and 1 tsp (5 g) of peanut butter. For 300 extra calories, you could eat two of those healthy options each day. What is a healthy amount of weight to gain? The right amount of weight gain for you is based on your BMI before you became pregnant. If your BMI:  Was less than 18 (underweight), you should gain 28-40 lb (13-18 kg).  Was 18-24.9 (normal), you should gain 25-35 lb (11-16 kg).  Was 25-29.9 (overweight), you should gain 15-25 lb (7-11 kg).  Was 30 or greater (obese), you should gain 11-20 lb (5-9 kg). What if I am having twins or multiples? Generally, if you are carrying twins or multiples:  You may need to eat 300-600 extra calories a day.  The recommended range for total weight gain is 25-54 lb (11-25 kg), depending on your BMI before pregnancy.  Talk with your health care provider to find out about nutritional needs, weight gain, and exercise that is right for you. What foods can I eat?  Fruits All fruits. Eat a variety of colors and types of fruit. Remember to wash your fruits well before peeling or eating. Vegetables All vegetables. Eat a variety of colors and types of vegetables. Remember to wash your vegetables well before peeling or eating. Grains All grains. Choose whole grains, such  as whole-wheat bread, oatmeal, or brown rice. Meats and other protein foods Lean meats, including chicken, [place], fish, and lean cuts of beef, veal, or pork. If you eat fish or seafood, choose options that are higher in omega-3 fatty acids and lower in mercury, such as salmon, herring, mussels, trout, sardines, pollock, shrimp, crab, and lobster. Tofu. Tempeh. Beans. Eggs. Peanut butter and other nut butters. Make sure that all meats, poultry, and eggs are cooked to  food-safe temperatures or "well-done." Two or more servings of fish are recommended each week in order to get the most benefits from omega-3 fatty acids that are found in seafood. Choose fish that are lower in mercury. You can find more information online:  GuamGaming.ch Dairy Pasteurized milk and milk alternatives (such as almond milk). Pasteurized yogurt and pasteurized cheese. Cottage cheese. Sour cream. Beverages Water. Juices that contain 100% fruit juice or vegetable juice. Caffeine-free teas and decaffeinated coffee. Drinks that contain caffeine are okay to drink, but it is better to avoid caffeine. Keep your total caffeine intake to less than 200 mg each day (which is 12 oz or 355 mL of coffee, tea, or soda) or the limit as told by your health care provider. Fats and oils Fats and oils are okay to include in moderation. Sweets and desserts Sweets and desserts are okay to include in moderation. Seasoning and other foods All pasteurized condiments. The items listed above may not be a complete list of foods and beverages you can eat. Contact a dietitian for more information. What foods are not recommended? Fruits Unpasteurized fruit juices. Vegetables Raw (unpasteurized) vegetable juices. Meats and other protein foods Lunch meats, bologna, hot dogs, or other deli meats. (If you must eat those meats, reheat them until they are steaming hot.) Refrigerated pat, meat spreads from a meat counter, smoked seafood that is found in the refrigerated section of a store. Raw or undercooked meats, poultry, and eggs. Raw fish, such as sushi or sashimi. Fish that have high mercury content, such as tilefish, shark, swordfish, and king mackerel. To learn more about mercury in fish, talk with your health care provider or look for online resources, such as:  [employer]amGaming.ch Dairy Raw (unpasteurized) milk and any foods that have raw milk in them. Soft cheeses, such as feta, queso blanco, queso fresco, Brie,  Camembert cheeses, blue-veined cheeses, and Panela cheese (unless it is made with pasteurized milk, which must be stated on the label). Beverages Alcohol. Sugar-sweetened beverages, such as sodas, teas, or energy drinks. Seasoning and other foods Homemade fermented foods and drinks, such as pickles, sauerkraut, or kombucha drinks. (Store-bought pasteurized versions of these are okay.) Salads that are made in a store or deli, such as ham salad, chicken salad, egg salad, tuna salad, and seafood salad. The items listed above may not be a complete list of foods and beverages you should avoid. Contact a dietitian for more information. Where to find more information To calculate the number of calories you need based on your height, weight, and activity level, you can use an online calculator such as:  MobileTransition.ch To calculate how much weight you should gain during pregnancy, you can use an online pregnancy weight gain calculator such as:  StreamingFood.com.cy Summary  While you are pregnant, your body requires additional nutrition to help support your growing baby.  Eat a variety of foods, especially fruits and vegetables to get a full range of vitamins and minerals.  Practice good food safety and cleanliness. Wash your hands before you eat  and after you prepare raw meat. Wash all fruits and vegetables well before peeling or eating. Taking these actions can help to prevent food-borne illnesses, such as listeriosis, that can be very dangerous to your baby.  Do not eat raw meat or fish. Do not eat fish that have high mercury content, such as tilefish, shark, swordfish, and king mackerel. Do not eat unpasteurized (raw) dairy.  Take a prenatal vitamin to help meet your additional vitamin and mineral needs during pregnancy, specifically for folic acid, iron, calcium, and vitamin D. This information is not intended to replace advice given to you by  your health care provider. Make sure you discuss any questions you have with your health care provider. Document Revised: 02/22/2019 Document Reviewed: 07/01/2017 Elsevier Patient Education  2020 Elsevier Inc.  

## 2020-02-14 ENCOUNTER — Other Ambulatory Visit: Payer: Self-pay

## 2020-02-14 ENCOUNTER — Encounter (HOSPITAL_COMMUNITY): Payer: Self-pay | Admitting: Obstetrics & Gynecology

## 2020-02-14 ENCOUNTER — Other Ambulatory Visit: Payer: Self-pay | Admitting: Student

## 2020-02-14 ENCOUNTER — Telehealth (HOSPITAL_COMMUNITY): Payer: Self-pay | Admitting: General Practice

## 2020-02-14 ENCOUNTER — Inpatient Hospital Stay (HOSPITAL_COMMUNITY)
Admission: AD | Admit: 2020-02-14 | Discharge: 2020-02-14 | Disposition: A | Discharge status: Home / Self Care | Payer: BC Managed Care – PPO | Attending: Obstetrics & Gynecology | Admitting: Obstetrics & Gynecology

## 2020-02-14 DIAGNOSIS — D5700 Hb-SS disease with crisis, unspecified: Secondary | ICD-10-CM

## 2020-02-14 DIAGNOSIS — O99012 Anemia complicating pregnancy, second trimester: Secondary | ICD-10-CM | POA: Insufficient documentation

## 2020-02-14 DIAGNOSIS — D5709 Hb-ss disease with crisis with other specified complication: Secondary | ICD-10-CM | POA: Diagnosis not present

## 2020-02-14 DIAGNOSIS — Z3A23 23 weeks gestation of pregnancy: Secondary | ICD-10-CM | POA: Diagnosis not present

## 2020-02-14 LAB — URINALYSIS, ROUTINE W REFLEX MICROSCOPIC
Bilirubin Urine: NEGATIVE
Glucose, UA: NEGATIVE mg/dL
Hgb urine dipstick: NEGATIVE
Ketones, ur: 5 mg/dL — AB
Leukocytes,Ua: NEGATIVE
Nitrite: NEGATIVE
Protein, ur: NEGATIVE mg/dL
Specific Gravity, Urine: 1.011 (ref 1.005–1.030)
pH: 6 (ref 5.0–8.0)

## 2020-02-14 LAB — COMPREHENSIVE METABOLIC PANEL
ALT: 18 U/L (ref 0–44)
AST: 24 U/L (ref 15–41)
Albumin: 3.3 g/dL — ABNORMAL LOW (ref 3.5–5.0)
Alkaline Phosphatase: 58 U/L (ref 38–126)
Anion gap: 10 (ref 5–15)
BUN: <5 mg/dL — ABNORMAL LOW (ref 6–20)
CO2: 20 mmol/L — ABNORMAL LOW (ref 22–32)
Calcium: 8.5 mg/dL — ABNORMAL LOW (ref 8.9–10.3)
Chloride: 109 mmol/L (ref 98–111)
Creatinine, Ser: 0.45 mg/dL (ref 0.44–1.00)
GFR calc Af Amer: >60 mL/min (ref >60)
GFR calc non Af Amer: >60 mL/min (ref >60)
Glucose, Bld: 114 mg/dL — ABNORMAL HIGH (ref 70–99)
Potassium: 3.5 mmol/L (ref 3.5–5.1)
Sodium: 139 mmol/L (ref 135–145)
Total Bilirubin: 1.8 mg/dL — ABNORMAL HIGH (ref 0.3–1.2)
Total Protein: 6.3 g/dL — ABNORMAL LOW (ref 6.5–8.1)

## 2020-02-14 LAB — CBC
HCT: 23.2 % — ABNORMAL LOW (ref 36.0–46.0)
Hemoglobin: 7.9 g/dL — ABNORMAL LOW (ref 12.0–15.0)
MCH: 34.2 pg — ABNORMAL HIGH (ref 26.0–34.0)
MCHC: 34.1 g/dL (ref 30.0–36.0)
MCV: 100.4 fL — ABNORMAL HIGH (ref 80.0–100.0)
Platelets: 604 10*3/uL — ABNORMAL HIGH (ref 150–400)
RBC: 2.31 MIL/uL — ABNORMAL LOW (ref 3.87–5.11)
RDW: 18 % — ABNORMAL HIGH (ref 11.5–15.5)
WBC: 14.9 10*3/uL — ABNORMAL HIGH (ref 4.0–10.5)
nRBC: 9.9 % — ABNORMAL HIGH (ref 0.0–0.2)

## 2020-02-14 LAB — TYPE AND SCREEN
ABO/RH(D): B POS
Antibody Screen: NEGATIVE

## 2020-02-14 MED ORDER — OXYCODONE HCL 5 MG PO CAPS: 5.0000 mg | ORAL_CAPSULE | ORAL | 0 refills | Status: DC | PRN

## 2020-02-14 MED ORDER — HYDROMORPHONE HCL 1 MG/ML IJ SOLN
1.0000 mg | Freq: Once | INTRAMUSCULAR | Status: AC
  Administered 2020-02-14: 16:00:00 1 mg via INTRAVENOUS
  Filled 2020-02-14: qty 1

## 2020-02-14 MED ORDER — LACTATED RINGERS IV BOLUS
1000.0000 mL | Freq: Once | INTRAVENOUS | Status: AC
  Administered 2020-02-14: 14:00:00 1000 mL via INTRAVENOUS

## 2020-02-14 MED ORDER — METOCLOPRAMIDE HCL 10 MG PO TABS: 10.0000 mg | ORAL_TABLET | Freq: Four times a day (QID) | ORAL | 1 refills | Status: DC

## 2020-02-14 MED ORDER — HYDROMORPHONE HCL 1 MG/ML IJ SOLN
1.0000 mg | Freq: Once | INTRAMUSCULAR | Status: AC
  Administered 2020-02-14: 15:00:00 1 mg via INTRAVENOUS
  Filled 2020-02-14: qty 1

## 2020-02-14 MED ORDER — LACTATED RINGERS IV SOLN: INTRAVENOUS | Status: DC

## 2020-02-14 MED ORDER — OXYCODONE HCL 5 MG PO TABS
5.0000 mg | ORAL_TABLET | ORAL | Status: DC | PRN
  Administered 2020-02-14: 14:00:00 5 mg via ORAL
  Filled 2020-02-14: qty 1

## 2020-02-14 NOTE — Telephone Encounter (Signed)
Patient is "23 weeks" pregnant and wanted to come to the day hospital for sickle cell pain. Per provider, in view of the pregnancy status, patient should go to the [facility] center at [place]; maternity admission unit. Patient verbalized understanding.

## 2020-02-14 NOTE — Discharge Instructions (Signed)

## 2020-02-14 NOTE — MAU Provider Note (Addendum)
Patient Krista [name] is a 34 y.o. [phone] at [redacted]w[redacted]d here with complaints of pain and fears that she is in sickle cell crisis. She denies vaginal bleeding, decreased fetal movements, vaginal discharge, nausea, vomiting, dysuria, contractions or LOF. She was recently diagnosed with PIA-1; she denies any problems with her blood pressure, blood sugar, preterm delivery or other ob-gyn complaints. She is a patient at [facility] and came to MAU after her OV today.   She takes Lovenox and aspirin daily for her PIA-1.  History     CSN: [id number]  Arrival date and time: 02/14/20 1224   First Provider Initiated Contact with Patient 02/14/20 1326      No chief complaint on file.  HPI Patient reports that she has significant pain in her lower extremities and elbows. It started yesterday; it is constant.  She reports that her activity level is the same recently; she just never knows "what causes it". She feels like she stays hydrated.  She denies fever, chest pain, no SOB. She tried Tylenol at home this morning around 0800 and then went to her appt at Dr. Lynnette Caffey office at 1040am. Dr. Lynnette Caffey suggested that she come to MAU monitoring and treatment after she reported her symptoms to Dr. Lynnette Caffey. [facility] clinic would not see her as she was [redacted] weeks pregnant).   OB History    Gravida  5   Para  1   Term  1   Preterm      AB  3   Living  1     SAB  3   TAB      Ectopic      Multiple  0   Live Births  1           Past Medical History:  Diagnosis Date  . Chronic pain    with sickle cell   . Eczema   . Heart murmur    07-13-2019  per pt since childhood,  has not ever had a echo done ,  asymptomatic  . History of blood transfusion    x3   . Missed ab   . Sickle cell anemia ([place])    followed by pcp---  last crisis 12-14-2018  . Vitamin D deficiency   . Wears glasses     Past Surgical History:  Procedure Laterality Date  . EYE SURGERY Left 11/12/2019  . LAPAROSCOPIC  CHOLECYSTECTOMY  1997    Family History  Problem Relation Age of Onset  . Diabetes Father   . Hypertension Father   . Heart disease Brother   . Cancer Maternal Grandmother   . Heart disease Brother   . Breast cancer Paternal Aunt   . Cancer Paternal Uncle     Social History   Tobacco Use  . Smoking status: Never Smoker  . Smokeless tobacco: Never Used  Substance Use Topics  . Alcohol use: Not Currently    Comment: socially  . Drug use: Never    Allergies:  Allergies  Allergen Reactions  . Keflex [Cephalexin] Swelling    Face swelling    Medications Prior to Admission  Medication Sig Dispense Refill Last Dose  . aspirin 81 MG EC tablet aspirin 81 mg tablet,delayed release   02/14/2020 at Unknown time  . Bromfenac Sodium (PROLENSA) 0.07 % SOLN Place 1 drop into the left eye 4 (four) times daily. 6 mL 3 02/13/2020 at Unknown time  . enoxaparin (LOVENOX) 40 MG/0.4ML injection enoxaparin 40 mg/0.4 mL subcutaneous syringe  02/14/2020 at Unknown time  . Prenatal Vit-Fe Fumarate-FA (PRENATAL MULTIVITAMIN) TABS tablet Take 1 tablet by mouth daily at 12 noon.   02/14/2020 at Unknown time  . Cholecalciferol (VITAMIN D3) 25 MCG (1000 UT) CAPS TAKE 1 CAPSULE BY MOUTH ONCE DAILY (Patient not taking: Reported on 12/25/2019) 100 capsule 11   . folic acid (FOLVITE) 1 MG tablet Take 1 tablet (1 mg total) by mouth daily. (Patient taking differently: Take 4 mg by mouth daily. ) 90 tablet 3   . ibuprofen (ADVIL) 600 MG tablet Take 1 tablet (600 mg total) by mouth every 8 (eight) hours as needed. 30 tablet 0   . mometasone (ELOCON) 0.1 % cream Apply 1 application topically as needed. 45 g 2 More than a month at Unknown time  . oxycodone (OXY-IR) 5 MG capsule Take 1 capsule (5 mg total) by mouth every 4 (four) hours as needed. 20 capsule 0 More than a month at Unknown time    Review of Systems  Constitutional: Negative.   HENT: Negative.   Musculoskeletal:       Reports pain in her joints and  hips  Neurological: Negative.    Physical Exam   Blood pressure 123/61, pulse 99, temperature 98.3 F (36.8 C), temperature source Oral, resp. rate 16, height 5\' 7"  (1.702 m), weight 74.7 kg, last menstrual period 08/09/2019, SpO2 100 %, not currently breastfeeding.  Physical Exam  Constitutional: She appears well-developed and well-nourished.  HENT:  Head: Normocephalic.  Respiratory: Effort normal. No respiratory distress. She has no wheezes. She has no rales.  GI: Soft. She exhibits no distension. There is no abdominal tenderness. There is no rebound.  Musculoskeletal:        General: Normal range of motion.     Cervical back: Normal range of motion.  Neurological: She is alert.  Skin: Skin is warm.    MAU Course  Procedures  MDM -NST attempted but because of strong fetal movement, was not able to trace baby.  Bedside US shows active baby with strong fetal movements. Low index of suspicion for fetal distress as patient is stable and she feels strong fetal movements.     Pt informed that the ultrasound is considered a limited OB ultrasound and is not intended to be a complete ultrasound exam.  Patient also informed that the ultrasound is not being completed with the intent of assessing for fetal or placental anomalies or any pelvic abnormalities.  Explained that the purpose of today's ultrasound is to assess for  viability.  Patient acknowledges the purpose of the exam and the limitations of the study.    -Will order IV dilaudid as oxycodone did not improve pain.  -1500: IV dilaudid given 1546: 2nd dose of IV dilaudid ordered as patient feels little relief> patient feels better now and declines any further treatment or admission.   -Patient continues to deny chest pain, fever. Hgb is 7.9, which is slightly low for her but has been lower in the past and was not transfused.  White count normal. Due to lack of chest pain, no Chest X-ray was done.   Dr. Lynnette Caffey called unit and was  updated on patient's condition and medicines. Assessment and Plan   1. Sickle cell anemia with pain (HCC)    -Patient stable for discharge after two doses of dilaudid; emphasized that patient to make follow-up appt at sickle cell clinic for pain management and to discuss her Hgb. She should continue to take her oxycodone as necessary; continue  to take folic acid and constipation prevention measures.   -Patient verbalized understanding of plan of care; return to MAU if her condition worsens or changes; second trimester precautions given.  -Keep follow up ob appt as scheduled with [facility]; note routed to [facility].    Mervyn Skeeters Aizik Reh 02/14/2020, 1:40 PM

## 2020-02-14 NOTE — MAU Note (Signed)
Having sickle cell crisis and cramping in lower back and abd.  Started yesterday afternoon.

## 2020-02-19 ENCOUNTER — Encounter: Payer: Self-pay | Admitting: Family Medicine

## 2020-02-19 ENCOUNTER — Other Ambulatory Visit: Payer: Self-pay

## 2020-02-19 ENCOUNTER — Ambulatory Visit (INDEPENDENT_AMBULATORY_CARE_PROVIDER_SITE_OTHER): Payer: BC Managed Care – PPO | Admitting: Family Medicine

## 2020-02-19 VITALS — BP 105/57 | HR 73 | Ht 67.0 in | Wt 164.0 lb

## 2020-02-19 DIAGNOSIS — Z3493 Encounter for supervision of normal pregnancy, unspecified, third trimester: Secondary | ICD-10-CM | POA: Diagnosis not present

## 2020-02-19 DIAGNOSIS — D571 Sickle-cell disease without crisis: Secondary | ICD-10-CM | POA: Diagnosis not present

## 2020-02-19 DIAGNOSIS — E559 Vitamin D deficiency, unspecified: Secondary | ICD-10-CM | POA: Diagnosis not present

## 2020-02-19 NOTE — Progress Notes (Signed)
Established Patient Office Visit  Subjective:  Patient ID: Krista [name], female    DOB: July 13, 1986  Age: 34 y.o. MRN: [id number]  CC:  Chief Complaint  Patient presents with  . Follow-up    6 month follow up,     HPI Krista [name] is a 34 year old female with a medical history significant for well-controlled sickle cell disease, vitamin D deficiency, and sickle cell retinopathy presents for a 16-month follow-up.  TR is currently in third trimester pregnancy.  She is around [redacted] weeks pregnant.  Patient is followed by Dr[facility] Linda Hedges[facility].  TR continues to be followed closely due to high risk pregnancy.  Patient has experienced several miscarriages over the years.  Patient recently was advised for evaluation by maternity admissions unit for increased cramping, sickle cell related pain, and general feeling of being unwell.  Patient was treated and evaluated and followed by obstetrician overnight.  She was discharged in a stable condition.  TR continues to have fairly well-controlled sickle cell disease.  She has infrequent pain crises.  Throughout pregnancy, hemoglobin has been maintained at 8.0-9.0 g/dL.  She continues to hydrate consistently, take prenatal vitamins and folic acid.  Patient says that she is getting lots of rest.  She also denies blurred vision, headache, chest pain, urinary symptoms, nausea, vomiting, or diarrhea.  Past Medical History:  Diagnosis Date  . Chronic pain    with sickle cell   . Eczema   . Heart murmur    07-13-2019  per pt since childhood,  has not ever had a echo done ,  asymptomatic  . History of blood transfusion    x3   . Missed ab   . Sickle cell anemia ([place])    followed by pcp---  last crisis 12-14-2018  . Vitamin D deficiency   . Wears glasses     Past Surgical History:  Procedure Laterality Date  . EYE SURGERY Left 11/12/2019  . LAPAROSCOPIC CHOLECYSTECTOMY  1997    Family History  Problem Relation Age of  Onset  . Diabetes Father   . Hypertension Father   . Heart disease Brother   . Cancer Maternal Grandmother   . Heart disease Brother   . Breast cancer Paternal Aunt   . Cancer Paternal Uncle     Social History   Socioeconomic History  . Marital status: Married    Spouse name: n/a  . Number of children: 0  . Years of education: 16+  . Highest education level: Not on file  Occupational History  . Occupation: Product manager: [employer]  Tobacco Use  . Smoking status: Never Smoker  . Smokeless tobacco: Never Used  Substance and Sexual Activity  . Alcohol use: Not Currently    Comment: socially  . Drug use: Never  . Sexual activity: Yes    Birth control/protection: None  Other Topics Concern  . Not on file  Social History Narrative   ** Merged History Encounter **       Lives alone.  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   Social Determinants of Health   Financial Resource Strain:   . Difficulty of Paying Living Expenses:   Food Insecurity:   . Worried About Charity fundraiser in the Last Year:   . Arboriculturist in the Last Year:   Transportation Needs:   . Film/video editor (Medical):   Marland Kitchen  Lack of Transportation (Non-Medical):   Physical Activity:   . Days of Exercise per Week:   . Minutes of Exercise per Session:   Stress:   . Feeling of Stress :   Social Connections:   . Frequency of Communication with Friends and Family:   . Frequency of Social Gatherings with Friends and Family:   . Attends Religious Services:   . Active Member of Clubs or Organizations:   . Attends Archivist Meetings:   Marland Kitchen Marital Status:   Intimate Partner Violence:   . Fear of Current or Ex-Partner:   . Emotionally Abused:   Marland Kitchen Physically Abused:   . Sexually Abused:     Outpatient Medications Prior to Visit  Medication Sig Dispense Refill  . aspirin 81 MG EC tablet  aspirin 81 mg tablet,delayed release    . Prenatal Vit-Fe Fumarate-FA (PRENATAL MULTIVITAMIN) TABS tablet Take 1 tablet by mouth daily at 12 noon.    . Bromfenac Sodium (PROLENSA) 0.07 % SOLN Place 1 drop into the left eye 4 (four) times daily. (Patient not taking: Reported on 02/19/2020) 6 mL 3  . Cholecalciferol (VITAMIN D3) 25 MCG (1000 UT) CAPS TAKE 1 CAPSULE BY MOUTH ONCE DAILY (Patient not taking: Reported on 12/25/2019) 100 capsule 11  . enoxaparin (LOVENOX) 40 MG/0.4ML injection enoxaparin 40 mg/0.4 mL subcutaneous syringe    . folic acid (FOLVITE) 1 MG tablet Take 1 tablet (1 mg total) by mouth daily. (Patient not taking: Reported on 02/19/2020) 90 tablet 3  . ibuprofen (ADVIL) 600 MG tablet Take 1 tablet (600 mg total) by mouth every 8 (eight) hours as needed. (Patient not taking: Reported on 02/19/2020) 30 tablet 0  . metoCLOPramide (REGLAN) 10 MG tablet Take 1 tablet (10 mg total) by mouth every 6 (six) hours. (Patient not taking: Reported on 02/19/2020) 30 tablet 1  . mometasone (ELOCON) 0.1 % cream Apply 1 application topically as needed. (Patient not taking: Reported on 02/19/2020) 45 g 2  . oxycodone (OXY-IR) 5 MG capsule Take 1 capsule (5 mg total) by mouth every 4 (four) hours as needed. (Patient not taking: Reported on 02/19/2020) 28 capsule 0   No facility-administered medications prior to visit.    Allergies  Allergen Reactions  . Keflex [Cephalexin] Swelling    Face swelling    ROS Review of Systems    Objective:    Physical Exam  BP (!) 105/57 (BP Location: Right Arm, Patient Position: Sitting)   Pulse 73   Ht 5\' 7"  (1.702 m)   Wt 164 lb (74.4 kg)   LMP 08/09/2019   SpO2 97%   BMI 25.69 kg/m  Wt Readings from Last 3 Encounters:  02/19/20 164 lb (74.4 kg)  02/14/20 164 lb 11.2 oz (74.7 kg)  12/25/19 161 lb (73 kg)     Health Maintenance Due  Topic Date Due  . COVID-19 Vaccine (1) Never done    There are no preventive care reminders to display for this  patient.  Lab Results  Component Value Date   TSH 2.21 03/24/2017   Lab Results  Component Value Date   WBC 14.9 (H) 02/14/2020   HGB 7.9 (L) 02/14/2020   HCT 23.2 (L) 02/14/2020   MCV 100.4 (H) 02/14/2020   PLT 604 (H) 02/14/2020   Lab Results  Component Value Date   NA 139 02/14/2020   K 3.5 02/14/2020   CO2 20 (L) 02/14/2020   GLUCOSE 114 (H) 02/14/2020   BUN <5 (L) 02/14/2020  CREATININE 0.45 02/14/2020   BILITOT 1.8 (H) 02/14/2020   ALKPHOS 58 02/14/2020   AST 24 02/14/2020   ALT 18 02/14/2020   PROT 6.3 (L) 02/14/2020   ALBUMIN 3.3 (L) 02/14/2020   CALCIUM 8.5 (L) 02/14/2020   ANIONGAP 10 02/14/2020   Lab Results  Component Value Date   CHOL 112 (L) 08/12/2015   Lab Results  Component Value Date   HDL 26 (L) 08/12/2015   Lab Results  Component Value Date   LDLCALC 67 08/12/2015   Lab Results  Component Value Date   TRIG 95 08/12/2015   Lab Results  Component Value Date   CHOLHDL 4.3 08/12/2015   Lab Results  Component Value Date   HGBA1C <4.0 03/24/2017      Assessment & Plan:   Problem List Items Addressed This Visit    None     Hb-SS disease without crisis ([place]) Sheala is doing very well and is without complaint on today.  Her sickle cell continues to be well controlled.  It is important that patient maintain hemoglobin above 8.0 g/dL throughout pregnancy.  Patient will return to clinic in 2 weeks to repeat hemoglobin.  If hemoglobin decreases dramatically during third trimester, patient may warrant simple exchange.  At that point, therapeutic phlebotomy may be beneficial.  We will continue to monitor closely and work closely with obstetrician during third trimester.  Vitamin D deficiency Will recheck vitamin D level in 6 months.  Patient encouraged to continue supplement and vitamin D fortified foods such as dairy, salmon, egg yolks etc. patient expressed understanding.  Third trimester pregnancy Patient is very excited about being in  third trimester pregnancy.  She is carrying a baby boy.  Patient advised to continue to follow-up with obstetrician closely, take prenatal vitamins and folic acid.  Hydrate with at least 32 ounces of water per day.  Also, continue a balanced diet.    Follow-up: Follow-up in 2 weeks for labs   [place], MSN, [place] [address] [place], [place] [address] [phone]

## 2020-02-21 NOTE — Progress Notes (Signed)
[facility] Note  02/25/2020     CHIEF COMPLAINT Patient presents for Retina Follow Up   HISTORY OF PRESENT ILLNESS: Krista [name] is a 34 y.o. female who presents to the clinic today for:   HPI    Retina Follow Up    Patient presents with  Other.  In left eye.  This started weeks ago.  Severity is moderate.  Duration of weeks.  Since onset it is stable.  I, the attending physician,  performed the HPI with the patient and updated documentation appropriately.          Comments    PF 2-3x day OS.  Patient states vision is stable OU.  Patient reports that she does not have anymore floaters and denies flashes of light OU.  Patient denies eye pain or discomfort.  Patient says she tries to use PF QID OS but mostly gets drop in 2-3x daily OS.       Last edited by Bernarda Caffey, MD on 02/25/2020  8:20 AM. (History)    Pt is s/p IVO #1 with Dr. Manuella Ghazi on January 08, 2020, pt states she had not noticed a difference in her vision since the injection, but states today she was able to see her right side peripheral vision on testing, pt states she did not pick up Prolensa bc it was $300, she is using PF 2-3 times a day, she states her pregnancy is going well  Referring physician: [place], [place] OD [place], [place] [id number]   HISTORICAL INFORMATION:   Selected notes from the [facility] Referral from Dr. [place] DeMarco for macular edema OS  LEE: 11.18.20, BCVA OD: 20/20 OS: 20/30+2  PMH: sickle cell disease, chronic pain, heart murmer, hx of retinal vasculitits    CURRENT MEDICATIONS: Current Outpatient Medications (Ophthalmic Drugs)  Medication Sig  . Bromfenac Sodium (PROLENSA) 0.07 % SOLN Place 1 drop into the left eye 4 (four) times daily.   No current facility-administered medications for this visit. (Ophthalmic Drugs)   Current Outpatient Medications (Other)  Medication Sig  . aspirin 81 MG EC tablet aspirin 81  mg tablet,delayed release  . Cholecalciferol (VITAMIN D3) 25 MCG (1000 UT) CAPS TAKE 1 CAPSULE BY MOUTH ONCE DAILY (Patient not taking: Reported on 12/25/2019)  . enoxaparin (LOVENOX) 40 MG/0.4ML injection enoxaparin 40 mg/0.4 mL subcutaneous syringe  . folic acid (FOLVITE) 1 MG tablet Take 1 tablet (1 mg total) by mouth daily. (Patient not taking: Reported on 02/19/2020)  . ibuprofen (ADVIL) 600 MG tablet Take 1 tablet (600 mg total) by mouth every 8 (eight) hours as needed. (Patient not taking: Reported on 02/19/2020)  . metoCLOPramide (REGLAN) 10 MG tablet Take 1 tablet (10 mg total) by mouth every 6 (six) hours. (Patient not taking: Reported on 02/19/2020)  . mometasone (ELOCON) 0.1 % cream Apply 1 application topically as needed. (Patient not taking: Reported on 02/19/2020)  . oxycodone (OXY-IR) 5 MG capsule Take 1 capsule (5 mg total) by mouth every 4 (four) hours as needed. (Patient not taking: Reported on 02/19/2020)  . Prenatal Vit-Fe Fumarate-FA (PRENATAL MULTIVITAMIN) TABS tablet Take 1 tablet by mouth daily at 12 noon.   No current facility-administered medications for this visit. (Other)      REVIEW OF SYSTEMS: ROS    Positive for: Gastrointestinal, Eyes   Negative for: Constitutional, Neurological, Skin, Genitourinary, Musculoskeletal, HENT, Endocrine, Cardiovascular, Respiratory, Psychiatric, Allergic/Imm, Heme/Lymph   Last edited by Doneen Poisson  on 02/25/2020  7:58 AM. (History)       ALLERGIES Allergies  Allergen Reactions  . Keflex [Cephalexin] Swelling    Face swelling    PAST MEDICAL HISTORY Past Medical History:  Diagnosis Date  . Chronic pain    with sickle cell   . Eczema   . Heart murmur    07-13-2019  per pt since childhood,  has not ever had a echo done ,  asymptomatic  . History of blood transfusion    x3   . Missed ab   . Sickle cell anemia ([place])    followed by pcp---  last crisis 12-14-2018  . Vitamin D deficiency   . Wears glasses    Past Surgical  History:  Procedure Laterality Date  . EYE SURGERY Left 11/12/2019  . LAPAROSCOPIC CHOLECYSTECTOMY  1997    FAMILY HISTORY Family History  Problem Relation Age of Onset  . Diabetes Father   . Hypertension Father   . Heart disease Brother   . Cancer Maternal Grandmother   . Heart disease Brother   . Breast cancer Paternal Aunt   . Cancer Paternal Uncle     SOCIAL HISTORY Social History   Tobacco Use  . Smoking status: Never Smoker  . Smokeless tobacco: Never Used  Substance Use Topics  . Alcohol use: Not Currently    Comment: socially  . Drug use: Never         OPHTHALMIC EXAM:  Base Eye Exam    Visual Acuity (Snellen - Linear)      Right Left   Dist cc 20/20 20/60 +2   Dist ph cc  20/40 -2       Tonometry (Tonopen, 8:04 AM)      Right Left   Pressure 8 14       Pupils      Dark Light Shape React APD   Right 3 2 Round Brisk 0   Left 3 2 Round Brisk 0       Visual Fields      Left Right     Full   Restrictions Partial outer superior nasal, inferior nasal deficiencies        Extraocular Movement      Right Left    Full Full       Neuro/Psych    Oriented x3: Yes   Mood/Affect: Normal       Dilation    Both eyes: 1.0% Mydriacyl, 2.5% Phenylephrine @ 8:04 AM        Slit Lamp and Fundus Exam    Slit Lamp Exam      Right Left   Lids/Lashes Normal Normal   Conjunctiva/Sclera Mild Melanosis Mild Melanosis   Cornea Clear, mild tear film debris Clear, mild tear film debris   Anterior Chamber Deep and quiet, no cell or flare Deep and quiet, 1+pigment   Iris Round and reactive Round and dilated   Lens Clear Clear   Vitreous Mild Vitreous syneresis mild Vitreous syneresis, vitreous condensations, Ozurdex pellet inferiorly       Fundus Exam      Right Left   Disc Pink and Sharp, mild PPA Pink and Sharp, 360 degrees of PPP   C/D Ratio 0.3 0.2   Macula Flat, good foveal reflex, No heme or edema Blunted foveal reflex, +Cystic changes centrally --  slightly improved, mild Epiretinal membrane, RPE mottling and clumping   Vessels Vascular attenuation, Tortuous, mild AV crossing changes Vascular attenuation, Copper wiring, mild Tortuousity  Periphery Attached, no heme    Attached, focal CR scar at 0730 equator, good PRP laser changes -- peripheral 360        Refraction    Wearing Rx      Sphere Cylinder Axis   Right -1.50 +1.25 005   Left -1.50 +0.75 006   Type: SVL          IMAGING AND PROCEDURES  Imaging and Procedures for @TODAY @  OCT, Retina - OU - Both Eyes       Right Eye Quality was good. Central Foveal Thickness: 207. Progression has been stable. Findings include normal foveal contour, no IRF, no SRF (Broad foveal depression).   Left Eye Quality was good. Central Foveal Thickness: 246. Progression has improved. Findings include intraretinal fluid, epiretinal membrane, outer retinal atrophy, abnormal foveal contour, no SRF (Interval improvement in IRF and foveal profile, interval release of VMA, interval decrease of central ellipsoid signal).   Notes *Images captured and stored on drive  Diagnosis / Impression: OD: NFP, no IRF/SRF [id number] improvement in IRF and foveal profile, interval release of VMA, interval decrease of central ellipsoid signal    See below  Abbreviations: NFP - Normal foveal profile. CME - cystoid macular edema. PED - pigment epithelial detachment. IRF - intraretinal fluid. SRF - subretinal fluid. EZ - ellipsoid zone. ERM - epiretinal membrane. ORA - outer retinal atrophy. ORT - outer retinal tubulation. 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 Retinal edema  H35.81 OCT, Retina - OU - Both Eyes  4. Hypertensive retinopathy of both eyes  H35.033    1,2. Peripheral chorioretinal inflammation and vasculitis  OS  - pt reports vague history "vasculitis" OS -- first diagnosed in [place] in 2016 -- doesn't think she saw a retina specialist  - states in 2016 had an episode of seeing spots then vision loss OS, told she had "vasculitis"  - +vitreous cell  - exam shows peripapillary atrophy and pigment deposition  - OCT with diffuse peripheral ellipsoid loss with central sparing + central CME slightly increased today  - FA (12.02.20) shows mild, late perivascular leakage, peripheral midzone OS (very late appearing--likely increased signal if study were allowed to continue beyond 10 min); OD normal study  - BCVA slightly decreased to 20/40 from 20/30 OS, but 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 Matilde Bash, Uveitis/Retina expert, who initiated extensive work up and advised laser PRP  - of note, pt is pregnant  - discussed today's findings with patient             - s/p PRP OS (01.25.21) -- good laser changes 360  - s/p IVO #1 (Dr. Manuella Ghazi, 03.23.21)  - cont PF and Prolensa qid OS  - f/u here in 6 months  - has f/u appt with Dr. Manuella Ghazi on Mar 07, 2020  3. Retinal edema  - OCT shows interval improvement in central CME/IRF, but FA has no corresponding central hyperfluorescent leakage/staining--petaloid or otherwise  4. History of Sickle Cell SS disease  - no SS retinopathy  Ophthalmic Meds Ordered this visit:  Meds ordered this encounter  Medications  . Bromfenac Sodium (PROLENSA) 0.07 % SOLN    Sig:  Place 1 drop into the left eye 4 (four) times daily.    Dispense:  6 mL    Refill:  3       Return in about 6 months (around 08/27/2020) for f/u peripheral chorioretinal inflammation OS, DFE, OCT.  There are no Patient Instructions on file for this visit.   Explained the diagnoses, plan, and follow up with the patient and they expressed understanding.  Patient expressed understanding of the importance of proper follow up care.   This  document serves as a record of services personally performed by Gardiner Sleeper, MD, PhD. It was created on their behalf by Leeann Must, [place], a certified ophthalmic assistant. The creation of this record is the provider's dictation and/or activities during the visit.    Electronically signed by: Leeann Must, COA @TODAY @ 8:36 AM   This document serves as a record of services personally performed by Gardiner Sleeper, MD, PhD. It was created on their behalf by Ernest Mallick, OA, an ophthalmic assistant. The creation of this record is the provider's dictation and/or activities during the visit.    Electronically signed by: Ernest Mallick, OA 05.10.2021 8:36 AM   Gardiner Sleeper, M.D., Ph.D. Diseases & Surgery of the Retina and Vitreous [facility]  I have reviewed the above documentation for accuracy and completeness, and I agree with the above. Gardiner Sleeper, M.D., Ph.D. 02/25/20 8:36 AM   Abbreviations: M myopia (nearsighted); A astigmatism; H hyperopia (farsighted); P presbyopia; Mrx spectacle prescription;  CTL contact lenses; OD right eye; OS left eye; OU both eyes  XT exotropia; ET esotropia; PEK punctate epithelial keratitis; PEE punctate epithelial erosions; DES dry eye syndrome; MGD meibomian gland dysfunction; ATs artificial tears; PFAT's preservative free artificial tears; [place] nuclear sclerotic cataract; PSC posterior subcapsular cataract; ERM epi-retinal membrane; PVD posterior vitreous detachment; RD retinal detachment; DM diabetes mellitus; DR diabetic retinopathy; NPDR non-proliferative diabetic retinopathy; PDR proliferative diabetic retinopathy; CSME clinically significant macular edema; DME diabetic macular edema; dbh dot blot hemorrhages; CWS cotton wool spot; POAG primary open angle glaucoma; C/D cup-to-disc ratio; HVF humphrey visual field; GVF goldmann visual field; OCT optical coherence tomography; IOP intraocular pressure; BRVO Branch retinal vein  occlusion; CRVO central retinal vein occlusion; CRAO central retinal artery occlusion; BRAO branch retinal artery occlusion; RT retinal tear; SB scleral buckle; PPV pars plana vitrectomy; VH Vitreous hemorrhage; PRP panretinal laser photocoagulation; IVK intravitreal kenalog; VMT vitreomacular traction; MH Macular hole;  NVD neovascularization of the disc; NVE neovascularization elsewhere; AREDS age related eye disease study; ARMD age related macular degeneration; POAG primary open angle glaucoma; EBMD epithelial/anterior basement membrane dystrophy; ACIOL anterior chamber intraocular lens; IOL intraocular lens; PCIOL posterior chamber intraocular lens; Phaco/IOL phacoemulsification with intraocular lens placement; [place] photorefractive keratectomy; LASIK laser assisted in situ keratomileusis; HTN hypertension; DM diabetes mellitus; COPD chronic obstructive pulmonary disease

## 2020-02-25 ENCOUNTER — Other Ambulatory Visit: Payer: Self-pay

## 2020-02-25 ENCOUNTER — Encounter (INDEPENDENT_AMBULATORY_CARE_PROVIDER_SITE_OTHER): Payer: Self-pay | Admitting: Ophthalmology

## 2020-02-25 ENCOUNTER — Ambulatory Visit (INDEPENDENT_AMBULATORY_CARE_PROVIDER_SITE_OTHER): Payer: BC Managed Care – PPO | Admitting: Ophthalmology

## 2020-02-25 ENCOUNTER — Other Ambulatory Visit (INDEPENDENT_AMBULATORY_CARE_PROVIDER_SITE_OTHER): Payer: Self-pay

## 2020-02-25 DIAGNOSIS — H35033 Hypertensive retinopathy, bilateral: Secondary | ICD-10-CM | POA: Diagnosis not present

## 2020-02-25 DIAGNOSIS — H35062 Retinal vasculitis, left eye: Secondary | ICD-10-CM

## 2020-02-25 DIAGNOSIS — H3581 Retinal edema: Secondary | ICD-10-CM | POA: Diagnosis not present

## 2020-02-25 DIAGNOSIS — H30032 Focal chorioretinal inflammation, peripheral, left eye: Secondary | ICD-10-CM

## 2020-02-25 MED ORDER — KETOROLAC TROMETHAMINE 0.5 % OP SOLN: 1.0000 [drp] | Freq: Four times a day (QID) | OPHTHALMIC | 0 refills | Status: DC

## 2020-02-25 MED ORDER — PROLENSA 0.07 % OP SOLN: 1.0000 [drp] | Freq: Four times a day (QID) | OPHTHALMIC | 3 refills | Status: DC

## 2020-02-26 ENCOUNTER — Other Ambulatory Visit: Payer: BC Managed Care – PPO

## 2020-02-26 DIAGNOSIS — E559 Vitamin D deficiency, unspecified: Secondary | ICD-10-CM

## 2020-02-27 LAB — CBC WITH DIFFERENTIAL/PLATELET
Basophils Absolute: 0.1 10*3/uL (ref 0.0–0.2)
Basos: 1 %
EOS (ABSOLUTE): 0 10*3/uL (ref 0.0–0.4)
Eos: 0 %
Hematocrit: 24.3 % — ABNORMAL LOW (ref 34.0–46.6)
Hemoglobin: 8.6 g/dL — ABNORMAL LOW (ref 11.1–15.9)
Immature Grans (Abs): 0.2 10*3/uL — ABNORMAL HIGH (ref 0.0–0.1)
Immature Granulocytes: 1 %
Lymphocytes Absolute: 3.7 10*3/uL — ABNORMAL HIGH (ref 0.7–3.1)
Lymphs: 25 %
MCH: 34 pg — ABNORMAL HIGH (ref 26.6–33.0)
MCHC: 35.4 g/dL (ref 31.5–35.7)
MCV: 96 fL (ref 79–97)
Monocytes Absolute: 1 10*3/uL — ABNORMAL HIGH (ref 0.1–0.9)
Monocytes: 7 %
NRBC: 8 % — ABNORMAL HIGH (ref 0–0)
Neutrophils Absolute: 9.5 10*3/uL — ABNORMAL HIGH (ref 1.4–7.0)
Neutrophils: 66 %
Platelets: 703 10*3/uL — ABNORMAL HIGH (ref 150–450)
RBC: 2.53 x10E6/uL — CL (ref 3.77–5.28)
RDW: 14.5 % (ref 11.7–15.4)
WBC: 14.5 10*3/uL — ABNORMAL HIGH (ref 3.4–10.8)

## 2020-02-28 ENCOUNTER — Telehealth: Payer: Self-pay

## 2020-02-28 NOTE — Telephone Encounter (Signed)
-----   Message from Dorena Dew, [place] sent at 02/27/2020  2:11 PM EDT ----- Regarding: lab results Please inform patient that hemoglobin has returned to her baseline at 8.6. Goal is to maintain hemoglobin above 8. Will recheck hemoglobin in 6 weeks. Please schedule a lab appointment.    [name]  APRN, MSN, FNP-C Patient [facility] [address] [place], [place] [address] [phone]

## 2020-02-28 NOTE — Telephone Encounter (Signed)
Called notified patient of lab results , pt understood results ,set up an appt for patient come in 6 weeks for lab appt.

## 2020-03-13 ENCOUNTER — Other Ambulatory Visit: Payer: Self-pay | Admitting: Family Medicine

## 2020-03-17 ENCOUNTER — Other Ambulatory Visit (INDEPENDENT_AMBULATORY_CARE_PROVIDER_SITE_OTHER): Payer: Self-pay | Admitting: Ophthalmology

## 2020-04-10 ENCOUNTER — Other Ambulatory Visit: Payer: BC Managed Care – PPO

## 2020-05-13 LAB — OB RESULTS CONSOLE GBS: GBS: NEGATIVE

## 2020-05-20 ENCOUNTER — Encounter (HOSPITAL_COMMUNITY): Payer: Self-pay | Admitting: *Deleted

## 2020-05-20 ENCOUNTER — Telehealth (HOSPITAL_COMMUNITY): Payer: Self-pay | Admitting: *Deleted

## 2020-05-20 NOTE — Telephone Encounter (Signed)
Preadmission screen  

## 2020-06-02 ENCOUNTER — Other Ambulatory Visit (HOSPITAL_COMMUNITY)
Admission: RE | Admit: 2020-06-02 | Discharge: 2020-06-02 | Disposition: A | Discharge status: Home / Self Care | Payer: BC Managed Care – PPO | Source: Ambulatory Visit | Attending: Obstetrics and Gynecology | Admitting: Obstetrics and Gynecology

## 2020-06-02 DIAGNOSIS — Z01812 Encounter for preprocedural laboratory examination: Secondary | ICD-10-CM | POA: Insufficient documentation

## 2020-06-02 DIAGNOSIS — Z20822 Contact with and (suspected) exposure to covid-19: Secondary | ICD-10-CM | POA: Insufficient documentation

## 2020-06-02 LAB — SARS CORONAVIRUS 2 (TAT 6-24 HRS): SARS Coronavirus 2: NEGATIVE

## 2020-06-02 NOTE — H&P (Signed)
Krista [name] is a 34 y.o. female presenting for IOL at term. Pregnancy complicated by [facility] Dx in patient. Partner tests negative. 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 U/S 05/28/20 EFW 7# 5oz, vtx. OB History    Gravida  5   Para  1   Term  1   Preterm      AB  3   Living  1     SAB  3   TAB      Ectopic      Multiple  0   Live Births  1          Past Medical History:  Diagnosis Date  . Chronic pain    with sickle cell   . Eczema   . Heart murmur    07-13-2019  per pt since childhood,  has not ever had a echo done ,  asymptomatic  . History of blood transfusion    x3   . Missed ab   . Sickle cell anemia ([place])    followed by pcp---  last crisis 12-14-2018  . Vitamin D deficiency   . Wears glasses    Past Surgical History:  Procedure Laterality Date  . EYE SURGERY Left 11/12/2019  . LAPAROSCOPIC CHOLECYSTECTOMY  1997   Family History: family history includes Breast cancer in her paternal aunt; Cancer in her maternal grandmother and paternal uncle; Diabetes in her father; Heart disease in her brother and brother; Hypertension in her father. Social History:  reports that she has never smoked. She has never used smokeless tobacco. She reports previous alcohol use. She reports that she does not use drugs.     Maternal Diabetes: No Genetic Screening: Normal Maternal Ultrasounds/Referrals: Normal Fetal Ultrasounds or other Referrals:  None Maternal Substance Abuse:  No Significant Maternal Medications:  None Significant Maternal Lab Results:  Group B Strep negative Other Comments:  None  Review of Systems  Constitutional: Negative for fever.  Eyes: Negative for visual disturbance.  Gastrointestinal: Negative for abdominal pain.  Neurological: Negative for headaches.   Maternal Medical History:  Fetal activity: Perceived fetal activity is  normal.        Last menstrual period 08/09/2019. Maternal Exam:  Abdomen: Fetal presentation: vertex     Physical Exam Cardiovascular:     Rate and Rhythm: Normal rate.  Pulmonary:     Effort: Pulmonary effort is normal.     Cx 2/80/-2  On 05/28/20  Prenatal labs: ABO, Rh: --/--/B POS (04/29 1400) Antibody: NEG (04/29 1400) Rubella: Immune (02/03 0000) RPR: Nonreactive (02/03 0000)  HBsAg: Negative (02/03 0000)  HIV: Non-reactive (02/03 0000)  GBS: Negative/-- (07/27 0000)   Assessment/Plan: 34 yo G5P1 @ 39 1/7 wks for IOL   Shon Millet II 06/02/2020, 6:45 PM

## 2020-06-03 ENCOUNTER — Encounter (HOSPITAL_COMMUNITY): Payer: Self-pay | Admitting: Obstetrics and Gynecology

## 2020-06-03 ENCOUNTER — Inpatient Hospital Stay (HOSPITAL_COMMUNITY): Payer: BC Managed Care – PPO | Admitting: Anesthesiology

## 2020-06-03 ENCOUNTER — Inpatient Hospital Stay (HOSPITAL_COMMUNITY)
Admission: AD | Admit: 2020-06-03 | Discharge: 2020-06-05 | DRG: 807 | Disposition: A | Discharge status: Home / Self Care | Payer: BC Managed Care – PPO | Attending: Obstetrics and Gynecology | Admitting: Obstetrics and Gynecology

## 2020-06-03 ENCOUNTER — Inpatient Hospital Stay (HOSPITAL_COMMUNITY): Payer: BC Managed Care – PPO

## 2020-06-03 DIAGNOSIS — D571 Sickle-cell disease without crisis: Secondary | ICD-10-CM | POA: Diagnosis present

## 2020-06-03 DIAGNOSIS — Z3A39 39 weeks gestation of pregnancy: Secondary | ICD-10-CM

## 2020-06-03 DIAGNOSIS — O9902 Anemia complicating childbirth: Principal | ICD-10-CM | POA: Diagnosis present

## 2020-06-03 DIAGNOSIS — O26893 Other specified pregnancy related conditions, third trimester: Secondary | ICD-10-CM | POA: Diagnosis present

## 2020-06-03 DIAGNOSIS — Z20822 Contact with and (suspected) exposure to covid-19: Secondary | ICD-10-CM | POA: Diagnosis present

## 2020-06-03 DIAGNOSIS — Z3490 Encounter for supervision of normal pregnancy, unspecified, unspecified trimester: Secondary | ICD-10-CM

## 2020-06-03 LAB — CBC
HCT: 23.5 % — ABNORMAL LOW (ref 36.0–46.0)
Hemoglobin: 7.7 g/dL — ABNORMAL LOW (ref 12.0–15.0)
MCH: 33.6 pg (ref 26.0–34.0)
MCHC: 32.8 g/dL (ref 30.0–36.0)
MCV: 102.6 fL — ABNORMAL HIGH (ref 80.0–100.0)
Platelets: 670 10*3/uL — ABNORMAL HIGH (ref 150–400)
RBC: 2.29 MIL/uL — ABNORMAL LOW (ref 3.87–5.11)
RDW: 19.3 % — ABNORMAL HIGH (ref 11.5–15.5)
WBC: 15.4 10*3/uL — ABNORMAL HIGH (ref 4.0–10.5)
nRBC: 18.1 % — ABNORMAL HIGH (ref 0.0–0.2)

## 2020-06-03 LAB — FIBRINOGEN: Fibrinogen: 554 mg/dL — ABNORMAL HIGH (ref 210–475)

## 2020-06-03 LAB — PROTIME-INR
INR: 1.1 (ref 0.8–1.2)
Prothrombin Time: 13.6 seconds (ref 11.4–15.2)

## 2020-06-03 LAB — SAVE SMEAR(SSMR), FOR PROVIDER SLIDE REVIEW

## 2020-06-03 LAB — APTT: aPTT: 31 seconds (ref 24–36)

## 2020-06-03 LAB — TYPE AND SCREEN
ABO/RH(D): B POS
Antibody Screen: NEGATIVE

## 2020-06-03 MED ORDER — EPHEDRINE 5 MG/ML INJ: 10.0000 mg | INTRAVENOUS | Status: DC | PRN

## 2020-06-03 MED ORDER — PREDNISOLONE ACETATE 1 % OP SUSP
1.0000 [drp] | Freq: Four times a day (QID) | OPHTHALMIC | Status: DC
  Administered 2020-06-04 – 2020-06-05 (×5): 1 [drp] via OPHTHALMIC
  Filled 2020-06-03: qty 5

## 2020-06-03 MED ORDER — SENNOSIDES-DOCUSATE SODIUM 8.6-50 MG PO TABS
2.0000 | ORAL_TABLET | ORAL | Status: DC
  Administered 2020-06-04 – 2020-06-05 (×2): 2 via ORAL
  Filled 2020-06-03 (×2): qty 2

## 2020-06-03 MED ORDER — FENTANYL-BUPIVACAINE-NACL 0.5-0.125-0.9 MG/250ML-% EP SOLN
12.0000 mL/h | EPIDURAL | Status: DC | PRN
  Filled 2020-06-03: qty 250

## 2020-06-03 MED ORDER — LIDOCAINE HCL (PF) 1 % IJ SOLN
30.0000 mL | INTRAMUSCULAR | Status: AC | PRN
  Administered 2020-06-03: 30 mL via SUBCUTANEOUS
  Filled 2020-06-03: qty 30

## 2020-06-03 MED ORDER — DIPHENHYDRAMINE HCL 25 MG PO CAPS: 25.0000 mg | ORAL_CAPSULE | Freq: Four times a day (QID) | ORAL | Status: DC | PRN

## 2020-06-03 MED ORDER — PRENATAL MULTIVITAMIN CH
1.0000 | ORAL_TABLET | Freq: Every day | ORAL | Status: DC
  Administered 2020-06-04 – 2020-06-05 (×2): 1 via ORAL
  Filled 2020-06-03 (×2): qty 1

## 2020-06-03 MED ORDER — DIBUCAINE (PERIANAL) 1 % EX OINT: 1.0000 "application " | TOPICAL_OINTMENT | CUTANEOUS | Status: DC | PRN

## 2020-06-03 MED ORDER — OXYCODONE HCL 5 MG PO TABS: 5.0000 mg | ORAL_TABLET | ORAL | Status: DC | PRN

## 2020-06-03 MED ORDER — ONDANSETRON HCL 4 MG/2ML IJ SOLN: 4.0000 mg | Freq: Four times a day (QID) | INTRAMUSCULAR | Status: DC | PRN

## 2020-06-03 MED ORDER — FLEET ENEMA 7-19 GM/118ML RE ENEM: 1.0000 | ENEMA | RECTAL | Status: DC | PRN

## 2020-06-03 MED ORDER — PHENYLEPHRINE 40 MCG/ML (10ML) SYRINGE FOR IV PUSH (FOR BLOOD PRESSURE SUPPORT): 80.0000 ug | PREFILLED_SYRINGE | INTRAVENOUS | Status: DC | PRN

## 2020-06-03 MED ORDER — OXYTOCIN-SODIUM CHLORIDE 30-0.9 UT/500ML-% IV SOLN
1.0000 m[IU]/min | INTRAVENOUS | Status: DC
  Administered 2020-06-03: 2 m[IU]/min via INTRAVENOUS

## 2020-06-03 MED ORDER — FENTANYL CITRATE (PF) 100 MCG/2ML IJ SOLN: 50.0000 ug | INTRAMUSCULAR | Status: DC | PRN

## 2020-06-03 MED ORDER — OXYTOCIN BOLUS FROM INFUSION
333.0000 mL | Freq: Once | INTRAVENOUS | Status: AC
  Administered 2020-06-03: 333 mL via INTRAVENOUS

## 2020-06-03 MED ORDER — ONDANSETRON HCL 4 MG/2ML IJ SOLN: 4.0000 mg | INTRAMUSCULAR | Status: DC | PRN

## 2020-06-03 MED ORDER — ACETAMINOPHEN 325 MG PO TABS: 650.0000 mg | ORAL_TABLET | ORAL | Status: DC | PRN

## 2020-06-03 MED ORDER — KETOROLAC TROMETHAMINE 0.5 % OP SOLN
1.0000 [drp] | Freq: Four times a day (QID) | OPHTHALMIC | Status: DC
  Administered 2020-06-04 – 2020-06-05 (×5): 1 [drp] via OPHTHALMIC
  Filled 2020-06-03: qty 5

## 2020-06-03 MED ORDER — ACETAMINOPHEN 325 MG PO TABS
650.0000 mg | ORAL_TABLET | ORAL | Status: DC | PRN
  Administered 2020-06-03 – 2020-06-05 (×6): 650 mg via ORAL
  Filled 2020-06-03 (×6): qty 2

## 2020-06-03 MED ORDER — WITCH HAZEL-GLYCERIN EX PADS: 1.0000 "application " | MEDICATED_PAD | CUTANEOUS | Status: DC | PRN

## 2020-06-03 MED ORDER — BENZOCAINE-MENTHOL 20-0.5 % EX AERO
1.0000 "application " | INHALATION_SPRAY | CUTANEOUS | Status: DC | PRN
  Administered 2020-06-03: 1 via TOPICAL
  Filled 2020-06-03: qty 56

## 2020-06-03 MED ORDER — ZOLPIDEM TARTRATE 5 MG PO TABS: 5.0000 mg | ORAL_TABLET | Freq: Every evening | ORAL | Status: DC | PRN

## 2020-06-03 MED ORDER — OXYCODONE-ACETAMINOPHEN 5-325 MG PO TABS: 1.0000 | ORAL_TABLET | ORAL | Status: DC | PRN

## 2020-06-03 MED ORDER — LACTATED RINGERS IV SOLN: INTRAVENOUS | Status: DC

## 2020-06-03 MED ORDER — OXYTOCIN-SODIUM CHLORIDE 30-0.9 UT/500ML-% IV SOLN
2.5000 [IU]/h | INTRAVENOUS | Status: DC
  Administered 2020-06-03: 2.5 [IU]/h via INTRAVENOUS
  Filled 2020-06-03: qty 500

## 2020-06-03 MED ORDER — TETANUS-DIPHTH-ACELL PERTUSSIS 5-2.5-18.5 LF-MCG/0.5 IM SUSP: 0.5000 mL | Freq: Once | INTRAMUSCULAR | Status: DC

## 2020-06-03 MED ORDER — ENOXAPARIN SODIUM 40 MG/0.4ML ~~LOC~~ SOLN
40.0000 mg | SUBCUTANEOUS | Status: DC
  Administered 2020-06-04 – 2020-06-05 (×2): 40 mg via SUBCUTANEOUS
  Filled 2020-06-03 (×2): qty 0.4

## 2020-06-03 MED ORDER — SOD CITRATE-CITRIC ACID 500-334 MG/5ML PO SOLN: 30.0000 mL | ORAL | Status: DC | PRN

## 2020-06-03 MED ORDER — SIMETHICONE 80 MG PO CHEW: 80.0000 mg | CHEWABLE_TABLET | ORAL | Status: DC | PRN

## 2020-06-03 MED ORDER — ONDANSETRON HCL 4 MG PO TABS: 4.0000 mg | ORAL_TABLET | ORAL | Status: DC | PRN

## 2020-06-03 MED ORDER — LACTATED RINGERS IV SOLN
500.0000 mL | Freq: Once | INTRAVENOUS | Status: AC
  Administered 2020-06-03: 500 mL via INTRAVENOUS

## 2020-06-03 MED ORDER — OXYCODONE HCL 5 MG PO TABS: 10.0000 mg | ORAL_TABLET | ORAL | Status: DC | PRN

## 2020-06-03 MED ORDER — DIPHENHYDRAMINE HCL 50 MG/ML IJ SOLN: 12.5000 mg | INTRAMUSCULAR | Status: DC | PRN

## 2020-06-03 MED ORDER — SODIUM CHLORIDE (PF) 0.9 % IJ SOLN
INTRAMUSCULAR | Status: DC | PRN
  Administered 2020-06-03: 12 mL/h via EPIDURAL

## 2020-06-03 MED ORDER — OXYCODONE-ACETAMINOPHEN 5-325 MG PO TABS: 2.0000 | ORAL_TABLET | ORAL | Status: DC | PRN

## 2020-06-03 MED ORDER — LIDOCAINE HCL (PF) 1 % IJ SOLN
INTRAMUSCULAR | Status: DC | PRN
  Administered 2020-06-03: 5 mL via EPIDURAL

## 2020-06-03 MED ORDER — COCONUT OIL OIL: 1.0000 "application " | TOPICAL_OIL | Status: DC | PRN

## 2020-06-03 MED ORDER — LACTATED RINGERS IV SOLN: 500.0000 mL | INTRAVENOUS | Status: DC | PRN

## 2020-06-03 NOTE — Progress Notes (Signed)
FHT cat one UCs q2-3 min Cx 4/C/0 AROM clear

## 2020-06-03 NOTE — Anesthesia Procedure Notes (Addendum)
Epidural Patient location during procedure: OB Start time: 06/03/2020 5:20 PM End time: 06/03/2020 5:38 PM  Staffing Anesthesiologist: Barnet Glasgow, MD Performed: anesthesiologist   Preanesthetic Checklist Completed: patient identified, IV checked, site marked, risks and benefits discussed, surgical consent, monitors and equipment checked, pre-op evaluation and timeout performed  Epidural Patient position: sitting Prep: DuraPrep and site prepped and draped Patient monitoring: continuous pulse ox and blood pressure Approach: midline Location: L3-L4 Injection technique: LOR air  Needle:  Needle type: Tuohy  Needle gauge: 17 G Needle length: 9 cm and 9 Needle insertion depth: 6 cm Catheter type: closed end flexible Catheter size: 19 Gauge Catheter at skin depth: 12 cm Test dose: negative  Assessment Events: blood not aspirated, injection not painful, no injection resistance, no paresthesia and negative IV test  Additional Notes Patient identified. Risks/Benefits/Options discussed with patient including but not limited to bleeding, infection, nerve damage, paralysis, failed block, incomplete pain control, headache, blood pressure changes, nausea, vomiting, reactions to medication both or allergic, itching and postpartum back pain. Confirmed with bedside nurse the patient's most recent platelet count. Confirmed with patient that they are not currently taking any anticoagulation, have any bleeding history or any family history of bleeding disorders. Patient expressed understanding and wished to proceed. All questions were answered. Sterile technique was used throughout the entire procedure. Please see nursing notes for vital signs. Test dose was given through epidural needle and negative prior to continuing to dose epidural or start infusion. Warning signs of high block given to the patient including shortness of breath, tingling/numbness in hands, complete motor block, or any  concerning symptoms with instructions to call for help. Patient was given instructions on fall risk and not to get out of bed. All questions and concerns addressed with instructions to call with any issues. 1 Attempt (S) . Patient tolerated procedure well.

## 2020-06-03 NOTE — Anesthesia Preprocedure Evaluation (Signed)
Anesthesia Evaluation  Patient identified by MRN, date of birth, ID band Patient awake    Reviewed: Allergy & Precautions, NPO status , Patient's Chart, lab work & pertinent test results  Airway Mallampati: II  TM Distance: >3 FB Neck ROM: Full    Dental no notable dental hx. (+) Teeth Intact   Pulmonary neg pulmonary ROS,    Pulmonary exam normal breath sounds clear to auscultation       Cardiovascular negative cardio ROS Normal cardiovascular exam Rhythm:Regular Rate:Normal     Neuro/Psych negative neurological ROS  negative psych ROS   GI/Hepatic Neg liver ROS, GERD  ,  Endo/Other  negative endocrine ROS  Renal/GU negative Renal ROS     Musculoskeletal Hx of chronic pain   Abdominal   Peds  Hematology  (+) Sickle cell anemia and anemia , Congenital Plasminogen Activator deficiency Type 1 was on Heparin  Lab Results      Component                Value               Date                      WBC                      15.4 (H)            06/03/2020                HGB                      7.7 (L)             06/03/2020                HCT                      23.5 (L)            06/03/2020                MCV                      102.6 (H)           06/03/2020                PLT                      670 (H)             06/03/2020              Anesthesia Other Findings   Reproductive/Obstetrics (+) Pregnancy                            Anesthesia Physical Anesthesia Plan  ASA: III  Anesthesia Plan: Epidural   Post-op Pain Management:    Induction:   PONV Risk Score and Plan:   Airway Management Planned: Natural Airway  Additional Equipment: None  Intra-op Plan:   Post-operative Plan:   Informed Consent: I have reviewed the patients History and Physical, chart, labs and discussed the procedure including the risks, benefits and alternatives for the proposed anesthesia with the  patient or authorized representative who has indicated his/her understanding and acceptance.       Plan Discussed with:  Anesthesia Plan Comments: (39.1 wk G5P1 w [place] dz and Plasminogen Activator Type 1 deficiency was on Heparin )        Anesthesia Quick Evaluation

## 2020-06-03 NOTE — Progress Notes (Signed)
Operative Delivery Note At 8:16 PM a viable female was delivered via Vaginal, Vacuum Neurosurgeon).  Presentation: vertex; Position: Right,, Occiput,, Anterior; Station: +4.  Verbal consent: obtained from patient.  Risks and benefits discussed in detail.  Risks include, but are not limited to the risks of anesthesia, bleeding, infection, damage to maternal tissues, fetal cephalhematoma.  There is also the risk of inability to effect vaginal delivery of the head, or shoulder dystocia that cannot be resolved by established maneuvers, leading to the need for emergency cesarean section.  Pushing well, terminal bradycardia in 80s. VE D/W patient to shorten bradycardia. 2 finger gentle traction over one UC.  APGAR: 8, 9; weight  .   Placenta status:intact ,to pathology .   Cord: 3 vessels with the following complications: none .  Cord pH: pending  Anesthesia:   Instruments: [place] Episiotomy: None Lacerations: 2nd degree ML lac repaired Suture Repair: 2.0 vicryl rapide Est. Blood Loss (mL): 150  Mom to postpartum.  Baby to Couplet care / Skin to Skin.  Shon Millet II 06/03/2020, 8:33 PM

## 2020-06-04 LAB — CBC
HCT: 21.2 % — ABNORMAL LOW (ref 36.0–46.0)
Hemoglobin: 7 g/dL — ABNORMAL LOW (ref 12.0–15.0)
MCH: 33.8 pg (ref 26.0–34.0)
MCHC: 33 g/dL (ref 30.0–36.0)
MCV: 102.4 fL — ABNORMAL HIGH (ref 80.0–100.0)
Platelets: 634 10*3/uL — ABNORMAL HIGH (ref 150–400)
RBC: 2.07 MIL/uL — ABNORMAL LOW (ref 3.87–5.11)
RDW: 19.2 % — ABNORMAL HIGH (ref 11.5–15.5)
WBC: 21.6 10*3/uL — ABNORMAL HIGH (ref 4.0–10.5)
nRBC: 10.9 % — ABNORMAL HIGH (ref 0.0–0.2)

## 2020-06-04 MED ORDER — FOLIC ACID 1 MG PO TABS
1.0000 mg | ORAL_TABLET | Freq: Every day | ORAL | Status: DC
  Administered 2020-06-04 – 2020-06-05 (×2): 1 mg via ORAL
  Filled 2020-06-04 (×2): qty 1

## 2020-06-04 NOTE — Anesthesia Postprocedure Evaluation (Signed)
Anesthesia Post Note  Patient: Krista [name]  Procedure(s) Performed: AN AD [place]     Patient location during evaluation: Mother Baby Anesthesia Type: Epidural Level of consciousness: awake Pain management: satisfactory to patient Vital Signs Assessment: post-procedure vital signs reviewed and stable Respiratory status: spontaneous breathing Cardiovascular status: stable Anesthetic complications: no   No complications documented.  Last Vitals:  Vitals:   06/03/20 2343 06/04/20 0400  BP: 104/66 103/64  Pulse: 61 73  Resp: 16 15  Temp: 36.7 C 36.7 C  SpO2: 100% 100%    Last Pain:  Vitals:   06/04/20 0400  TempSrc: Oral  PainSc: 0-No pain   Pain Goal:                   [employer]

## 2020-06-04 NOTE — Lactation Note (Signed)
This note was copied from a baby's chart. Lactation Consultation Note  Patient Name: Krista [name] NOMVE'H Date: 06/04/2020 Reason for consult: Initial assessment;Term Mom is a  P2G2. Mom resting on arrival. [name] Krista [name] now 72 hours old.  Mom reports was breastfeeding well until circumsized. Now sleepy. Urged to feed on cue and 8-12 or more times day.  Mom reppots she breastfed her first baby until 77 months.  Started doing both breastfeeding and formula around 6 months.  No challanaes,  Parents report they feel he is bf well.  Reviewed and Krista [name] Consultation Services handout.  Urged to call lactation as needed  Maternal Data    Feeding    LATCH Score                   Interventions Interventions: Breast feeding basics reviewed  Lactation Tools Discussed/Used     Consult Status Consult Status: Follow-up Date: 06/05/20 Follow-up type: In-patient    Krista [name] 06/04/2020, 4:20 PM

## 2020-06-04 NOTE — Progress Notes (Signed)
Post Partum Day 1 Subjective: no complaints, up ad lib, voiding and tolerating PO  Objective: Blood pressure 103/64, pulse 85, temperature 98 F (36.7 C), temperature source Oral, resp. rate 18, height 5\' 7"  (1.702 m), weight 73 kg, last menstrual period 08/09/2019, SpO2 100 %, unknown if currently breastfeeding.  Physical Exam:  General: alert, cooperative, appears stated age and no distress Lochia: appropriate Uterine Fundus: firm Incision: healing well DVT Evaluation: No evidence of DVT seen on physical exam.  Recent Labs    06/03/20 1144 06/04/20 0531  HGB 7.7* 7.0*  HCT 23.5* 21.2*    Assessment/Plan: Plan for discharge tomorrow, Breastfeeding and Circumcision prior to discharge  Restart lovenox due to Sickle cell disease   LOS: 1 day   Krista [name] 06/04/2020, 9:49 AM

## 2020-06-05 LAB — SURGICAL PATHOLOGY

## 2020-06-05 LAB — PATHOLOGIST SMEAR REVIEW

## 2020-06-05 LAB — RPR: RPR Ser Ql: NONREACTIVE — AB

## 2020-06-05 MED ORDER — ENOXAPARIN SODIUM 40 MG/0.4ML ~~LOC~~ SOLN: 40.0000 mg | SUBCUTANEOUS | 1 refills | Status: DC

## 2020-06-05 NOTE — Discharge Summary (Signed)
Postpartum Discharge Summary  Date of Service August 19     Patient Name: Krista [name] DOB: 1986-06-26 MRN: [id number]  Date of admission: 06/03/2020 Delivery date:06/03/2020  Delivering provider: Everlene Farrier  Date of discharge: 06/05/2020  Admitting diagnosis: Term pregnancy [Z34.90] Intrauterine pregnancy: [redacted]w[redacted]d    Secondary diagnosis:  Active Problems:   Term pregnancy  Additional problems: Sickle Cell Disease    Discharge diagnosis: Term Pregnancy Delivered                                              Post partum procedures:none Augmentation: AROM, Pitocin and Cytotec Complications: None  Hospital course: Induction of Labor With Vaginal Delivery   34y.o. yo [id number] admitted to the hospital 06/03/2020 for induction of labor.  Indication for induction: sickle cell.  Patient had an uncomplicated labor course as follows: Membrane Rupture Time/Date: 5:39 PM ,06/03/2020   Delivery Method:Vaginal, Vacuum (Extractor)  Episiotomy: None  Lacerations:  2nd degree  Details of delivery can be found in separate delivery note.  Patient had a routine postpartum course. Patient is discharged home 06/05/20.  Newborn Data: Birth date:06/03/2020  Birth time:8:16 PM  Gender:Female  Living status:Living  Apgars:8 ,9  Weight:3314 g   Magnesium Sulfate received: No BMZ received: No Rhophylac:No MMR:No T-DaP:Given prenatally Flu: No Transfusion:No  Physical exam  Vitals:   06/04/20 0918 06/04/20 1411 06/04/20 2113 06/05/20 0549  BP:  (!) 104/59 110/69 117/76  Pulse: 85 74 84 85  Resp: 18 16 18 18   Temp:  98.1 F (36.7 C) 98.5 F (36.9 C) 98 F (36.7 C)  TempSrc:  Oral Oral Oral  SpO2:   98% 100%  Weight:      Height:       General: alert, cooperative and no distress Lochia: appropriate Uterine Fundus: firm Incision: Healing well with no significant drainage DVT Evaluation: No evidence of DVT seen on physical exam. Labs: Lab Results  Component Value  Date   WBC 21.6 (H) 06/04/2020   HGB 7.0 (L) 06/04/2020   HCT 21.2 (L) 06/04/2020   MCV 102.4 (H) 06/04/2020   PLT 634 (H) 06/04/2020   CMP Latest Ref Rng & Units 02/14/2020  Glucose 70 - 99 mg/dL 114(H)  BUN 6 - 20 mg/dL <5(L)  Creatinine 0.44 - 1.00 mg/dL 0.45  Sodium 135 - 145 mmol/L 139  Potassium 3.5 - 5.1 mmol/L 3.5  Chloride 98 - 111 mmol/L 109  CO2 22 - 32 mmol/L 20(L)  Calcium 8.9 - 10.3 mg/dL 8.5(L)  Total Protein 6.5 - 8.1 g/dL 6.3(L)  Total Bilirubin 0.3 - 1.2 mg/dL 1.8(H)  Alkaline Phos 38 - 126 U/L 58  AST 15 - 41 U/L 24  ALT 0 - 44 U/L 18   Edinburgh Score: Edinburgh Postnatal Depression Scale Screening Tool 06/04/2020  I have been able to laugh and see the funny side of things. 0  I have looked forward with enjoyment to things. 0  I have blamed myself unnecessarily when things went wrong. 0  I have been anxious or worried for no good reason. 0  I have felt scared or panicky for no good reason. 0  Things have been getting on top of me. 0  I have been so unhappy that I have had difficulty sleeping. 0  I have felt sad or miserable. 0  I have been so unhappy that I have been crying. 0  The thought of harming myself has occurred to me. 0  Edinburgh Postnatal Depression Scale Total 0      After visit meds:  Allergies as of 06/05/2020      Reactions   Keflex [cephalexin] Swelling   Face swelling      Medication List    STOP taking these medications   heparin 10000 UNIT/ML injection     TAKE these medications   acetaminophen 325 MG tablet Commonly known as: TYLENOL Take 650 mg by mouth every 6 (six) hours as needed for mild pain or headache.   aspirin 81 MG EC tablet Take 81 mg by mouth daily.   cetirizine 10 MG tablet Commonly known as: ZYRTEC Take 1 tablet by mouth once daily   enoxaparin 40 MG/0.4ML injection Commonly known as: LOVENOX Inject 0.4 mLs (40 mg total) into the skin daily.   folic acid 1 MG tablet Commonly known as:  FOLVITE Take 1 tablet (1 mg total) by mouth daily.   ibuprofen 600 MG tablet Commonly known as: ADVIL Take 1 tablet (600 mg total) by mouth every 8 (eight) hours as needed.   ketorolac 0.5 % ophthalmic solution Commonly known as: ACULAR Place 1 drop into the left eye 4 (four) times daily.   metoCLOPramide 10 MG tablet Commonly known as: REGLAN Take 1 tablet (10 mg total) by mouth every 6 (six) hours.   mometasone 0.1 % cream Commonly known as: ELOCON Apply 1 application topically as needed.   oxycodone 5 MG capsule Commonly known as: OXY-IR Take 1 capsule (5 mg total) by mouth every 4 (four) hours as needed.   prednisoLONE acetate 1 % ophthalmic suspension Commonly known as: PRED FORTE Place 1 drop into the left eye 4 (four) times daily.   prenatal multivitamin Tabs tablet Take 1 tablet by mouth daily at 12 noon.   Prolensa 0.07 % Soln Generic drug: Bromfenac Sodium Place 1 drop into the left eye 4 (four) times daily.   Vitamin D3 25 MCG (1000 UT) Caps TAKE 1 CAPSULE BY MOUTH ONCE DAILY        Discharge home in stable condition Infant Feeding: Breast Infant Disposition:home with mother Discharge instruction: per After Visit Summary and Postpartum booklet. Activity: Advance as tolerated. Pelvic rest for 6 weeks.  Diet: routine diet Anticipated Birth Control: Unsure Postpartum Appointment:6 weeks Additional Postpartum F/U: post partum Future Appointments: Future Appointments  Date Time Provider [place]  08/27/2020  8:00 AM Bernarda Caffey, MD T[facility] None   Follow up Visit:      06/05/2020 Cyril Mourning, MD

## 2020-06-05 NOTE — Lactation Note (Signed)
This note was copied from a baby's chart. Lactation Consultation Note  Patient Name: Krista [name] AYTKZ'S Date: 06/05/2020   Mom is a P2. Infant is 80 hrs old. Mom has felt some discomfort with latching; nipples atraumatic. Mom denies nipple shape distortion when infant releases latch.  Mom reports that when she latches both lips touch at the same time. Specifics of an asymmetric latch were shown via [employer].   I assisted with helping infant to latch using the teacup hold. Infant latched with relative ease. Swallows were immediately noted (and verified by cervical auscultation). Mom felt more comfortable.  Dad was shown how he could help with widening infant's gape if needed, checking infant's cheeks, etc.   Parents know how to reach [place] for any post-discharge questions.   Krista [name] [facility] 06/05/2020, 8:25 AM

## 2020-07-29 ENCOUNTER — Ambulatory Visit: Payer: BC Managed Care – PPO | Admitting: Family Medicine

## 2020-07-29 ENCOUNTER — Other Ambulatory Visit: Payer: Self-pay

## 2020-07-29 ENCOUNTER — Inpatient Hospital Stay (HOSPITAL_COMMUNITY)
Admission: EM | Admit: 2020-07-29 | Discharge: 2020-07-31 | DRG: 193 | Disposition: A | Discharge status: Home / Self Care | Payer: BC Managed Care – PPO | Source: Ambulatory Visit | Attending: Internal Medicine | Admitting: Internal Medicine

## 2020-07-29 ENCOUNTER — Encounter (HOSPITAL_COMMUNITY): Payer: Self-pay | Admitting: Emergency Medicine

## 2020-07-29 ENCOUNTER — Telehealth (INDEPENDENT_AMBULATORY_CARE_PROVIDER_SITE_OTHER): Payer: BC Managed Care – PPO | Admitting: Family Medicine

## 2020-07-29 ENCOUNTER — Emergency Department (HOSPITAL_COMMUNITY): Payer: BC Managed Care – PPO

## 2020-07-29 DIAGNOSIS — D571 Sickle-cell disease without crisis: Secondary | ICD-10-CM | POA: Diagnosis not present

## 2020-07-29 DIAGNOSIS — Z20822 Contact with and (suspected) exposure to covid-19: Secondary | ICD-10-CM | POA: Diagnosis present

## 2020-07-29 DIAGNOSIS — D5700 Hb-SS disease with crisis, unspecified: Secondary | ICD-10-CM | POA: Diagnosis present

## 2020-07-29 DIAGNOSIS — Y95 Nosocomial condition: Secondary | ICD-10-CM | POA: Diagnosis present

## 2020-07-29 DIAGNOSIS — R0600 Dyspnea, unspecified: Secondary | ICD-10-CM | POA: Diagnosis not present

## 2020-07-29 DIAGNOSIS — R053 Chronic cough: Secondary | ICD-10-CM

## 2020-07-29 DIAGNOSIS — R071 Chest pain on breathing: Secondary | ICD-10-CM

## 2020-07-29 DIAGNOSIS — Z833 Family history of diabetes mellitus: Secondary | ICD-10-CM | POA: Diagnosis not present

## 2020-07-29 DIAGNOSIS — J189 Pneumonia, unspecified organism: Secondary | ICD-10-CM | POA: Diagnosis present

## 2020-07-29 DIAGNOSIS — E876 Hypokalemia: Secondary | ICD-10-CM | POA: Diagnosis present

## 2020-07-29 DIAGNOSIS — K219 Gastro-esophageal reflux disease without esophagitis: Secondary | ICD-10-CM | POA: Diagnosis present

## 2020-07-29 DIAGNOSIS — J181 Lobar pneumonia, unspecified organism: Secondary | ICD-10-CM | POA: Diagnosis present

## 2020-07-29 DIAGNOSIS — G894 Chronic pain syndrome: Secondary | ICD-10-CM | POA: Diagnosis present

## 2020-07-29 DIAGNOSIS — D649 Anemia, unspecified: Secondary | ICD-10-CM

## 2020-07-29 DIAGNOSIS — Z8249 Family history of ischemic heart disease and other diseases of the circulatory system: Secondary | ICD-10-CM | POA: Diagnosis not present

## 2020-07-29 LAB — CBC WITH DIFFERENTIAL/PLATELET
Abs Immature Granulocytes: 0.11 10*3/uL — ABNORMAL HIGH (ref 0.00–0.07)
Basophils Absolute: 0.1 10*3/uL (ref 0.0–0.1)
Basophils Relative: 1 %
Eosinophils Absolute: 0.1 10*3/uL (ref 0.0–0.5)
Eosinophils Relative: 1 %
HCT: 19.5 % — ABNORMAL LOW (ref 36.0–46.0)
Hemoglobin: 6.3 g/dL — CL (ref 12.0–15.0)
Immature Granulocytes: 1 %
Lymphocytes Relative: 28 %
Lymphs Abs: 2.8 10*3/uL (ref 0.7–4.0)
MCH: 32 pg (ref 26.0–34.0)
MCHC: 32.3 g/dL (ref 30.0–36.0)
MCV: 99 fL (ref 80.0–100.0)
Monocytes Absolute: 0.7 10*3/uL (ref 0.1–1.0)
Monocytes Relative: 7 %
Neutro Abs: 6.2 10*3/uL (ref 1.7–7.7)
Neutrophils Relative %: 62 %
Platelets: 752 10*3/uL — ABNORMAL HIGH (ref 150–400)
RBC: 1.97 MIL/uL — ABNORMAL LOW (ref 3.87–5.11)
RDW: 25.8 % — ABNORMAL HIGH (ref 11.5–15.5)
WBC: 10 10*3/uL (ref 4.0–10.5)
nRBC: 23.6 % — ABNORMAL HIGH (ref 0.0–0.2)

## 2020-07-29 LAB — I-STAT BETA HCG BLOOD, ED (MC, WL, AP ONLY): I-stat hCG, quantitative: <5 m[IU]/mL (ref <5)

## 2020-07-29 LAB — COMPREHENSIVE METABOLIC PANEL
ALT: 25 U/L (ref 0–44)
AST: 22 U/L (ref 15–41)
Albumin: 4.1 g/dL (ref 3.5–5.0)
Alkaline Phosphatase: 89 U/L (ref 38–126)
Anion gap: 10 (ref 5–15)
BUN: <5 mg/dL — ABNORMAL LOW (ref 6–20)
CO2: 25 mmol/L (ref 22–32)
Calcium: 8.5 mg/dL — ABNORMAL LOW (ref 8.9–10.3)
Chloride: 108 mmol/L (ref 98–111)
Creatinine, Ser: 0.58 mg/dL (ref 0.44–1.00)
GFR, Estimated: >60 mL/min (ref >60)
Glucose, Bld: 84 mg/dL (ref 70–99)
Potassium: 3.6 mmol/L (ref 3.5–5.1)
Sodium: 143 mmol/L (ref 135–145)
Total Bilirubin: 4.4 mg/dL — ABNORMAL HIGH (ref 0.3–1.2)
Total Protein: 7.4 g/dL (ref 6.5–8.1)

## 2020-07-29 LAB — RESP PANEL BY RT PCR (RSV, FLU A&B, COVID)
Influenza A by PCR: NEGATIVE
Influenza B by PCR: NEGATIVE
Respiratory Syncytial Virus by PCR: NEGATIVE
SARS Coronavirus 2 by RT PCR: NEGATIVE

## 2020-07-29 LAB — TROPONIN I (HIGH SENSITIVITY): Troponin I (High Sensitivity): 3 ng/L (ref <18)

## 2020-07-29 LAB — PREPARE RBC (CROSSMATCH)

## 2020-07-29 MED ORDER — KETOROLAC TROMETHAMINE 15 MG/ML IJ SOLN
15.0000 mg | Freq: Four times a day (QID) | INTRAMUSCULAR | Status: DC
  Administered 2020-07-30 – 2020-07-31 (×7): 15 mg via INTRAVENOUS
  Filled 2020-07-29 (×7): qty 1

## 2020-07-29 MED ORDER — IBUPROFEN 800 MG PO TABS: 800.0000 mg | ORAL_TABLET | Freq: Three times a day (TID) | ORAL | 1 refills | Status: DC | PRN

## 2020-07-29 MED ORDER — FOLIC ACID 1 MG PO TABS
1.0000 mg | ORAL_TABLET | Freq: Every day | ORAL | Status: DC
  Administered 2020-07-30 – 2020-07-31 (×2): 1 mg via ORAL
  Filled 2020-07-29 (×2): qty 1

## 2020-07-29 MED ORDER — PREDNISOLONE ACETATE 1 % OP SUSP
1.0000 [drp] | Freq: Four times a day (QID) | OPHTHALMIC | Status: DC
  Administered 2020-07-30 – 2020-07-31 (×6): 1 [drp] via OPHTHALMIC
  Filled 2020-07-29: qty 5

## 2020-07-29 MED ORDER — OXYCODONE HCL 5 MG PO TABS: 5.0000 mg | ORAL_TABLET | ORAL | Status: DC | PRN

## 2020-07-29 MED ORDER — POLYETHYLENE GLYCOL 3350 17 G PO PACK: 17.0000 g | PACK | Freq: Every day | ORAL | Status: DC | PRN

## 2020-07-29 MED ORDER — KETOROLAC TROMETHAMINE 0.5 % OP SOLN: 1.0000 [drp] | Freq: Four times a day (QID) | OPHTHALMIC | Status: DC

## 2020-07-29 MED ORDER — SENNOSIDES-DOCUSATE SODIUM 8.6-50 MG PO TABS
1.0000 | ORAL_TABLET | Freq: Two times a day (BID) | ORAL | Status: DC
  Administered 2020-07-30 – 2020-07-31 (×3): 1 via ORAL
  Filled 2020-07-29 (×4): qty 1

## 2020-07-29 MED ORDER — LEVOFLOXACIN IN D5W 750 MG/150ML IV SOLN
750.0000 mg | INTRAVENOUS | Status: DC
  Administered 2020-07-30: 750 mg via INTRAVENOUS
  Filled 2020-07-29: qty 150

## 2020-07-29 MED ORDER — KETOROLAC TROMETHAMINE 0.5 % OP SOLN
1.0000 [drp] | Freq: Four times a day (QID) | OPHTHALMIC | Status: DC
  Administered 2020-07-30 – 2020-07-31 (×6): 1 [drp] via OPHTHALMIC
  Filled 2020-07-29: qty 5

## 2020-07-29 MED ORDER — SODIUM CHLORIDE 0.9 % IV SOLN
10.0000 mL/h | Freq: Once | INTRAVENOUS | Status: AC
  Administered 2020-07-29: 10 mL/h via INTRAVENOUS

## 2020-07-29 MED ORDER — FOLIC ACID 1 MG PO TABS: 1.0000 mg | ORAL_TABLET | Freq: Every day | ORAL | 3 refills | Status: DC

## 2020-07-29 MED ORDER — PRENATAL MULTIVITAMIN CH
1.0000 | ORAL_TABLET | Freq: Every day | ORAL | Status: DC
  Administered 2020-07-30 – 2020-07-31 (×2): 1 via ORAL
  Filled 2020-07-29 (×2): qty 1

## 2020-07-29 MED ORDER — SODIUM CHLORIDE 0.9% IV SOLUTION: Freq: Once | INTRAVENOUS | Status: AC

## 2020-07-29 MED ORDER — LEVOFLOXACIN IN D5W 750 MG/150ML IV SOLN
750.0000 mg | Freq: Once | INTRAVENOUS | Status: AC
  Administered 2020-07-29: 750 mg via INTRAVENOUS
  Filled 2020-07-29: qty 150

## 2020-07-29 MED ORDER — ENOXAPARIN SODIUM 40 MG/0.4ML ~~LOC~~ SOLN
40.0000 mg | SUBCUTANEOUS | Status: DC
  Administered 2020-07-30 – 2020-07-31 (×2): 40 mg via SUBCUTANEOUS
  Filled 2020-07-29 (×2): qty 0.4

## 2020-07-29 NOTE — ED Provider Notes (Signed)
[place] DEPT Provider Note   CSN: [id number] Arrival date & time: 07/29/20  1901     History Chief Complaint  Patient presents with  . Anemia  . Pneumonia    Krista [name] is a 34 y.o. female.  HPI     Patient presents with cough, congestion, fatigue, chest pain. Patient has history of sickle cell anemia, is 8 weeks postpartum after completed pregnancy.  She does note that during pregnancy she was receiving Lovenox injections, denies any obvious bleeding since then, has not had resumption of her menstrual periods. About 1 week ago she developed cough, congestion, fatigue, chest pain.  Symptoms have been present since that time, with no relief with anything. Yesterday she went to urgent care, had x-ray, subsequently CT scan performed today due to symptoms.  CT reportedly consistent with x-ray findings demonstrating pneumonia, no evidence for pulmonary embolism. Patient sent here for evaluation.  Past Medical History:  Diagnosis Date  . Chronic pain    with sickle cell   . Eczema   . Heart murmur    07-13-2019  per pt since childhood,  has not ever had a echo done ,  asymptomatic  . History of blood transfusion    x3   . Missed ab   . Sickle cell anemia ([place])    followed by pcp---  last crisis 12-14-2018  . Vitamin D deficiency   . Wears glasses     Patient Active Problem List   Diagnosis Date Noted  . Term pregnancy 06/03/2020  . Sickle cell pain crisis ([place]) 12/14/2018  . Pregnancy 05/04/2018  . Amenorrhea 06/11/2017  . Cellulitis of left upper arm 01/11/2017  . 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 Thrombocytosis 08/12/2015  . Gastroesophageal reflux disease without esophagitis 08/12/2015  . Sickle cell anemia ([place]) 02/03/2013    Past Surgical History:  Procedure Laterality Date  . EYE SURGERY Left 11/12/2019  . LAPAROSCOPIC CHOLECYSTECTOMY  1997     OB History    Gravida  5   Para  2   Term  2    Preterm      AB  3   Living  2     SAB  3   TAB      Ectopic      Multiple  0   Live Births  2           Family History  Problem Relation Age of Onset  . Diabetes Father   . Hypertension Father   . Heart disease Brother   . Cancer Maternal Grandmother   . Heart disease Brother   . Breast cancer Paternal Aunt   . Cancer Paternal Uncle     Social History   Tobacco Use  . Smoking status: Never Smoker  . Smokeless tobacco: Never Used  Vaping Use  . Vaping Use: Never used  Substance Use Topics  . Alcohol use: Not Currently    Comment: socially  . Drug use: Never    Home Medications Prior to Admission medications   Medication Sig Start Date End Date Taking? Authorizing Provider  acetaminophen (TYLENOL) 325 MG tablet Take 650 mg by mouth every 6 (six) hours as needed for mild pain or headache. Patient not taking: Reported on 07/29/2020    [provider]  aspirin 81 MG EC tablet Take 81 mg by mouth daily.  Patient not taking: Reported on 07/29/2020    [provider]  Bromfenac Sodium (PROLENSA) 0.07 %  SOLN Place 1 drop into the left eye 4 (four) times daily. 02/25/20   Bernarda Caffey, MD  cetirizine (ZYRTEC) 10 MG tablet Take 1 tablet by mouth once daily Patient not taking: Reported on 06/03/2020 03/14/20   Dorena Dew, FNP  Cholecalciferol (VITAMIN D3) 25 MCG (1000 UT) CAPS TAKE 1 CAPSULE BY MOUTH ONCE DAILY Patient not taking: Reported on 12/25/2019 09/12/18   Lanae Boast, FNP  enoxaparin (LOVENOX) 40 MG/0.4ML injection Inject 0.4 mLs (40 mg total) into the skin daily. Patient not taking: Reported on 07/29/2020 06/05/20   Dian Queen, MD  folic acid (FOLVITE) 1 MG tablet Take 1 tablet (1 mg total) by mouth daily. 07/29/20   Dorena Dew, FNP  ibuprofen (ADVIL) 800 MG tablet Take 1 tablet (800 mg total) by mouth every 8 (eight) hours as needed. 07/29/20   Dorena Dew, FNP  ketorolac (ACULAR) 0.5 % ophthalmic solution Place 1  drop into the left eye 4 (four) times daily. 02/25/20 02/24/21  Bernarda Caffey, MD  metoCLOPramide (REGLAN) 10 MG tablet Take 1 tablet (10 mg total) by mouth every 6 (six) hours. Patient not taking: Reported on 02/19/2020 02/14/20   Starr Lake, CNM  mometasone (ELOCON) 0.1 % cream Apply 1 application topically as needed. Patient not taking: Reported on 02/19/2020 08/21/19   Dorena Dew, FNP  oxycodone (OXY-IR) 5 MG capsule Take 1 capsule (5 mg total) by mouth every 4 (four) hours as needed. Patient not taking: Reported on 02/19/2020 02/14/20   Starr Lake, CNM  prednisoLONE acetate (PRED FORTE) 1 % ophthalmic suspension Place 1 drop into the left eye 4 (four) times daily. 03/17/20   Bernarda Caffey, MD  Prenatal Vit-Fe Fumarate-FA (PRENATAL MULTIVITAMIN) TABS tablet Take 1 tablet by mouth daily at 12 noon.    [provider]    Allergies    Keflex [cephalexin]  Review of Systems   Review of Systems  Constitutional:       Per HPI, otherwise negative  HENT:       Per HPI, otherwise negative  Respiratory:       Per HPI, otherwise negative  Cardiovascular:       Per HPI, otherwise negative  Gastrointestinal: Negative for vomiting.  Endocrine:       Negative aside from HPI  Genitourinary:       Neg aside from HPI   Musculoskeletal:       Per HPI, otherwise negative  Skin: Negative.   Allergic/Immunologic: Positive for immunocompromised state.  Neurological: Negative for syncope.  Hematological:       Sickle cell disease    Physical Exam Updated Vital Signs BP 120/84   Pulse 91   Temp 98.7 F (37.1 C) (Oral)   Resp 14   Ht 5\' 7"  (1.702 m)   Wt 72.6 kg   LMP 08/09/2019   SpO2 92%   BMI 25.06 kg/m   Physical Exam Vitals and nursing note reviewed.  Constitutional:      General: She is not in acute distress.    Appearance: She is well-developed.  HENT:     Head: Normocephalic and atraumatic.  Eyes:     Conjunctiva/sclera: Conjunctivae  normal.  Cardiovascular:     Rate and Rhythm: Normal rate and regular rhythm.  Pulmonary:     Effort: Pulmonary effort is normal. No respiratory distress.     Breath sounds: Normal breath sounds. No stridor.  Abdominal:     General: There is no distension.  Skin:  General: Skin is warm and dry.  Neurological:     Mental Status: She is alert and oriented to person, place, and time.     Cranial Nerves: No cranial nerve deficit.      ED Results / Procedures / Treatments   Labs (all labs ordered are listed, but only abnormal results are displayed) Labs Reviewed  COMPREHENSIVE METABOLIC PANEL - Abnormal; Notable for the following components:      Result Value   BUN <5 (*)    Calcium 8.5 (*)    Total Bilirubin 4.4 (*)    All other components within normal limits  CBC WITH DIFFERENTIAL/PLATELET - Abnormal; Notable for the following components:   RBC 1.97 (*)    Hemoglobin 6.3 (*)    HCT 19.5 (*)    RDW 25.8 (*)    Platelets 752 (*)    nRBC 23.6 (*)    Abs Immature Granulocytes 0.11 (*)    All other components within normal limits  RESP PANEL BY RT PCR (RSV, FLU A&B, COVID)  I-STAT BETA HCG BLOOD, ED ([facility], [facility], [facility] ONLY)  TYPE AND SCREEN  TROPONIN I (HIGH SENSITIVITY)  TROPONIN I (HIGH SENSITIVITY)    EKG EKG Interpretation  Date/Time:  Tuesday July 29 2020 20:23:39 EDT Ventricular Rate:  79 PR Interval:    QRS Duration: 91 QT Interval:  388 QTC Calculation: 445 R Axis:   50 Text Interpretation: Sinus rhythm 12 Lead; [place]-Likar unremarkable ecg Confirmed by Carmin Muskrat [phone]) on 07/29/2020 8:49:57 PM   Radiology DG Chest Port 1 View  Result Date: 07/29/2020 CLINICAL DATA:  Concern for pneumonia EXAM: PORTABLE CHEST 1 VIEW COMPARISON:  Radiograph 02/05/2013, CT 02/05/2013 FINDINGS: Consolidative opacity present in the right perihilar region. Suspect small bilateral effusions. Some more diffuse hazy interstitial opacity with central vascular congestion,  cuffing and cardiomegaly could reflect a degree of pulmonary edema with CHF/fluid overload. No pneumothorax. No acute osseous or soft tissue abnormality. Telemetry leads overlie the chest. IMPRESSION: 1. Consolidative opacity in the right perihilar region, concerning for pneumonia. 2. Additional features suggesting a degree of CHF/fluid overload with cardiomegaly, small bilateral effusions, and likely interstitial edema. Electronically Signed   By: Lovena Le M.D.   On: 07/29/2020 21:23    CT Angiography from OSH   Procedures Procedures (including critical care time)  Medications Ordered in ED Medications - No data to display  ED Course  I have reviewed the triage vital signs and the nursing notes.  Pertinent labs & imaging results that were available during my care of the patient were reviewed by me and considered in my medical decision making (see chart for details).  Update:, Labs notable for critically abnormal hemoglobin value, 6.3.  9:54 PM Patient awake alert, aware of all findings including x-ray consistent with pneumonia, critically abnormal hemoglobin value.  Given this combination, patient will require transfusion, antibiotics, admission. Final Clinical Impression(s) / ED Diagnoses Final diagnoses:  Community acquired pneumonia of right lower lobe of lung  Symptomatic anemia   MDM Number of Diagnoses or Management Options Community acquired pneumonia of right lower lobe of lung: new, needed workup Symptomatic anemia: new, needed workup   Amount and/or Complexity of Data Reviewed Clinical lab tests: reviewed Tests in the radiology section of CPT: reviewed Tests in the medicine section of CPT: reviewed Decide to obtain previous medical records or to obtain history from someone other than the patient: yes Review and summarize past medical records: yes Discuss the patient with other providers: yes  Independent visualization of images, tracings, or specimens:  yes  Risk of Complications, Morbidity, and/or Mortality Presenting problems: high Diagnostic procedures: high Management options: high  Critical Care Total time providing critical care: 30-74 minutes (35)  Patient Progress Patient progress: stable    Carmin Muskrat, MD 07/29/20 2157

## 2020-07-29 NOTE — ED Notes (Signed)
Date and time results received: 07/29/20 9:10 PM  Test: Hemoglobin Critical Value: 6.3  Name of Provider Notified: Vanita Panda, EDP

## 2020-07-29 NOTE — H&P (Signed)
History and Physical   [name]:[id number] DOB: Aug 30, 1986 DOA: 07/29/2020  Referring MD/NP/PA: Dr. Vanita Panda  PCP: Dorena Dew, FNP   Outpatient Specialists: None  Patient coming from: Home  Chief Complaint: Cough congestions and chest pain  HPI: Krista [name] is a 34 y.o. female with medical history significant of sickle cell disease with anemia baseline hemoglobin between 7 and 8 g, chronic pain syndrome, recent childbirth 8 weeks ago, vitamin D deficiency who presented to the ER with significant cough mild shortness of breath as well as fatigue.  Patient initially was seen at urgent care center yesterday where they had a CT scan today with findings on x-ray and CT showing a right long pneumonia.  It appears to be a lobar pneumonia.  She also has had some weakness and drop in her hemoglobin.  Is currently 6.3 down from her typical 7-7.9.  She is not in sickle cell pain crisis at the moment.  She denies any significant pain except at the hip site.  She also reported recurrent headaches in the last few days.  Patient being admitted with what appears to be lobar pneumonia probably healthcare associated pneumonia since his within the last 3 months.  Also possible anemic crisis..  ED Course: Temperature 98.7 blood pressure 130/85 pulse 110 respirate 23 oxygen sats 92% room air.  White count is 10.1 hemoglobin 6.3 and platelets 752.  Sodium 143 potassium 3.6 chloride 108 CO2 25 BUN less than 5 creatinine 0.5 and calcium 8 5.  Glucose of 84.  Chest x-ray showed consolidative opacity in the right perihilar region.  Possible CHF with fluid overload.  Outside CT showed no PE but confirmed consolidative pneumonia.  She is being admitted to the hospital for further evaluation and treatment.  Review of Systems: As per HPI otherwise 10 point review of systems negative.    Past Medical History:  Diagnosis Date  . Chronic pain    with sickle cell   . Eczema   . Heart murmur    07-13-2019   per pt since childhood,  has not ever had a echo done ,  asymptomatic  . History of blood transfusion    x3   . Missed ab   . Sickle cell anemia ([place])    followed by pcp---  last crisis 12-14-2018  . Vitamin D deficiency   . Wears glasses     Past Surgical History:  Procedure Laterality Date  . EYE SURGERY Left 11/12/2019  . LAPAROSCOPIC CHOLECYSTECTOMY  1997     reports that she has never smoked. She has never used smokeless tobacco. She reports previous alcohol use. She reports that she does not use drugs.  Allergies  Allergen Reactions  . Keflex [Cephalexin] Swelling    Face swelling    Family History  Problem Relation Age of Onset  . Diabetes Father   . Hypertension Father   . Heart disease Brother   . Cancer Maternal Grandmother   . Heart disease Brother   . Breast cancer Paternal Aunt   . Cancer Paternal Uncle      Prior to Admission medications   Medication Sig Start Date End Date Taking? Authorizing Provider  Bromfenac Sodium (PROLENSA) 0.07 % SOLN Place 1 drop into the left eye 4 (four) times daily. 02/25/20  Yes Bernarda Caffey, MD  folic acid (FOLVITE) 1 MG tablet Take 1 tablet (1 mg total) by mouth daily. 07/29/20  Yes Dorena Dew, FNP  ibuprofen (ADVIL) 800 MG tablet Take 1  tablet (800 mg total) by mouth every 8 (eight) hours as needed. Patient taking differently: Take 800 mg by mouth every 8 (eight) hours as needed for moderate pain.  07/29/20  Yes Dorena Dew, FNP  ketorolac (ACULAR) 0.5 % ophthalmic solution Place 1 drop into the left eye 4 (four) times daily. 02/25/20 02/24/21 Yes Bernarda Caffey, MD  prednisoLONE acetate (PRED FORTE) 1 % ophthalmic suspension Place 1 drop into the left eye 4 (four) times daily. 03/17/20  Yes Bernarda Caffey, MD  Prenatal Vit-Fe Fumarate-FA (PRENATAL MULTIVITAMIN) TABS tablet Take 1 tablet by mouth daily at 12 noon.   Yes [provider]  cetirizine (ZYRTEC) 10 MG tablet Take 1 tablet by mouth once  daily Patient not taking: Reported on 06/03/2020 03/14/20   Dorena Dew, FNP  Cholecalciferol (VITAMIN D3) 25 MCG (1000 UT) CAPS TAKE 1 CAPSULE BY MOUTH ONCE DAILY Patient not taking: Reported on 12/25/2019 09/12/18   Lanae Boast, FNP  enoxaparin (LOVENOX) 40 MG/0.4ML injection Inject 0.4 mLs (40 mg total) into the skin daily. Patient not taking: Reported on 07/29/2020 06/05/20   Dian Queen, MD  metoCLOPramide (REGLAN) 10 MG tablet Take 1 tablet (10 mg total) by mouth every 6 (six) hours. Patient not taking: Reported on 02/19/2020 02/14/20   Starr Lake, CNM  mometasone (ELOCON) 0.1 % cream Apply 1 application topically as needed. Patient not taking: Reported on 02/19/2020 08/21/19   Dorena Dew, FNP  oxycodone (OXY-IR) 5 MG capsule Take 1 capsule (5 mg total) by mouth every 4 (four) hours as needed. Patient not taking: Reported on 02/19/2020 02/14/20   Starr Lake, CNM    Physical Exam: Vitals:   07/29/20 1959 07/29/20 2014 07/29/20 2117 07/29/20 2130  BP: 130/85  128/85 120/84  Pulse: 91  85 91  Resp: 20  (!) 23 14  Temp: 98.7 F (37.1 C)     TempSrc: Oral     SpO2: 94%  95% 92%  Weight:  72.6 kg    Height:  5\' 7"  (1.702 m)        Constitutional: NAD, calm, comfortable Vitals:   07/29/20 1959 07/29/20 2014 07/29/20 2117 07/29/20 2130  BP: 130/85  128/85 120/84  Pulse: 91  85 91  Resp: 20  (!) 23 14  Temp: 98.7 F (37.1 C)     TempSrc: Oral     SpO2: 94%  95% 92%  Weight:  72.6 kg    Height:  5\' 7"  (1.702 m)     Eyes: PERRL, lids and conjunctivae pale ENMT: Mucous membranes are moist. Posterior pharynx clear of any exudate or lesions.Normal dentition.  Neck: normal, supple, no masses, no thyromegaly Respiratory: clear to auscultation bilaterally, no wheezing, no crackles. Normal respiratory effort. No accessory muscle use.  Cardiovascular: Regular rate and rhythm, no murmurs / rubs / gallops. No extremity edema. 2+ pedal pulses. No  carotid bruits.  Abdomen: no tenderness, no masses palpated. No hepatosplenomegaly. Bowel sounds positive.  Musculoskeletal: no clubbing / cyanosis. No joint deformity upper and lower extremities. Good ROM, no contractures. Normal muscle tone.  Skin: no rashes, lesions, ulcers. No induration Neurologic: CN 2-12 grossly intact. Sensation intact, DTR normal. Strength 5/5 in all 4.  Psychiatric: Normal judgment and insight. Alert and oriented x 3. Normal mood.     Labs on Admission: I have personally reviewed following labs and imaging studies  CBC: Recent Labs  Lab 07/29/20 2031  WBC 10.0  NEUTROABS 6.2  HGB 6.3*  HCT  19.5*  MCV 99.0  PLT 376*   Basic Metabolic Panel: Recent Labs  Lab 07/29/20 2031  NA 143  K 3.6  CL 108  CO2 25  GLUCOSE 84  BUN <5*  CREATININE 0.58  CALCIUM 8.5*   GFR: Estimated Creatinine Clearance: 97.3 mL/min (by C-G formula based on SCr of 0.58 mg/dL). Liver Function Tests: Recent Labs  Lab 07/29/20 2031  AST 22  ALT 25  ALKPHOS 89  BILITOT 4.4*  PROT 7.4  ALBUMIN 4.1   No results for input(s): LIPASE, AMYLASE in the last 168 hours. No results for input(s): AMMONIA in the last 168 hours. Coagulation Profile: No results for input(s): INR, PROTIME in the last 168 hours. Cardiac Enzymes: No results for input(s): CKTOTAL, CKMB, CKMBINDEX, TROPONINI in the last 168 hours. BNP (last 3 results) No results for input(s): PROBNP in the last 8760 hours. HbA1C: No results for input(s): HGBA1C in the last 72 hours. CBG: No results for input(s): GLUCAP in the last 168 hours. Lipid Profile: No results for input(s): CHOL, HDL, LDLCALC, TRIG, CHOLHDL, LDLDIRECT in the last 72 hours. Thyroid Function Tests: No results for input(s): TSH, T4TOTAL, FREET4, T3FREE, THYROIDAB in the last 72 hours. Anemia Panel: No results for input(s): VITAMINB12, FOLATE, FERRITIN, TIBC, IRON, RETICCTPCT in the last 72 hours. Urine analysis:    Component Value  Date/Time   COLORURINE YELLOW 02/14/2020 1240   APPEARANCEUR CLEAR 02/14/2020 1240   LABSPEC 1.011 02/14/2020 1240   PHURINE 6.0 02/14/2020 1240   GLUCOSEU NEGATIVE 02/14/2020 1240   HGBUR NEGATIVE 02/14/2020 1240   BILIRUBINUR NEGATIVE 02/14/2020 1240   BILIRUBINUR neg 12/25/2019 1503   KETONESUR 5 (A) 02/14/2020 1240   PROTEINUR NEGATIVE 02/14/2020 1240   UROBILINOGEN 1.0 12/25/2019 1503   UROBILINOGEN 4.0 (H) 06/08/2017 1634   NITRITE NEGATIVE 02/14/2020 1240   LEUKOCYTESUR NEGATIVE 02/14/2020 1240   Sepsis Labs: @LABRCNTIP (procalcitonin:4,lacticidven:4) )No results found for this or any previous visit (from the past 240 hour(s)).   Radiological Exams on Admission: DG Chest Port 1 View  Result Date: 07/29/2020 CLINICAL DATA:  Concern for pneumonia EXAM: PORTABLE CHEST 1 VIEW COMPARISON:  Radiograph 02/05/2013, CT 02/05/2013 FINDINGS: Consolidative opacity present in the right perihilar region. Suspect small bilateral effusions. Some more diffuse hazy interstitial opacity with central vascular congestion, cuffing and cardiomegaly could reflect a degree of pulmonary edema with CHF/fluid overload. No pneumothorax. No acute osseous or soft tissue abnormality. Telemetry leads overlie the chest. IMPRESSION: 1. Consolidative opacity in the right perihilar region, concerning for pneumonia. 2. Additional features suggesting a degree of CHF/fluid overload with cardiomegaly, small bilateral effusions, and likely interstitial edema. Electronically Signed   By: Lovena Le M.D.   On: 07/29/2020 21:23      Assessment/Plan Active Problems:   Sickle cell anemia (HCC)   Gastroesophageal reflux disease without esophagitis   Sickle cell pain crisis ([place])   CAP (community acquired pneumonia)     #1 Healthcare associated pneumonia: Patient was in the hospital having a baby about 8 weeks ago.  Her pneumonia is lobar.  Awaiting COVID-19 screening results but at this point appears to be a  consolidative process.  Will treat with IV Levaquin.  She is afebrile.  No white count.  Follow symptoms.  Patient is breast-feeding and has therefore been advised to avoid breast-feeding during antibiotic treatment.  #2 sickle cell anemia: Patient's hemoglobin 6.3 is below her baseline.  She is not in any painful crisis but her shortness of breath and cough could be  anemia related.  I will transfuse 1 unit of packed red blood cells.  Follow H&H.  No evidence of GI bleed or any bleeding from any source  #3 sickle cell disease: Not in active pain.  Avoid narcotics at this point.  As needed oxycodone may be added.  #4 GERD: Continue home regimen.   DVT prophylaxis: Lovenox Code Status: Full code Family Communication: No family at bedside Disposition Plan: Home Consults called: None Admission status: Observation  Severity of Illness: The appropriate patient status for this patient is OBSERVATION. Observation status is judged to be reasonable and necessary in order to provide the required intensity of service to ensure the patient's safety. The patient's presenting symptoms, physical exam findings, and initial radiographic and laboratory data in the context of their medical condition is felt to place them at decreased risk for further clinical deterioration. Furthermore, it is anticipated that the patient will be medically stable for discharge from the hospital within 2 midnights of admission. The following factors support the patient status of observation.   " The patient's presenting symptoms include shortness of breath and cough. " The physical exam findings include mild pallor. " The initial radiographic and laboratory data are right lung pneumonia with low hemoglobin.     Barbette Merino MD [facility] Pager [phone]  If 7PM-7AM, please contact night-coverage [email or web address] Password [email or web address]  07/29/2020, 10:45 PM

## 2020-07-29 NOTE — Progress Notes (Signed)
Virtual Visit via Telephone Note Patient location: Home Provider location: [place] patient care center  I connected with Krista [name] on 07/29/20 at  2:20 PM EDT by telephone and verified that I am speaking with the correct person using two identifiers.   I discussed the limitations, risks, security and privacy concerns of performing an evaluation and management service by telephone and the availability of in person appointments. I also discussed with the patient that there may be a patient responsible charge related to this service. The patient expressed understanding and agreed to proceed.   History of Present Illness: Krista [name] is a very pleasant 34 year old female with a medical history significant for sickle cell disease, chronic pain, history of vitamin D deficiency presented via telephone with complaints of persistent cough, and shortness of breath.  Patient was evaluated at [facility] urgent care on yesterday and was told that she had pneumonia.  However, patient was not prescribed antibiotic, inhaler, or pain medications at that time.  Patient was advised to report for CT scan on today due to pain with inspiration.  Patient is requesting antibiotic at this time.  She endorses shortness of breath, pain with breathing, headache, and some chest pain.  She denies fever, chills, nausea, vomiting, or diarrhea.  Patient also endorses some body pain which she attributes to her history of sickle cell disease.  Of note, patient delivered a baby boy 8 weeks prior and was on Lovenox during pregnancy.   Past Medical History:  Diagnosis Date  . Chronic pain    with sickle cell   . Eczema   . Heart murmur    07-13-2019  per pt since childhood,  has not ever had a echo done ,  asymptomatic  . History of blood transfusion    x3   . Missed ab   . Sickle cell anemia ([place])    followed by pcp---  last crisis 12-14-2018  . Vitamin D deficiency   . Wears glasses    Social History    Socioeconomic History  . Marital status: Married    Spouse name: n/a  . Number of children: 0  . Years of education: 16+  . Highest education level: Not on file  Occupational History  . Occupation: Product manager: [employer]  Tobacco Use  . Smoking status: Never Smoker  . Smokeless tobacco: Never Used  Vaping Use  . Vaping Use: Never used  Substance and Sexual Activity  . Alcohol use: Not Currently    Comment: socially  . Drug use: Never  . Sexual activity: Yes    Birth control/protection: None  Other Topics Concern  . Not on file  Social History Narrative   ** Merged History Encounter **       Lives alone.  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   Social Determinants of Health   Financial Resource Strain:   . Difficulty of Paying Living Expenses: Not on file  Food Insecurity:   . Worried About Charity fundraiser in the Last Year: Not on file  . Ran Out of Food in the Last Year: Not on file  Transportation Needs:   . Lack of Transportation (Medical): Not on file  . Lack of Transportation (Non-Medical): Not on file  Physical Activity:   . Days of Exercise per Week: Not on file  . Minutes of Exercise per Session: Not on file  Stress:   .  Feeling of Stress : Not on file  Social Connections:   . Frequency of Communication with Friends and Family: Not on file  . Frequency of Social Gatherings with Friends and Family: Not on file  . Attends Religious Services: Not on file  . Active Member of Clubs or Organizations: Not on file  . Attends Archivist Meetings: Not on file  . Marital Status: Not on file  Intimate Partner Violence:   . Fear of Current or Ex-Partner: Not on file  . Emotionally Abused: Not on file  . Physically Abused: Not on file  . Sexually Abused: Not on file   Allergies  Allergen Reactions  . Keflex [Cephalexin] Swelling    Face swelling    Social History   Socioeconomic History  . Marital status: Married    Spouse name: n/a  . Number of children: 0  . Years of education: 16+  . Highest education level: Not on file  Occupational History  . Occupation: Product manager: [employer]  Tobacco Use  . Smoking status: Never Smoker  . Smokeless tobacco: Never Used  Vaping Use  . Vaping Use: Never used  Substance and Sexual Activity  . Alcohol use: Not Currently    Comment: socially  . Drug use: Never  . Sexual activity: Yes    Birth control/protection: None  Other Topics Concern  . Not on file  Social History Narrative   ** Merged History Encounter **       Lives alone.  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   Social Determinants of Health   Financial Resource Strain:   . Difficulty of Paying Living Expenses: Not on file  Food Insecurity:   . Worried About Charity fundraiser in the Last Year: Not on file  . Ran Out of Food in the Last Year: Not on file  Transportation Needs:   . Lack of Transportation (Medical): Not on file  . Lack of Transportation (Non-Medical): Not on file  Physical Activity:   . Days of Exercise per Week: Not on file  . Minutes of Exercise per Session: Not on file  Stress:   . Feeling of Stress : Not on file  Social Connections:   . Frequency of Communication with Friends and Family: Not on file  . Frequency of Social Gatherings with Friends and Family: Not on file  . Attends Religious Services: Not on file  . Active Member of Clubs or Organizations: Not on file  . Attends Archivist Meetings: Not on file  . Marital Status: Not on file  Intimate Partner Violence:   . Fear of Current or Ex-Partner: Not on file  . Emotionally Abused: Not on file  . Physically Abused: Not on file  . Sexually Abused: Not on file   Review of Systems  Constitutional: Positive for malaise/fatigue.  Negative for chills and fever.  HENT: Negative.   Eyes: Negative.   Respiratory: Positive for cough and shortness of breath.   Cardiovascular: Positive for chest pain. Negative for leg swelling.  Gastrointestinal: Negative.   Genitourinary: Negative.   Musculoskeletal: Negative.   Skin: Negative.   Neurological: Negative.   Psychiatric/Behavioral: Negative.       Assessment and Plan: Sickle-cell disease without crisis [facility]) Patient complaining of some body pain,, not consistent with previous sickle cell pain crisis.  Recommend ibuprofen 800 every 8 hours as needed.  Patient is  currently breast-feeding.  Also, patient can use Tylenol 650 mg every 4 hours interchangeably.  Recommend follow-up for sickle cell day infusion clinic for further treatment and evaluation of possible sickle cell pain crisis. - folic acid (FOLVITE) 1 MG tablet; Take 1 tablet (1 mg total) by mouth daily.  Dispense: 90 tablet; Refill: 3 - ibuprofen (ADVIL) 800 MG tablet; Take 1 tablet (800 mg total) by mouth every 8 (eight) hours as needed. (Patient taking differently: Take 800 mg by mouth every 8 (eight) hours as needed for moderate pain. )  Dispense: 60 tablet; Refill: 1  Persistent cough Patient states that she was diagnosed with pneumonia at [facility] urgent care on yesterday.  Will request medical records and review fax as it becomes available.  We will follow-up with urgent care for treatment plan.  Explained to patient that we will not be able to prescribe antibiotics prior to reviewing chest x-ray The patient was given clear instructions to go to ER or return to medical center if symptoms do not improve, worsen or new problems develop. The patient verbalized understanding.     Dyspnea, unspecified type   Chest pain varying with breathing Will review chest x-ray as results become available. The patient was given clear instructions to go to ER or return to medical center if symptoms do not improve, worsen or  new problems develop. The patient verbalized understanding.     Follow Up Instructions:    I discussed the assessment and treatment plan with the patient. The patient was provided an opportunity to ask questions and all were answered. The patient agreed with the plan and demonstrated an understanding of the instructions.   The patient was advised to call back or seek an in-person evaluation if the symptoms worsen or if the condition fails to improve as anticipated.  I provided 10 minutes of non-face-to-face time during this encounter.   [name]  APRN, MSN, FNP-C Patient [place] [address] [place], [place] [address] [phone]

## 2020-07-29 NOTE — ED Triage Notes (Signed)
Patient seen at [facility] at [facility] for chest pain and SOB. Patient was given a CXR and sent out for a Chest CT. CT showed cardiomegaly, fluid on the lungs and pneumonia. Patient hgb normally 7-8, and is currently 5.9. Patient was on lovenox during her pregnancy that she stopped 2 week ago at 6 months PPD.

## 2020-07-29 NOTE — ED Notes (Signed)
Patient has CD of her chest CT with her and her paperwork

## 2020-07-30 DIAGNOSIS — K219 Gastro-esophageal reflux disease without esophagitis: Secondary | ICD-10-CM

## 2020-07-30 DIAGNOSIS — J189 Pneumonia, unspecified organism: Secondary | ICD-10-CM

## 2020-07-30 LAB — COMPREHENSIVE METABOLIC PANEL
ALT: 24 U/L (ref 0–44)
AST: 19 U/L (ref 15–41)
Albumin: 3.7 g/dL (ref 3.5–5.0)
Alkaline Phosphatase: 77 U/L (ref 38–126)
Anion gap: 9 (ref 5–15)
BUN: <5 mg/dL — ABNORMAL LOW (ref 6–20)
CO2: 23 mmol/L (ref 22–32)
Calcium: 8.2 mg/dL — ABNORMAL LOW (ref 8.9–10.3)
Chloride: 109 mmol/L (ref 98–111)
Creatinine, Ser: 0.56 mg/dL (ref 0.44–1.00)
GFR, Estimated: >60 mL/min (ref >60)
Glucose, Bld: 79 mg/dL (ref 70–99)
Potassium: 3.2 mmol/L — ABNORMAL LOW (ref 3.5–5.1)
Sodium: 141 mmol/L (ref 135–145)
Total Bilirubin: 4.2 mg/dL — ABNORMAL HIGH (ref 0.3–1.2)
Total Protein: 6.7 g/dL (ref 6.5–8.1)

## 2020-07-30 LAB — CBC WITH DIFFERENTIAL/PLATELET
Abs Immature Granulocytes: 0.14 10*3/uL — ABNORMAL HIGH (ref 0.00–0.07)
Basophils Absolute: 0.1 10*3/uL (ref 0.0–0.1)
Basophils Relative: 1 %
Eosinophils Absolute: 0.2 10*3/uL (ref 0.0–0.5)
Eosinophils Relative: 2 %
HCT: 23.4 % — ABNORMAL LOW (ref 36.0–46.0)
Hemoglobin: 7.7 g/dL — ABNORMAL LOW (ref 12.0–15.0)
Immature Granulocytes: 2 %
Lymphocytes Relative: 29 %
Lymphs Abs: 2.4 10*3/uL (ref 0.7–4.0)
MCH: 30.4 pg (ref 26.0–34.0)
MCHC: 32.9 g/dL (ref 30.0–36.0)
MCV: 92.5 fL (ref 80.0–100.0)
Monocytes Absolute: 0.8 10*3/uL (ref 0.1–1.0)
Monocytes Relative: 9 %
Neutro Abs: 4.8 10*3/uL (ref 1.7–7.7)
Neutrophils Relative %: 57 %
Platelets: 625 10*3/uL — ABNORMAL HIGH (ref 150–400)
RBC: 2.53 MIL/uL — ABNORMAL LOW (ref 3.87–5.11)
RDW: 21.4 % — ABNORMAL HIGH (ref 11.5–15.5)
WBC: 8.3 10*3/uL (ref 4.0–10.5)
nRBC: 25.8 % — ABNORMAL HIGH (ref 0.0–0.2)

## 2020-07-30 LAB — HIV ANTIBODY (ROUTINE TESTING W REFLEX): HIV Screen 4th Generation wRfx: NONREACTIVE

## 2020-07-30 LAB — PREPARE RBC (CROSSMATCH)

## 2020-07-30 LAB — STREP PNEUMONIAE URINARY ANTIGEN: Strep Pneumo Urinary Antigen: NEGATIVE

## 2020-07-30 MED ORDER — POTASSIUM CHLORIDE CRYS ER 20 MEQ PO TBCR
20.0000 meq | EXTENDED_RELEASE_TABLET | Freq: Once | ORAL | Status: AC
  Administered 2020-07-30: 20 meq via ORAL
  Filled 2020-07-30: qty 1

## 2020-07-30 MED ORDER — ONDANSETRON HCL 4 MG/2ML IJ SOLN: 4.0000 mg | INTRAMUSCULAR | Status: DC | PRN

## 2020-07-30 MED ORDER — POTASSIUM CHLORIDE CRYS ER 20 MEQ PO TBCR
20.0000 meq | EXTENDED_RELEASE_TABLET | Freq: Every day | ORAL | Status: DC
  Administered 2020-07-31: 20 meq via ORAL
  Filled 2020-07-30 (×2): qty 1

## 2020-07-30 MED ORDER — FUROSEMIDE 20 MG PO TABS
20.0000 mg | ORAL_TABLET | Freq: Once | ORAL | Status: AC
  Administered 2020-07-30: 20 mg via ORAL
  Filled 2020-07-30: qty 1

## 2020-07-30 NOTE — Progress Notes (Signed)
Subjective: Krista [name] is a 34 year old female with a medical history significant for sickle cell disease was admitted overnight for healthcare associated pneumonia.  Patient states that she experienced sudden chest pain after lifting a 75 pound child at daycare.  Patient recently delivered a baby boy 8 weeks ago without complication.  She states that she experienced some shortness of breath and pain with inspiration and [facility].  Patient underwent chest x-ray that showed pneumonia.  Due to pain on inspiration, she was sent for CT angiogram which ruled out pulmonary embolism.  Current chest x-ray shows consolidative opacity in the right perihilar region which is concerning for pneumonia.  Additional features suggest a degree of CHF and fluid overload with cardiomegaly small bilateral effusions, and likely interstitial edema.  Also, on admission, patient's hemoglobin decreased to 6.2.  Baseline hemoglobin is 8-9 g/dL.  Patient is s/p 2 units PRBCs.   Patient is complaining of mild pain primarily to bilateral knees.  Also endorses bilateral ankle swelling.  She denies any headache, shortness of breath, dizziness, paresthesias, heart palpitations, urinary symptoms, nausea, vomiting, or diarrhea.    Objective:  Vital signs in last 24 hours:  Vitals:   07/30/20 0748 07/30/20 0823 07/30/20 0900 07/30/20 1000  BP: (!) 143/62 (!) 136/93 129/80 130/87  Pulse: (!) 50 70 80 80  Resp: 18 (!) 24 12 16   Temp:      TempSrc:      SpO2: 99% 92% 100% 95%  Weight:      Height:        Intake/Output from previous day:   Intake/Output Summary (Last 24 hours) at 07/30/2020 1053 Last data filed at 07/30/2020 0515 Gross per 24 hour  Intake 1253 ml  Output --  Net 1253 ml    Physical Exam: General: Alert, awake, oriented x3, in no acute distress.  HEENT: [place]/AT PEERL, EOMI Neck: Trachea midline,  no masses, no thyromegal,y no JVD, no carotid bruit OROPHARYNX:  Moist, No  exudate/ erythema/lesions.  Heart: Regular rate and rhythm, without murmurs, rubs, gallops, PMI non-displaced, no heaves or thrills on palpation.  Lungs: Clear to auscultation, no wheezing or rhonchi noted. No increased vocal fremitus resonant to percussion  Abdomen: Soft, nontender, nondistended, positive bowel sounds, no masses no hepatosplenomegaly noted..  Neuro: No focal neurological deficits noted cranial nerves II through XII grossly intact. DTRs 2+ bilaterally upper and lower extremities. Strength 5 out of 5 in bilateral upper and lower extremities. Musculoskeletal: No warm swelling or erythema around joints, no spinal tenderness noted. Psychiatric: Patient alert and oriented x3, good insight and cognition, good recent to remote recall. Lymph node survey: No cervical axillary or inguinal lymphadenopathy noted.  Lab Results:  Basic Metabolic Panel:    Component Value Date/Time   NA 141 07/30/2020 0607   NA 139 08/21/2019 0956   K 3.2 (L) 07/30/2020 0607   CL 109 07/30/2020 0607   CO2 23 07/30/2020 0607   BUN <5 (L) 07/30/2020 0607   BUN 5 (L) 08/21/2019 0956   CREATININE 0.56 07/30/2020 0607   CREATININE 0.51 03/24/2017 1501   GLUCOSE 79 07/30/2020 0607   CALCIUM 8.2 (L) 07/30/2020 0607   CBC:    Component Value Date/Time   WBC 8.3 07/30/2020 0607   HGB 7.7 (L) 07/30/2020 0607   HGB 8.6 (L) 02/26/2020 1524   HCT 23.4 (L) 07/30/2020 0607   HCT 24.3 (L) 02/26/2020 1524   PLT 625 (H) 07/30/2020 0607   PLT  703 (H) 02/26/2020 1524   MCV 92.5 07/30/2020 0607   MCV 96 02/26/2020 1524   NEUTROABS 4.8 07/30/2020 0607   NEUTROABS 9.5 (H) 02/26/2020 1524   LYMPHSABS 2.4 07/30/2020 0607   LYMPHSABS 3.7 (H) 02/26/2020 1524   MONOABS 0.8 07/30/2020 0607   EOSABS 0.2 07/30/2020 0607   EOSABS 0.0 02/26/2020 1524   BASOSABS 0.1 07/30/2020 0607   BASOSABS 0.1 02/26/2020 1524    Recent Results (from the past 240 hour(s))  Resp Panel by RT PCR (RSV, Flu A&B, Covid) -  Nasopharyngeal Swab     Status: None   Collection Time: 07/29/20  8:54 PM   Specimen: Nasopharyngeal Swab  Result Value Ref Range Status   SARS Coronavirus 2 by RT PCR NEGATIVE NEGATIVE Final    Comment: (NOTE) SARS-CoV-2 target nucleic acids are NOT DETECTED.  The SARS-CoV-2 RNA is generally detectable in upper respiratoy specimens during the acute phase of infection. The lowest concentration of SARS-CoV-2 viral copies this assay can detect is 131 copies/mL. A negative result does not preclude SARS-Cov-2 infection and should not be used as the sole basis for treatment or other patient management decisions. A negative result may occur with  improper specimen collection/handling, submission of specimen other than nasopharyngeal swab, presence of viral mutation(s) within the areas targeted by this assay, and inadequate number of viral copies (<131 copies/mL). A negative result must be combined with clinical observations, patient history, and epidemiological information. The expected result is Negative.  Fact Sheet for Patients:  PinkCheek.be  Fact Sheet for Healthcare Providers:  GravelBags.it  This test is no t yet approved or cleared by the [place] FDA and  has been authorized for detection and/or diagnosis of SARS-CoV-2 by FDA under an Emergency Use Authorization (EUA). This EUA will remain  in effect (meaning this test can be used) for the duration of the COVID-19 declaration under Section 564(b)(1) of the Act, 21 U.S.C. section 360bbb-3(b)(1), unless the authorization is terminated or revoked sooner.     Influenza A by PCR NEGATIVE NEGATIVE Final   Influenza B by PCR NEGATIVE NEGATIVE Final    Comment: (NOTE) The Xpert Xpress SARS-CoV-2/FLU/RSV assay is intended as an aid in  the diagnosis of influenza from Nasopharyngeal swab specimens and  should not be used as a sole basis for treatment. Nasal washings and   aspirates are unacceptable for Xpert Xpress SARS-CoV-2/FLU/RSV  testing.  Fact Sheet for Patients: PinkCheek.be  Fact Sheet for Healthcare Providers: GravelBags.it  This test is not yet approved or cleared by the [place] FDA and  has been authorized for detection and/or diagnosis of SARS-CoV-2 by  FDA under an Emergency Use Authorization (EUA). This EUA will remain  in effect (meaning this test can be used) for the duration of the  Covid-19 declaration under Section 564(b)(1) of the Act, 21  U.S.C. section 360bbb-3(b)(1), unless the authorization is  terminated or revoked.    Respiratory Syncytial Virus by PCR NEGATIVE NEGATIVE Final    Comment: (NOTE) Fact Sheet for Patients: PinkCheek.be  Fact Sheet for Healthcare Providers: GravelBags.it  This test is not yet approved or cleared by the [place] FDA and  has been authorized for detection and/or diagnosis of SARS-CoV-2 by  FDA under an Emergency Use Authorization (EUA). This EUA will remain  in effect (meaning this test can be used) for the duration of the  COVID-19 declaration under Section 564(b)(1) of the Act, 21 U.S.C.  section 360bbb-3(b)(1), unless the authorization is terminated or  revoked. Performed at [facility], [place] [address] [place], [place] [address]     Studies/Results: DG Chest Port 1 View  Result Date: 07/29/2020 CLINICAL DATA:  Concern for pneumonia EXAM: PORTABLE CHEST 1 VIEW COMPARISON:  Radiograph 02/05/2013, CT 02/05/2013 FINDINGS: Consolidative opacity present in the right perihilar region. Suspect small bilateral effusions. Some more diffuse hazy interstitial opacity with central vascular congestion, cuffing and cardiomegaly could reflect a degree of pulmonary edema with CHF/fluid overload. No pneumothorax. No acute osseous or soft tissue abnormality.  Telemetry leads overlie the chest. IMPRESSION: 1. Consolidative opacity in the right perihilar region, concerning for pneumonia. 2. Additional features suggesting a degree of CHF/fluid overload with cardiomegaly, small bilateral effusions, and likely interstitial edema. Electronically Signed   By: Lovena Le M.D.   On: 07/29/2020 21:23    Medications: Scheduled Meds: . enoxaparin (LOVENOX) injection  40 mg Subcutaneous Q24H  . folic acid  1 mg Oral Daily  . ketorolac  1 drop Left Eye QID  . ketorolac  15 mg Intravenous Q6H  . potassium chloride SA  20 mEq Oral Once  . prednisoLONE acetate  1 drop Left Eye QID  . prenatal multivitamin  1 tablet Oral Q1200  . senna-docusate  1 tablet Oral BID   Continuous Infusions: . levofloxacin (LEVAQUIN) IV     PRN Meds:.ondansetron (ZOFRAN) IV, oxyCODONE, polyethylene glycol  Consultants:  None  Procedures:  None  Antibiotics:  IV levaquin   Assessment/Plan: Principal Problem:   CAP (community acquired pneumonia) Active Problems:   Sickle cell anemia (HCC)   Gastroesophageal reflux disease without esophagitis   Sickle cell pain crisis ([place])  Healthcare associated pneumonia: Patient is afebrile.  IV Levaquin continued.  COVID-19 test negative.  White count is within normal limits.  Sickle cell anemia: Hemoglobin is improved to 7.7, which is slightly decreased from baseline.  She is s/p 2 units PRBCs.  Follow CBC in a.m.  Sickle cell disease: Patient does not appear to be in pain crisis.  Oxycodone 5 mg every 4 hours as needed for any breakthrough pain.  GERD, continue home medication regimen  Hypokalemia: Replete.  Repeat potassium level in a.m.  Code Status: Full Code Family Communication: N/A Disposition Plan: Not yet ready for discharge  [place], MSN, FNP-C [facility] [place] [facility] [address] [place], [place] [address] [phone]  If 5PM-8AM, please contact  night-coverage.  07/30/2020, 10:53 AM  LOS: 1 day

## 2020-07-30 NOTE — ED Notes (Signed)
Assumed care of patient at this time, nad noted, sr up x2, bed locked and low, call bell w/I reach.  Will continue to monitor.  Appears to be resting well with eyes closed, respirations e/u, lights off.

## 2020-07-31 DIAGNOSIS — D5700 Hb-SS disease with crisis, unspecified: Secondary | ICD-10-CM

## 2020-07-31 LAB — TYPE AND SCREEN
ABO/RH(D): B POS
Antibody Screen: NEGATIVE
Unit division: 0
Unit division: 0

## 2020-07-31 LAB — CBC
HCT: 23.2 % — ABNORMAL LOW (ref 36.0–46.0)
Hemoglobin: 7.7 g/dL — ABNORMAL LOW (ref 12.0–15.0)
MCH: 30.8 pg (ref 26.0–34.0)
MCHC: 33.2 g/dL (ref 30.0–36.0)
MCV: 92.8 fL (ref 80.0–100.0)
Platelets: 594 10*3/uL — ABNORMAL HIGH (ref 150–400)
RBC: 2.5 MIL/uL — ABNORMAL LOW (ref 3.87–5.11)
RDW: 21.5 % — ABNORMAL HIGH (ref 11.5–15.5)
WBC: 10.3 10*3/uL (ref 4.0–10.5)
nRBC: 19.7 % — ABNORMAL HIGH (ref 0.0–0.2)

## 2020-07-31 LAB — BPAM RBC
Blood Product Expiration Date: 202111152359
Blood Product Expiration Date: 202111182359
ISSUE DATE / TIME: 202110122257
ISSUE DATE / TIME: 202110130232
Unit Type and Rh: 7300
Unit Type and Rh: 7300

## 2020-07-31 LAB — BASIC METABOLIC PANEL
Anion gap: 10 (ref 5–15)
BUN: 7 mg/dL (ref 6–20)
CO2: 24 mmol/L (ref 22–32)
Calcium: 9 mg/dL (ref 8.9–10.3)
Chloride: 111 mmol/L (ref 98–111)
Creatinine, Ser: 0.7 mg/dL (ref 0.44–1.00)
GFR, Estimated: >60 mL/min (ref >60)
Glucose, Bld: 84 mg/dL (ref 70–99)
Potassium: 3.8 mmol/L (ref 3.5–5.1)
Sodium: 145 mmol/L (ref 135–145)

## 2020-07-31 LAB — LEGIONELLA PNEUMOPHILA SEROGP 1 UR AG: L. pneumophila Serogp 1 Ur Ag: NEGATIVE

## 2020-07-31 MED ORDER — GUAIFENESIN-DM 100-10 MG/5ML PO SYRP: 5.0000 mL | ORAL_SOLUTION | ORAL | Status: DC | PRN

## 2020-07-31 MED ORDER — BENZONATATE 100 MG PO CAPS: 100.0000 mg | ORAL_CAPSULE | Freq: Three times a day (TID) | ORAL | 0 refills | Status: DC | PRN

## 2020-07-31 MED ORDER — OXYCODONE HCL 5 MG PO CAPS: 5.0000 mg | ORAL_CAPSULE | ORAL | 0 refills | Status: DC | PRN

## 2020-07-31 MED ORDER — LEVOFLOXACIN 750 MG PO TABS: 750.0000 mg | ORAL_TABLET | Freq: Every day | ORAL | 0 refills | Status: AC

## 2020-07-31 NOTE — Discharge Instructions (Signed)
Community-Acquired Pneumonia, Adult Pneumonia is an infection of the lungs. It causes swelling in the airways of the lungs. Mucus and fluid may also build up inside the airways. One type of pneumonia can happen while a person is in a hospital. A different type can happen when a person is not in a hospital (community-acquired pneumonia).  What are the causes?  This condition is caused by germs (viruses, bacteria, or fungi). Some types of germs can be passed from one person to another. This can happen when you breathe in droplets from the cough or sneeze of an infected person. What increases the risk? You are more likely to develop this condition if you:  Have a long-term (chronic) disease, such as: ? Chronic obstructive pulmonary disease (COPD). ? Asthma. ? Cystic fibrosis. ? Congestive heart failure. ? Diabetes. ? Kidney disease.  Have HIV.  Have sickle cell disease.  Have had your spleen removed.  Do not take good care of your teeth and mouth (poor dental hygiene).  Have a medical condition that increases the risk of breathing in droplets from your own mouth and nose.  Have a weakened body defense system (immune system).  Are a smoker.  Travel to areas where the germs that cause this illness are common.  Are around certain animals or the places they live. What are the signs or symptoms?  A dry cough.  A wet (productive) cough.  Fever.  Sweating.  Chest pain. This often happens when breathing deeply or coughing.  Fast breathing or trouble breathing.  Shortness of breath.  Shaking chills.  Feeling tired (fatigue).  Muscle aches. How is this treated? Treatment for this condition depends on many things. Most adults can be treated at home. In some cases, treatment must happen in a hospital. Treatment may include:  Medicines given by mouth or through an IV tube.  Being given extra oxygen.  Respiratory therapy. In rare cases, treatment for very bad pneumonia  may include:  Using a machine to help you breathe.  Having a procedure to remove fluid from around your lungs. Follow these instructions at home: Medicines  Take over-the-counter and prescription medicines only as told by your doctor. ? Only take cough medicine if you are losing sleep.  If you were prescribed an antibiotic medicine, take it as told by your doctor. Do not stop taking the antibiotic even if you start to feel better. General instructions   Sleep with your head and neck raised (elevated). You can do this by sleeping in a recliner or by putting a few pillows under your head.  Rest as needed. Get at least 8 hours of sleep each night.  Drink enough water to keep your pee (urine) pale yellow.  Eat a healthy diet that includes plenty of vegetables, fruits, whole grains, low-fat dairy products, and lean protein.  Do not use any products that contain nicotine or tobacco. These include cigarettes, e-cigarettes, and chewing tobacco. If you need help quitting, ask your doctor.  Keep all follow-up visits as told by your doctor. This is important. How is this prevented? A shot (vaccine) can help prevent pneumonia. Shots are often suggested for:  People older than 33 years of age.  People older than 33 years of age who: ? Are having cancer treatment. ? Have long-term (chronic) lung disease. ? Have problems with their body's defense system. You may also prevent pneumonia if you take these actions:  Get the flu (influenza) shot every year.  Go to the dentist as   often as told.  Wash your hands often. If you cannot use soap and water, use hand sanitizer. Contact a doctor if:  You have a fever.  You lose sleep because your cough medicine does not help. Get help right away if:  You are short of breath and it gets worse.  You have more chest pain.  Your sickness gets worse. This is very serious if: ? You are an older adult. ? Your body's defense system is weak.  You  cough up blood. Summary  Pneumonia is an infection of the lungs.  Most adults can be treated at home. Some will need treatment in a hospital.  Drink enough water to keep your pee pale yellow.  Get at least 8 hours of sleep each night. This information is not intended to replace advice given to you by your health care provider. Make sure you discuss any questions you have with your health care provider. Document Revised: 01/24/2019 Document Reviewed: 06/01/2018 Elsevier Patient Education  2020 [employer]  

## 2020-07-31 NOTE — Progress Notes (Signed)
Patient resting in bed, independent in the room, occasional dry cough which causes her pain in her upper chest, scheduled toradol managing pain well, abd obese, soft (8 weeks post partum), lungs diminished but clear, gave and taught incentive spirometer- encouraged use, vss, will continue to monitor.

## 2020-07-31 NOTE — Discharge Summary (Signed)
Physician Discharge Summary  Krista [name] IRJ:[id number] DOB: October 20, 1985 DOA: 07/29/2020  PCP: Dorena Dew, FNP  Admit date: 07/29/2020  Discharge date: 08/01/2020  Discharge Diagnoses:  Principal Problem:   Healthcare-associated pneumonia Active Problems:   Sickle cell anemia (HCC)   Gastroesophageal reflux disease without esophagitis   Sickle cell pain crisis [facility])   Discharge Condition: Stable  Disposition:   Follow-up Information    Dorena Dew, FNP Follow up in 1 week(s).   Specialty: Family Medicine Contact information: [place]. [place] [address] [phone]              Pt is discharged home in good condition and is to follow up with Dorena Dew, FNP this week to have labs evaluated. Krista [name] is instructed to increase activity slowly and balance with rest for the next few days, and use prescribed medication to complete treatment of pain  Diet: Regular Wt Readings from Last 3 Encounters:  07/29/20 72.6 kg  06/03/20 73 kg  02/19/20 74.4 kg    History of present illness:  Krista [name] is a 34 year old female with a medical history significant for sickle cell disease presented to ER with significant cough, mild shortness of breath, as well as fatigue.  Patient's baseline hemoglobin is between 7-8 g/dL.  Patient recently had childbirth around 8 weeks prior.  Patient was initially seen at urgent care center yesterday with a had a CT scan with findings on x-ray and CT showed right lower lobe pneumonia.  It appears to be a lobar pneumonia.  She also has some weakness and a decrease in her hemoglobin.  Hemoglobin is currently 6.3, which is down from her baseline.  She is not in sickle cell crisis at the moment.  She denies any significant pain except at the hip site.  She also reported recurrent headaches over the past several days.  Patient being admitted with what appears to be lobar pneumonia, probably healthcare associated  pneumonia since this is within the last 3 months.  Also possibly a hemolytic pain crisis.  ER course: Temperature 98.7 F, BP 130/85, pulse 110, respirations 23, oxygen saturation 92% on RA.  WBCs 10.1, hemoglobin 6.3, and platelets 752.  Sodium 143, potassium 3.6, chloride 108, CO2 25, BUN less than 5, creatinine 0.5, and calcium 8.5.  Glucose of 84.  Chest x-ray show consolidated opacity in the right perihilar region.  Possible CHF with fluid overload.  Outside CT showed no PE, but confirmed consolidative pneumonia.  Patient is being admitted to the hospital for further evaluation and treatment.  Hospital Course:   Healthcare associated pneumonia: Patient initially seen in urgent care on 07/29/2020.  Initial symptoms included shortness of breath, fatigue, and chest pain.  CT showed consolidative right lower lobe pneumonia.  Patient admitted and IV levofloxacin was initiated.  Patient will discharge home with levofloxacin 750 mg for an additional 5 days.  She will follow-up with PCP and have chest x-ray repeated in 4 weeks.  Patient is currently breast-feeding, advised to refrain from breast-feeding while taking levofloxacin, patient expressed understanding.  Also, recommend continuing incentive spirometry hourly while awake.  Symptomatic anemia: Hemoglobin decreased to 6.3, which was below patient's baseline.  Patient was transfused 2 units of PRBCs.  Hemoglobin returned to 7.7 prior to discharge.  Patient's baseline is 7-8 g/dL.  Patient will follow-up with PCP on 08/05/2020 to repeat CBC with differential at that time. Patient was admitted for sickle cell pain crisis and  managed appropriately with IVF, IV Dilaudid via PCA and IV Toradol, as well as other adjunct therapies per sickle cell pain management protocols.  Sickle cell anemia: Patient did not appear to be in sickle cell crisis.  She is having some pain primarily to bilateral hips and lower extremities.  Oxycodone 5 mg every 6 hours as  needed #30 was sent to patient's pharmacy.  Reviewed PDMP prior to prescribing opiate medications, no inconsistencies noted.   Patient was therefore discharged home today in a hemodynamically stable condition.   Krista [name] will follow-up with PCP within 1 week of this discharge. Krista [name] was counseled extensively about nonpharmacologic means of pain management, patient verbalized understanding and was appreciative of  the care received during this admission.   We discussed the need for good hydration, monitoring of hydration status, avoidance of heat, cold, stress, and infection triggers.  Patient advised to continue folic acid 1 mg daily.  She has well-controlled sickle cell disease and does not warrant disease modifying agents.. Patient was reminded of the need to seek medical attention immediately if any symptom of bleeding, anemia, or infection occurs.  Krista [name] is aware of all upcoming appointments.  Discharge Exam: Vitals:   07/31/20 0430 07/31/20 1420  BP: (!) 133/91 (!) 146/88  Pulse: 76 (!) 57  Resp:  15  Temp: (!) 97.2 F (36.2 C) 97.9 F (36.6 C)  SpO2: 94% 95%   Vitals:   07/30/20 2100 07/31/20 0130 07/31/20 0430 07/31/20 1420  BP: (!) 136/94 131/88 (!) 133/91 (!) 146/88  Pulse: 72 76 76 (!) 57  Resp:    15  Temp:  (!) 97.5 F (36.4 C) (!) 97.2 F (36.2 C) 97.9 F (36.6 C)  TempSrc:  Oral Oral Oral  SpO2: 95% 96% 94% 95%  Weight:      Height:        General appearance : Awake, alert, not in any distress. Speech Clear. Not toxic looking HEENT: Atraumatic and Normocephalic, pupils equally reactive to light and accomodation Neck: Supple, no JVD. No cervical lymphadenopathy.  Chest: Good air entry bilaterally, no added sounds  CVS: S1 S2 regular, no murmurs.  Abdomen: Bowel sounds present, Non tender and not distended with no gaurding, rigidity or rebound. Extremities: B/L Lower Ext shows no edema, both legs are warm to touch Neurology: Awake alert, and oriented X 3, CN  II-XII intact, Non focal Skin: No Rash  Discharge Instructions  Discharge Instructions    Discharge patient   Complete by: As directed    Discharge disposition: 01-Home or Self Care   Discharge patient date: 07/31/2020     Allergies as of 07/31/2020      Reactions   Keflex [cephalexin] Swelling   Face swelling      Medication List    TAKE these medications   benzonatate 100 MG capsule Commonly known as: Tessalon Perles Take 1 capsule (100 mg total) by mouth 3 (three) times daily as needed for cough.   cetirizine 10 MG tablet Commonly known as: ZYRTEC Take 1 tablet by mouth once daily   enoxaparin 40 MG/0.4ML injection Commonly known as: LOVENOX Inject 0.4 mLs (40 mg total) into the skin daily.   folic acid 1 MG tablet Commonly known as: FOLVITE Take 1 tablet (1 mg total) by mouth daily.   ibuprofen 800 MG tablet Commonly known as: ADVIL Take 1 tablet (800 mg total) by mouth every 8 (eight) hours as needed. What changed: reasons to take this   ketorolac 0.5 % ophthalmic  solution Commonly known as: ACULAR Place 1 drop into the left eye 4 (four) times daily.   levofloxacin 750 MG tablet Commonly known as: Levaquin Take 1 tablet (750 mg total) by mouth daily for 6 days.   metoCLOPramide 10 MG tablet Commonly known as: REGLAN Take 1 tablet (10 mg total) by mouth every 6 (six) hours.   mometasone 0.1 % cream Commonly known as: ELOCON Apply 1 application topically as needed.   oxycodone 5 MG capsule Commonly known as: OXY-IR Take 1 capsule (5 mg total) by mouth every 4 (four) hours as needed.   prednisoLONE acetate 1 % ophthalmic suspension Commonly known as: PRED FORTE Place 1 drop into the left eye 4 (four) times daily.   prenatal multivitamin Tabs tablet Take 1 tablet by mouth daily at 12 noon.   Prolensa 0.07 % Soln Generic drug: Bromfenac Sodium Place 1 drop into the left eye 4 (four) times daily.   Vitamin D3 25 MCG (1000 UT) Caps TAKE 1  CAPSULE BY MOUTH ONCE DAILY       The results of significant diagnostics from this hospitalization (including imaging, microbiology, ancillary and laboratory) are listed below for reference.    Significant Diagnostic Studies: DG Chest Port 1 View  Result Date: 07/29/2020 CLINICAL DATA:  Concern for pneumonia EXAM: PORTABLE CHEST 1 VIEW COMPARISON:  Radiograph 02/05/2013, CT 02/05/2013 FINDINGS: Consolidative opacity present in the right perihilar region. Suspect small bilateral effusions. Some more diffuse hazy interstitial opacity with central vascular congestion, cuffing and cardiomegaly could reflect a degree of pulmonary edema with CHF/fluid overload. No pneumothorax. No acute osseous or soft tissue abnormality. Telemetry leads overlie the chest. IMPRESSION: 1. Consolidative opacity in the right perihilar region, concerning for pneumonia. 2. Additional features suggesting a degree of CHF/fluid overload with cardiomegaly, small bilateral effusions, and likely interstitial edema. Electronically Signed   By: Lovena Le M.D.   On: 07/29/2020 21:23    Microbiology: Recent Results (from the past 240 hour(s))  Resp Panel by RT PCR (RSV, Flu A&B, Covid) - Nasopharyngeal Swab     Status: None   Collection Time: 07/29/20  8:54 PM   Specimen: Nasopharyngeal Swab  Result Value Ref Range Status   SARS Coronavirus 2 by RT PCR NEGATIVE NEGATIVE Final    Comment: (NOTE) SARS-CoV-2 target nucleic acids are NOT DETECTED.  The SARS-CoV-2 RNA is generally detectable in upper respiratoy specimens during the acute phase of infection. The lowest concentration of SARS-CoV-2 viral copies this assay can detect is 131 copies/mL. A negative result does not preclude SARS-Cov-2 infection and should not be used as the sole basis for treatment or other patient management decisions. A negative result may occur with  improper specimen collection/handling, submission of specimen other than nasopharyngeal swab,  presence of viral mutation(s) within the areas targeted by this assay, and inadequate number of viral copies (<131 copies/mL). A negative result must be combined with clinical observations, patient history, and epidemiological information. The expected result is Negative.  Fact Sheet for Patients:  PinkCheek.be  Fact Sheet for Healthcare Providers:  GravelBags.it  This test is no t yet approved or cleared by the [place] FDA and  has been authorized for detection and/or diagnosis of SARS-CoV-2 by FDA under an Emergency Use Authorization (EUA). This EUA will remain  in effect (meaning this test can be used) for the duration of the COVID-19 declaration under Section 564(b)(1) of the Act, 21 U.S.C. section 360bbb-3(b)(1), unless the authorization is terminated or revoked sooner.  Influenza A by PCR NEGATIVE NEGATIVE Final   Influenza B by PCR NEGATIVE NEGATIVE Final    Comment: (NOTE) The Xpert Xpress SARS-CoV-2/FLU/RSV assay is intended as an aid in  the diagnosis of influenza from Nasopharyngeal swab specimens and  should not be used as a sole basis for treatment. Nasal washings and  aspirates are unacceptable for Xpert Xpress SARS-CoV-2/FLU/RSV  testing.  Fact Sheet for Patients: PinkCheek.be  Fact Sheet for Healthcare Providers: GravelBags.it  This test is not yet approved or cleared by the [place] FDA and  has been authorized for detection and/or diagnosis of SARS-CoV-2 by  FDA under an Emergency Use Authorization (EUA). This EUA will remain  in effect (meaning this test can be used) for the duration of the  Covid-19 declaration under Section 564(b)(1) of the Act, 21  U.S.C. section 360bbb-3(b)(1), unless the authorization is  terminated or revoked.    Respiratory Syncytial Virus by PCR NEGATIVE NEGATIVE Final    Comment: (NOTE) Fact Sheet for  Patients: PinkCheek.be  Fact Sheet for Healthcare Providers: GravelBags.it  This test is not yet approved or cleared by the [place] FDA and  has been authorized for detection and/or diagnosis of SARS-CoV-2 by  FDA under an Emergency Use Authorization (EUA). This EUA will remain  in effect (meaning this test can be used) for the duration of the  COVID-19 declaration under Section 564(b)(1) of the Act, 21 U.S.C.  section 360bbb-3(b)(1), unless the authorization is terminated or  revoked. Performed at [facility], [place] [address], [place], [place] [address]      Labs: Basic Metabolic Panel: Recent Labs  Lab 07/29/20 2031 07/29/20 2031 07/30/20 0607 07/31/20 0552  NA 143  --  141 145  K 3.6   < > 3.2* 3.8  CL 108  --  109 111  CO2 25  --  23 24  GLUCOSE 84  --  79 84  BUN <5*  --  <5* 7  CREATININE 0.58  --  0.56 0.70  CALCIUM 8.5*  --  8.2* 9.0   < > = values in this interval not displayed.   Liver Function Tests: Recent Labs  Lab 07/29/20 2031 07/30/20 0607  AST 22 19  ALT 25 24  ALKPHOS 89 77  BILITOT 4.4* 4.2*  PROT 7.4 6.7  ALBUMIN 4.1 3.7   No results for input(s): LIPASE, AMYLASE in the last 168 hours. No results for input(s): AMMONIA in the last 168 hours. CBC: Recent Labs  Lab 07/29/20 2031 07/30/20 0607 07/31/20 0552  WBC 10.0 8.3 10.3  NEUTROABS 6.2 4.8  --   HGB 6.3* 7.7* 7.7*  HCT 19.5* 23.4* 23.2*  MCV 99.0 92.5 92.8  PLT 752* 625* 594*   Cardiac Enzymes: No results for input(s): CKTOTAL, CKMB, CKMBINDEX, TROPONINI in the last 168 hours. BNP: Invalid input(s): POCBNP CBG: No results for input(s): GLUCAP in the last 168 hours.  Time coordinating discharge: 35 minutes  Signed:  [name]  APRN, MSN, FNP-C Patient [place], [place] [address] [phone]  [facility] 08/01/2020, 1:45 PM

## 2020-08-05 ENCOUNTER — Other Ambulatory Visit: Payer: Self-pay

## 2020-08-05 ENCOUNTER — Other Ambulatory Visit: Payer: Self-pay | Admitting: Family Medicine

## 2020-08-05 ENCOUNTER — Other Ambulatory Visit: Payer: BC Managed Care – PPO

## 2020-08-05 DIAGNOSIS — D5700 Hb-SS disease with crisis, unspecified: Secondary | ICD-10-CM

## 2020-08-05 DIAGNOSIS — E559 Vitamin D deficiency, unspecified: Secondary | ICD-10-CM

## 2020-08-05 NOTE — Progress Notes (Signed)
Orders Placed This Encounter  Procedures  . CBC with Differential    Standing Status:   Future    Standing Expiration Date:   08/05/2021    [name]  APRN, MSN, FNP-C Patient [place] [address] [place], [place] [address] [phone]

## 2020-08-06 LAB — CBC WITH DIFFERENTIAL/PLATELET
Basophils Absolute: 0.1 10*3/uL (ref 0.0–0.2)
Basos: 1 %
EOS (ABSOLUTE): 0.1 10*3/uL (ref 0.0–0.4)
Eos: 1 %
Hematocrit: 26.4 % — ABNORMAL LOW (ref 34.0–46.6)
Hemoglobin: 8.8 g/dL — ABNORMAL LOW (ref 11.1–15.9)
Immature Grans (Abs): 0.1 10*3/uL (ref 0.0–0.1)
Immature Granulocytes: 1 %
Lymphocytes Absolute: 2.3 10*3/uL (ref 0.7–3.1)
Lymphs: 28 %
MCH: 30 pg (ref 26.6–33.0)
MCHC: 33.3 g/dL (ref 31.5–35.7)
MCV: 90 fL (ref 79–97)
Monocytes Absolute: 0.6 10*3/uL (ref 0.1–0.9)
Monocytes: 7 %
NRBC: 2 % — ABNORMAL HIGH (ref 0–0)
Neutrophils Absolute: 5.1 10*3/uL (ref 1.4–7.0)
Neutrophils: 62 %
Platelets: 886 10*3/uL (ref 150–450)
RBC: 2.93 x10E6/uL — ABNORMAL LOW (ref 3.77–5.28)
RDW: 15.7 % — ABNORMAL HIGH (ref 11.7–15.4)
WBC: 8.1 10*3/uL (ref 3.4–10.8)

## 2020-08-07 ENCOUNTER — Telehealth: Payer: Self-pay

## 2020-08-07 NOTE — Telephone Encounter (Signed)
-----   Message from Dorena Dew, [place] sent at 08/07/2020 11:19 AM EDT ----- Regarding: lab results Please inform patient that hemoglobin has returned to 8.8, which is consistent with her baseline. No further blood transfusions warranted at this time. Platelet count is elevated at 886, will start Aspirin 81 mg daily. Will recheck platelet count at scheduled follow up. Will also complete a chest xray on that day.   [name]  APRN, MSN, FNP-C Patient [place] [address] [place], [place] [address] [phone]

## 2020-08-07 NOTE — Telephone Encounter (Signed)
Spoke with patient about labs 

## 2020-08-25 NOTE — Progress Notes (Signed)
[facility] Note  08/27/2020     CHIEF COMPLAINT Patient presents for Retina Follow Up   HISTORY OF PRESENT ILLNESS: Krista [name] is a 34 y.o. female who presents to the clinic today for:   HPI    Retina Follow Up    Patient presents with  Other.  In left eye.  This started months ago.  Severity is moderate.  Duration of 6 months.  Since onset it is gradually worsening.  I, the attending physician,  performed the HPI with the patient and updated documentation appropriately.          Comments    34 y/o female pt here for 6 mo f/u for peripheral chorioretinal inflammation and vasculitis OS.  Feels VA OS has been gradually getting worse over the past month or so.  No change in [place] OD.  Has an appt with her optometrist in January 2022.  Denies pain, FOL, floaters.  Has seen Dr. Manuella Ghazi since her last visit here, and he gave her one injection OS, but she isn't sure what kind of medication it was.  PF and Prolensa QID OS.  Is nearly out of PF.       Last edited by Bernarda Caffey, MD on 08/27/2020  8:50 AM. (History)    Pt saw Dr. Manuella Ghazi on 10.05.21 and received IVO OS, pt states in the last month or 2, she feels like her left eye is affecting her overall vision, she states she has to wear her glasses more often especially while driving  Referring physician: Murphy, [place] OD [place], [place] [address]   HISTORICAL INFORMATION:   Selected notes from the [place] Referral from Dr. [place] DeMarco for macular edema OS  LEE: 11.18.20, BCVA OD: 20/20 OS: 20/30+2  PMH: sickle cell disease, chronic pain, heart murmer, hx of retinal vasculitits    CURRENT MEDICATIONS: Current Outpatient Medications (Ophthalmic Drugs)  Medication Sig  . Bromfenac Sodium (PROLENSA) 0.07 % SOLN Place 1 drop into the left eye 4 (four) times daily.  . prednisoLONE acetate (PRED FORTE) 1 % ophthalmic suspension Place 1 drop into the left eye 4  (four) times daily.  . dorzolamide-timolol (COSOPT) 22.3-6.8 MG/ML ophthalmic solution Place 1 drop into the left eye 2 (two) times daily.  Marland Kitchen ketorolac (ACULAR) 0.5 % ophthalmic solution Place 1 drop into the left eye 4 (four) times daily. (Patient not taking: Reported on 08/27/2020)   No current facility-administered medications for this visit. (Ophthalmic Drugs)   Current Outpatient Medications (Other)  Medication Sig  . Cholecalciferol (VITAMIN D3) 25 MCG (1000 UT) CAPS TAKE 1 CAPSULE BY MOUTH ONCE DAILY  . folic acid (FOLVITE) 1 MG tablet Take 1 tablet (1 mg total) by mouth daily.  Marland Kitchen ibuprofen (ADVIL) 800 MG tablet Take 1 tablet (800 mg total) by mouth every 8 (eight) hours as needed. (Patient taking differently: Take 800 mg by mouth every 8 (eight) hours as needed for moderate pain. )  . mometasone (ELOCON) 0.1 % cream Apply 1 application topically as needed.  Marland Kitchen oxycodone (OXY-IR) 5 MG capsule Take 1 capsule (5 mg total) by mouth every 4 (four) hours as needed.  . Prenatal Vit-Fe Fumarate-FA (PRENATAL MULTIVITAMIN) TABS tablet Take 1 tablet by mouth daily at 12 noon. (Patient not taking: Reported on 08/27/2020)   No current facility-administered medications for this visit. (Other)      REVIEW OF SYSTEMS: ROS    Positive for: Gastrointestinal, Cardiovascular,  Eyes   Negative for: Constitutional, Neurological, Skin, Genitourinary, Musculoskeletal, HENT, Endocrine, Respiratory, Psychiatric, Allergic/Imm, Heme/Lymph   Last edited by Matthew Folks, COA on 08/27/2020  8:27 AM. (History)       ALLERGIES Allergies  Allergen Reactions  . Keflex [Cephalexin] Swelling    Face swelling    PAST MEDICAL HISTORY Past Medical History:  Diagnosis Date  . Chronic pain    with sickle cell   . Eczema   . Heart murmur    07-13-2019  per pt since childhood,  has not ever had a echo done ,  asymptomatic  . History of blood transfusion    x3   . Missed ab   . Sickle cell anemia ([place])     followed by pcp---  last crisis 12-14-2018  . Vitamin D deficiency   . Wears glasses    Past Surgical History:  Procedure Laterality Date  . EYE SURGERY Left 11/12/2019  . LAPAROSCOPIC CHOLECYSTECTOMY  1997    FAMILY HISTORY Family History  Problem Relation Age of Onset  . Diabetes Father   . Hypertension Father   . Heart disease Brother   . Cancer Maternal Grandmother   . Heart disease Brother   . Breast cancer Paternal Aunt   . Cancer Paternal Uncle     SOCIAL HISTORY Social History   Tobacco Use  . Smoking status: Never Smoker  . Smokeless tobacco: Never Used  Vaping Use  . Vaping Use: Never used  Substance Use Topics  . Alcohol use: Not Currently    Comment: socially  . Drug use: Never         OPHTHALMIC EXAM:  Base Eye Exam    Visual Acuity (Snellen - Linear)      Right Left   Dist cc 20/20 - 20/100 -   Dist ph cc  20/50 -2   Correction: Glasses       Tonometry (Tonopen, 8:29 AM)      Right Left   Pressure 14 28       Pupils      Dark Light Shape React APD   Right 3 2 Round Brisk None   Left 3 2 Round Brisk None       Visual Fields (Counting fingers)      Left Right     Full   Restrictions Partial outer superior temporal, inferior temporal deficiencies        Neuro/Psych    Oriented x3: Yes   Mood/Affect: Normal       Dilation    Both eyes: 1.0% Mydriacyl, 2.5% Phenylephrine @ 8:29 AM        Slit Lamp and Fundus Exam    Slit Lamp Exam      Right Left   Lids/Lashes Normal Normal   Conjunctiva/Sclera Mild Melanosis Mild Melanosis   Cornea Clear, mild tear film debris Clear, mild tear film debris   Anterior Chamber Deep and quiet, no cell or flare Deep and quiet, 1+pigment   Iris Round and reactive Round and dilated   Lens Clear Clear   Vitreous Mild Vitreous syneresis mild Vitreous syneresis, vitreous condensations, Ozurdex pellet inferiorly, Posterior vitreous detachment       Fundus Exam      Right Left   Disc Pink and  Sharp, mild PPA, Compact Pink and Sharp, 360 degrees of PPP   C/D Ratio 0.3 0.2   Macula Flat, good foveal reflex, No heme or edema Blunted foveal reflex, +Cystic changes centrally -- persistent,  mild Epiretinal membrane, RPE mottling and clumping   Vessels Vascular attenuation, Tortuous, mild AV crossing changes, [place] Vascular attenuation, Tortuous   Periphery Attached, no heme    Attached, focal CR scar at 0730 equator, good PRP laser changes -- peripheral 360          IMAGING AND PROCEDURES  Imaging and Procedures for @TODAY @  OCT, Retina - OU - Both Eyes       Right Eye Quality was good. Central Foveal Thickness: 212. Progression has been stable. Findings include normal foveal contour, no IRF, no SRF (Broad foveal depression).   Left Eye Quality was good. Central Foveal Thickness: 255. Progression has improved. Findings include intraretinal fluid, epiretinal membrane, outer retinal atrophy, abnormal foveal contour, no SRF (persistent IRF/cystic changes -- slightly increased from prior (May 2021), ?mild interval improvement in central ellipsoid signal).   Notes *Images captured and stored on drive  Diagnosis / Impression: OD: NFP, no IRF/SRF RD:[id number] IRF/cystic changes -- slightly increased from prior (May 2021), ?mild interval improvement in central ellipsoid signal    See below  Abbreviations: NFP - Normal foveal profile. CME - cystoid macular edema. PED - pigment epithelial detachment. IRF - intraretinal fluid. SRF - subretinal fluid. EZ - ellipsoid zone. ERM - epiretinal membrane. ORA - outer retinal atrophy. ORT - outer retinal tubulation. 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 Retinal edema  H35.81 OCT, Retina - OU - Both Eyes  4. 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 Peripheral chorioretinal inflammation and vasculitis OS  - s/p IVO #1 w/ Dr. Manuella Ghazi (03.23.21), s/p IVO #2 (10.05.21)  - had baby in August 2021  - pt reports vague history "vasculitis" OS -- first diagnosed in ?[place] in 2016 -- doesn't think she saw a retina specialist  - states in 2016 had an episode of seeing spots then vision loss OS, told she had "vasculitis"  - +vitreous cell  - exam shows peripapillary atrophy and pigment deposition  - OCT with diffuse peripheral ellipsoid loss with central sparing + central CME slightly increased today  - FA (12.02.20) shows mild, late perivascular leakage, peripheral midzone OS (very late appearing--likely increased signal if study were allowed to continue beyond 10 min); OD normal study  - BCVA slightly decreased to 20/50 from 20/40 OS, but 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 Matilde Bash, Uveitis/Retina expert, who initiated extensive work up and advised laser PRP             - s/p PRP OS (01.25.21) -- good laser changes 360  - s/p IVO #1 (Dr. Manuella Ghazi, 03.23.21), s/p IVO #2 (10.05.21)  - cont PF and Prolensa qid OS  - IOP elevated to 28 OS -- ?steroid response  - start Cosopt BID OS  - f/u 1-2 weeks, IOP check  3. Retinal edema  - OCT shows interval improvement in central CME/IRF, but FA has no corresponding central hyperfluorescent leakage/staining--petaloid or otherwise  4. History of Sickle Cell SS disease  - no SS retinopathy  5. Ocular Hypertension OS  - possible steroid  response  - IOP 28 today  - will start Cosopt BID OS  Ophthalmic Meds Ordered this visit:  Meds ordered this encounter  Medications  . prednisoLONE acetate (PRED FORTE) 1 % ophthalmic suspension    Sig: Place 1 drop into the left eye 4 (four) times daily.    Dispense:  15 mL    Refill:  5  . dorzolamide-timolol (COSOPT) 22.3-6.8  MG/ML ophthalmic solution    Sig: Place 1 drop into the left eye 2 (two) times daily.    Dispense:  10 mL    Refill:  5       Return for f/u 1-2 weeks, IOP check.  There are no Patient Instructions on file for this visit.   Explained the diagnoses, plan, and follow up with the patient and they expressed understanding.  Patient expressed understanding of the importance of proper follow up care.   This document serves as a record of services personally performed by Gardiner Sleeper, MD, PhD. It was created on their behalf by Roselee Nova, COMT. The creation of this record is the provider's dictation and/or activities during the visit.  Electronically signed by: Roselee Nova, COMT 08/31/20 2:18 AM   This document serves as a record of services personally performed by Gardiner Sleeper, MD, PhD. It was created on their behalf by San Jetty. Owens Shark, OA an ophthalmic technician. The creation of this record is the provider's dictation and/or activities during the visit.    Electronically signed by: San Jetty. Owens Shark, [place] 11.10.2021 2:18 AM  Gardiner Sleeper, M.D., Ph.D. Diseases & Surgery of the Retina and [place] 08/27/2020    I have reviewed the above documentation for accuracy and completeness, and I agree with the above. Gardiner Sleeper, M.D., Ph.D. 08/31/20 2:18 AM  Abbreviations: M myopia (nearsighted); A astigmatism; H hyperopia (farsighted); P presbyopia; Mrx spectacle prescription;  CTL contact lenses; OD right eye; OS left eye; OU both eyes  XT exotropia; ET esotropia; PEK punctate epithelial keratitis; PEE punctate epithelial erosions; DES dry eye syndrome; MGD meibomian gland dysfunction; ATs artificial tears; PFAT's preservative free artificial tears; [place] nuclear sclerotic cataract; PSC posterior subcapsular cataract; ERM epi-retinal membrane; PVD posterior vitreous detachment; RD retinal detachment; DM diabetes mellitus; DR diabetic retinopathy; NPDR  non-proliferative diabetic retinopathy; PDR proliferative diabetic retinopathy; CSME clinically significant macular edema; DME diabetic macular edema; dbh dot blot hemorrhages; CWS cotton wool spot; POAG primary open angle glaucoma; C/D cup-to-disc ratio; HVF humphrey visual field; GVF goldmann visual field; OCT optical coherence tomography; IOP intraocular pressure; BRVO Branch retinal vein occlusion; CRVO central retinal vein occlusion; CRAO central retinal artery occlusion; BRAO branch retinal artery occlusion; RT retinal tear; SB scleral buckle; PPV pars plana vitrectomy; VH Vitreous hemorrhage; PRP panretinal laser photocoagulation; IVK intravitreal kenalog; VMT vitreomacular traction; MH Macular hole;  NVD neovascularization of the disc; NVE neovascularization elsewhere; AREDS age related eye disease study; ARMD age related macular degeneration; POAG primary open angle glaucoma; EBMD epithelial/anterior basement membrane dystrophy; ACIOL anterior chamber intraocular lens; IOL intraocular lens; PCIOL posterior chamber intraocular lens; Phaco/IOL phacoemulsification with intraocular lens placement; [place] photorefractive keratectomy; LASIK laser assisted in situ keratomileusis; HTN hypertension; DM diabetes mellitus; COPD chronic obstructive pulmonary disease

## 2020-08-26 ENCOUNTER — Other Ambulatory Visit: Payer: Self-pay

## 2020-08-26 ENCOUNTER — Ambulatory Visit (HOSPITAL_COMMUNITY)
Admission: RE | Admit: 2020-08-26 | Discharge: 2020-08-26 | Disposition: A | Discharge status: Home / Self Care | Payer: BC Managed Care – PPO | Source: Ambulatory Visit | Attending: Family Medicine | Admitting: Family Medicine

## 2020-08-26 ENCOUNTER — Encounter: Payer: Self-pay | Admitting: Family Medicine

## 2020-08-26 ENCOUNTER — Ambulatory Visit (INDEPENDENT_AMBULATORY_CARE_PROVIDER_SITE_OTHER): Payer: BC Managed Care – PPO | Admitting: Family Medicine

## 2020-08-26 VITALS — BP 138/72 | HR 77 | Temp 98.2°F | Resp 16 | Ht 67.0 in | Wt 157.2 lb

## 2020-08-26 DIAGNOSIS — D571 Sickle-cell disease without crisis: Secondary | ICD-10-CM | POA: Diagnosis present

## 2020-08-26 DIAGNOSIS — Z8701 Personal history of pneumonia (recurrent): Secondary | ICD-10-CM | POA: Insufficient documentation

## 2020-08-26 DIAGNOSIS — Z23 Encounter for immunization: Secondary | ICD-10-CM | POA: Diagnosis not present

## 2020-08-26 NOTE — Progress Notes (Addendum)
[facility]   Established Patient Office Visit  Subjective:  Patient ID: Krista [name], female    DOB: 09/06/1986  Age: 34 y.o. MRN: [id number]  CC: Sickle cell follow-up  HPI Krista [name] is a very pleasant 34 year old female with a medical history of sickle cell disease, vitamin D deficiency, and thrombocytopenia presents for post hospital follow-up.  Krista [name] was admitted to inpatient services on 07/29/2020 for community-acquired pneumonia.  Patient was initially evaluated at urgent care and was found to have pneumonia on chest x-ray.  Being that patient was status post delivery 8 weeks prior, patient was evaluated via chest CT to rule out pulmonary embolism.  Patient was treated with IV antibiotics and pain medications.  She completed treatment with levofloxacin without complication. Patient states that she feels well.  She does not have any pain on today.  She denies any headache, chest pain, urinary symptoms, nausea, vomiting, or diarrhea.  Patient fully vaccinated against COVID-19.  Past Medical History:  Diagnosis Date  . Chronic pain    with sickle cell   . Eczema   . Heart murmur    07-13-2019  per pt since childhood,  has not ever had a echo done ,  asymptomatic  . History of blood transfusion    x3   . Missed ab   . Sickle cell anemia ([place])    followed by pcp---  last crisis 12-14-2018  . Vitamin D deficiency   . Wears glasses     Past Surgical History:  Procedure Laterality Date  . EYE SURGERY Left 11/12/2019  . LAPAROSCOPIC CHOLECYSTECTOMY  1997    Family History  Problem Relation Age of Onset  . Diabetes Father   . Hypertension Father   . Heart disease Brother   . Cancer Maternal Grandmother   . Heart disease Brother   . Breast cancer Paternal Aunt   . Cancer Paternal Uncle     Social History   Socioeconomic History  . Marital status: Married    Spouse name: n/a  . Number of children: 0  . Years  of education: 16+  . Highest education level: Not on file  Occupational History  . Occupation: Product manager: [employer]  Tobacco Use  . Smoking status: Never Smoker  . Smokeless tobacco: Never Used  Vaping Use  . Vaping Use: Never used  Substance and Sexual Activity  . Alcohol use: Not Currently    Comment: socially  . Drug use: Never  . Sexual activity: Yes    Birth control/protection: None  Other Topics Concern  . Not on file  Social History Narrative   ** Merged History Encounter **       Lives alone.  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   Social Determinants of Health   Financial Resource Strain:   . Difficulty of Paying Living Expenses: Not on file  Food Insecurity:   . Worried About Charity fundraiser in the Last Year: Not on file  . Ran Out of Food in the Last Year: Not on file  Transportation Needs:   . Lack of Transportation (Medical): Not on file  . Lack of Transportation (Non-Medical): Not on file  Physical Activity:   . Days of Exercise per Week: Not on file  . Minutes of Exercise per Session: Not on file  Stress:   . Feeling of Stress :  Not on file  Social Connections:   . Frequency of Communication with Friends and Family: Not on file  . Frequency of Social Gatherings with Friends and Family: Not on file  . Attends Religious Services: Not on file  . Active Member of Clubs or Organizations: Not on file  . Attends Archivist Meetings: Not on file  . Marital Status: Not on file  Intimate Partner Violence:   . Fear of Current or Ex-Partner: Not on file  . Emotionally Abused: Not on file  . Physically Abused: Not on file  . Sexually Abused: Not on file    Outpatient Medications Prior to Visit  Medication Sig Dispense Refill  . Bromfenac Sodium (PROLENSA) 0.07 % SOLN Place 1 drop into the left eye 4 (four) times daily. 6 mL 3  . Cholecalciferol  (VITAMIN D3) 25 MCG (1000 UT) CAPS TAKE 1 CAPSULE BY MOUTH ONCE DAILY (Patient not taking: Reported on 12/25/2019) 100 capsule 11  . folic acid (FOLVITE) 1 MG tablet Take 1 tablet (1 mg total) by mouth daily. 90 tablet 3  . ibuprofen (ADVIL) 800 MG tablet Take 1 tablet (800 mg total) by mouth every 8 (eight) hours as needed. (Patient taking differently: Take 800 mg by mouth every 8 (eight) hours as needed for moderate pain. ) 60 tablet 1  . ketorolac (ACULAR) 0.5 % ophthalmic solution Place 1 drop into the left eye 4 (four) times daily. 5 mL 0  . mometasone (ELOCON) 0.1 % cream Apply 1 application topically as needed. (Patient not taking: Reported on 02/19/2020) 45 g 2  . oxycodone (OXY-IR) 5 MG capsule Take 1 capsule (5 mg total) by mouth every 4 (four) hours as needed. 30 capsule 0  . prednisoLONE acetate (PRED FORTE) 1 % ophthalmic suspension Place 1 drop into the left eye 4 (four) times daily. 15 mL 0  . Prenatal Vit-Fe Fumarate-FA (PRENATAL MULTIVITAMIN) TABS tablet Take 1 tablet by mouth daily at 12 noon.    . benzonatate (TESSALON PERLES) 100 MG capsule Take 1 capsule (100 mg total) by mouth 3 (three) times daily as needed for cough. 30 capsule 0  . cetirizine (ZYRTEC) 10 MG tablet Take 1 tablet by mouth once daily (Patient not taking: Reported on 06/03/2020) 30 tablet 0  . enoxaparin (LOVENOX) 40 MG/0.4ML injection Inject 0.4 mLs (40 mg total) into the skin daily. (Patient not taking: Reported on 07/29/2020) 30 mL 1  . metoCLOPramide (REGLAN) 10 MG tablet Take 1 tablet (10 mg total) by mouth every 6 (six) hours. (Patient not taking: Reported on 02/19/2020) 30 tablet 1   No facility-administered medications prior to visit.    Allergies  Allergen Reactions  . Keflex [Cephalexin] Swelling    Face swelling    ROS Review of Systems  Constitutional: Negative.   HENT: Negative.   Eyes: Negative.   Respiratory: Negative.   Cardiovascular: Negative.   Gastrointestinal: Negative.   Endocrine:  Negative.   Genitourinary: Negative.   Musculoskeletal: Negative.  Negative for arthralgias and back pain.  Skin: Negative.   Neurological: Negative.   Psychiatric/Behavioral: Negative.       Objective:    Physical Exam Constitutional:      Appearance: Normal appearance.  HENT:     Head: Normocephalic.     Right Ear: Tympanic membrane normal.     Left Ear: Tympanic membrane normal.  Eyes:     Pupils: Pupils are equal, round, and reactive to light.  Cardiovascular:     Rate and  Rhythm: Normal rate and regular rhythm.  Pulmonary:     Effort: Pulmonary effort is normal.  Abdominal:     General: Bowel sounds are normal.  Skin:    General: Skin is warm.  Neurological:     General: No focal deficit present.     Mental Status: She is alert. Mental status is at baseline.  Psychiatric:        Mood and Affect: Mood normal.        Behavior: Behavior normal.        Thought Content: Thought content normal.        Judgment: Judgment normal.     Resp 16   Ht 5\' 7"  (1.702 m)   Wt 157 lb 3.2 oz (71.3 kg)   LMP 08/09/2019   BMI 24.62 kg/m  Wt Readings from Last 3 Encounters:  08/26/20 157 lb 3.2 oz (71.3 kg)  07/29/20 160 lb (72.6 kg)  06/03/20 161 lb (73 kg)     Health Maintenance Due  Topic Date Due  . COVID-19 Vaccine (1) Never done    There are no preventive care reminders to display for this patient.  Lab Results  Component Value Date   TSH 2.21 03/24/2017   Lab Results  Component Value Date   WBC 8.1 08/05/2020   HGB 8.8 (L) 08/05/2020   HCT 26.4 (L) 08/05/2020   MCV 90 08/05/2020   PLT 886 (HH) 08/05/2020   Lab Results  Component Value Date   NA 145 07/31/2020   K 3.8 07/31/2020   CO2 24 07/31/2020   GLUCOSE 84 07/31/2020   BUN 7 07/31/2020   CREATININE 0.70 07/31/2020   BILITOT 4.2 (H) 07/30/2020   ALKPHOS 77 07/30/2020   AST 19 07/30/2020   ALT 24 07/30/2020   PROT 6.7 07/30/2020   ALBUMIN 3.7 07/30/2020   CALCIUM 9.0 07/31/2020   ANIONGAP  10 07/31/2020   Lab Results  Component Value Date   CHOL 112 (L) 08/12/2015   Lab Results  Component Value Date   HDL 26 (L) 08/12/2015   Lab Results  Component Value Date   LDLCALC 67 08/12/2015   Lab Results  Component Value Date   TRIG 95 08/12/2015   Lab Results  Component Value Date   CHOLHDL 4.3 08/12/2015   Lab Results  Component Value Date   HGBA1C <4.0 03/24/2017      Assessment & Plan:   Problem List Items Addressed This Visit    None    Visit Diagnoses    History of pneumonia    -  Primary   Relevant Orders   DG Chest 2 View   Sickle-cell disease without crisis ([place])       Relevant Orders   DG Chest 2 View   Sickle Cell Panel   Urinalysis, Routine w reflex microscopic     History of pneumonia  - DG Chest 2 View; Future  Sickle-cell disease without crisis ([place]) Continue folic acid 1 mg daily to prevent aplastic bone marrow crises.  Patient does not require any further disease modifying agents for sickle cell at this time.  Sickle cell is well controlled.  Pulmonary evaluation - Patient denies severe recurrent wheezes, shortness of breath with exercise, or persistent cough. If these symptoms develop, pulmonary function tests with spirometry will be ordered, and if abnormal, plan on referral to Pulmonology for further evaluation.  Cardiac - Routine screening for pulmonary hypertension is not recommended.   Eye -patient has a history of retinopathy  and is followed closely by ophthalmology.  Immunization status -patient will receive flu vaccination on today, she is up-to-date with all other vaccinations.  Acute and chronic painful episodes -patient has well-controlled sickle cell disease with few pain crisis.  No chronic medications at this time.   - DG Chest 2 View; Future - Sickle Cell Panel - Urinalysis, Routine w reflex microscopic  Need for prophylactic vaccination and inoculation against influenza - Flu Vaccine QUAD 36+ mos IM (Fluarix,  Fluzone & Afluria Quad PF    Follow-up: Return in about 6 months (around 02/23/2021) for sickle cell anemia.    [name]  APRN, MSN, FNP-C [facility] [address] [place], [place] [address] [phone]

## 2020-08-27 ENCOUNTER — Encounter (INDEPENDENT_AMBULATORY_CARE_PROVIDER_SITE_OTHER): Payer: Self-pay | Admitting: Ophthalmology

## 2020-08-27 ENCOUNTER — Ambulatory Visit (INDEPENDENT_AMBULATORY_CARE_PROVIDER_SITE_OTHER): Payer: BC Managed Care – PPO | Admitting: Ophthalmology

## 2020-08-27 DIAGNOSIS — H35062 Retinal vasculitis, left eye: Secondary | ICD-10-CM | POA: Diagnosis not present

## 2020-08-27 DIAGNOSIS — H36 Retinal disorders in diseases classified elsewhere: Secondary | ICD-10-CM

## 2020-08-27 DIAGNOSIS — D571 Sickle-cell disease without crisis: Secondary | ICD-10-CM

## 2020-08-27 DIAGNOSIS — H3581 Retinal edema: Secondary | ICD-10-CM | POA: Diagnosis not present

## 2020-08-27 DIAGNOSIS — H30032 Focal chorioretinal inflammation, peripheral, left eye: Secondary | ICD-10-CM

## 2020-08-27 DIAGNOSIS — H40052 Ocular hypertension, left eye: Secondary | ICD-10-CM

## 2020-08-27 LAB — CMP14+CBC/D/PLT+FER+RETIC+V...
ALT: 14 IU/L (ref 0–32)
AST: 18 IU/L (ref 0–40)
Albumin/Globulin Ratio: 2.1 (ref 1.2–2.2)
Albumin: 4.8 g/dL (ref 3.8–4.8)
Alkaline Phosphatase: 91 IU/L (ref 44–121)
BUN/Creatinine Ratio: 7 — ABNORMAL LOW (ref 9–23)
BUN: 4 mg/dL — ABNORMAL LOW (ref 6–20)
Basophils Absolute: 0.2 10*3/uL (ref 0.0–0.2)
Basos: 1 %
Bilirubin Total: 2.5 mg/dL — ABNORMAL HIGH (ref 0.0–1.2)
CO2: 23 mmol/L (ref 20–29)
Calcium: 9.3 mg/dL (ref 8.7–10.2)
Chloride: 105 mmol/L (ref 96–106)
Creatinine, Ser: 0.54 mg/dL — ABNORMAL LOW (ref 0.57–1.00)
EOS (ABSOLUTE): 0.2 10*3/uL (ref 0.0–0.4)
Eos: 1 %
Ferritin: 369 ng/mL — ABNORMAL HIGH (ref 15–150)
GFR calc Af Amer: 143 mL/min/{1.73_m2} (ref >59)
GFR calc non Af Amer: 124 mL/min/{1.73_m2} (ref >59)
Globulin, Total: 2.3 g/dL (ref 1.5–4.5)
Glucose: 79 mg/dL (ref 65–99)
Hematocrit: 25.4 % — ABNORMAL LOW (ref 34.0–46.6)
Hemoglobin: 8.4 g/dL — ABNORMAL LOW (ref 11.1–15.9)
Immature Grans (Abs): 0.1 10*3/uL (ref 0.0–0.1)
Immature Granulocytes: 1 %
Lymphocytes Absolute: 4.5 10*3/uL — ABNORMAL HIGH (ref 0.7–3.1)
Lymphs: 36 %
MCH: 30.4 pg (ref 26.6–33.0)
MCHC: 33.1 g/dL (ref 31.5–35.7)
MCV: 92 fL (ref 79–97)
Monocytes Absolute: 1 10*3/uL — ABNORMAL HIGH (ref 0.1–0.9)
Monocytes: 9 %
NRBC: 7 % — ABNORMAL HIGH (ref 0–0)
Neutrophils Absolute: 6.3 10*3/uL (ref 1.4–7.0)
Neutrophils: 52 %
Platelets: 634 10*3/uL — ABNORMAL HIGH (ref 150–450)
Potassium: 3.8 mmol/L (ref 3.5–5.2)
RBC: 2.76 x10E6/uL — ABNORMAL LOW (ref 3.77–5.28)
RDW: 15.3 % (ref 11.7–15.4)
Retic Ct Pct: 14 % — ABNORMAL HIGH (ref 0.6–2.6)
Sodium: 141 mmol/L (ref 134–144)
Total Protein: 7.1 g/dL (ref 6.0–8.5)
Vit D, 25-Hydroxy: 17.9 ng/mL — ABNORMAL LOW (ref 30.0–100.0)
WBC: 12.3 10*3/uL — ABNORMAL HIGH (ref 3.4–10.8)

## 2020-08-27 LAB — URINALYSIS, ROUTINE W REFLEX MICROSCOPIC
Bilirubin, UA: NEGATIVE
Glucose, UA: NEGATIVE
Ketones, UA: NEGATIVE
Leukocytes,UA: NEGATIVE
Nitrite, UA: NEGATIVE
Protein,UA: NEGATIVE
RBC, UA: NEGATIVE
Specific Gravity, UA: 1.011 (ref 1.005–1.030)
Urobilinogen, Ur: 1 mg/dL (ref 0.2–1.0)
pH, UA: 7 (ref 5.0–7.5)

## 2020-08-27 MED ORDER — DORZOLAMIDE HCL-TIMOLOL MAL 2-0.5 % OP SOLN: 1.0000 [drp] | Freq: Two times a day (BID) | OPHTHALMIC | 5 refills | Status: DC

## 2020-08-27 MED ORDER — PREDNISOLONE ACETATE 1 % OP SUSP: 1.0000 [drp] | Freq: Four times a day (QID) | OPHTHALMIC | 5 refills | Status: DC

## 2020-08-29 ENCOUNTER — Telehealth (HOSPITAL_COMMUNITY): Payer: Self-pay | Admitting: Family Medicine

## 2020-08-29 NOTE — Telephone Encounter (Signed)
Krista [name] is a 34 year old female with a medical history significant for sickle cell disease that presented on 08/27/2020 for follow-up after having pneumonia.  Chest x-ray shows no acute cardiopulmonary process and interval resolution of pneumonia.  Vitamin D was decreased to 17.9, will start weekly Drisdol 50,000 IU   This information was discussed with patient.  Patient expressed understanding.  We will follow-up as scheduled.  [name]  APRN, MSN, FNP-C [facility] [address] [place], [place] [address] [phone]

## 2020-09-03 ENCOUNTER — Other Ambulatory Visit (INDEPENDENT_AMBULATORY_CARE_PROVIDER_SITE_OTHER): Payer: Self-pay

## 2020-09-03 ENCOUNTER — Telehealth: Payer: Self-pay | Admitting: Family Medicine

## 2020-09-03 MED ORDER — PROLENSA 0.07 % OP SOLN
1.0000 [drp] | Freq: Four times a day (QID) | OPHTHALMIC | 3 refills | Status: DC
Start: 2020-09-03 — End: 2022-11-11

## 2020-09-04 NOTE — Telephone Encounter (Signed)
Sent to provider 

## 2020-09-05 ENCOUNTER — Other Ambulatory Visit: Payer: Self-pay | Admitting: Family Medicine

## 2020-09-05 ENCOUNTER — Encounter (INDEPENDENT_AMBULATORY_CARE_PROVIDER_SITE_OTHER): Payer: BC Managed Care – PPO | Admitting: Ophthalmology

## 2020-09-05 MED ORDER — ASPIRIN EC 81 MG PO TBEC: 81.0000 mg | DELAYED_RELEASE_TABLET | Freq: Every day | ORAL | 5 refills | Status: DC

## 2020-09-08 NOTE — Progress Notes (Signed)
[facility] Note  09/09/2020     CHIEF COMPLAINT Patient presents for Retina Follow Up   HISTORY OF PRESENT ILLNESS: Krista [name] is a 34 y.o. female who presents to the clinic today for:  HPI    Retina Follow Up    Patient presents with  Other.  In left eye.  This started 2 weeks ago.  I, the attending physician,  performed the HPI with the patient and updated documentation appropriately.          Comments    Patient here for 2 weeks retina follow up for IOP Check. Patient states vision is ok. No eye pain. Using gtts.       Last edited by Bernarda Caffey, MD on 09/09/2020 10:16 AM. (History)    Pt feels like vision has improved, her next appt with Dr. Manuella Ghazi is 12.17.21  Referring physician: DeMarco, [place] OD [place], [place] [id number]   HISTORICAL INFORMATION:   Selected notes from the MEDICAL RECORD NUMBER Referral from Dr. [place] DeMarco for macular edema OS LEE: 11.18.20, BCVA OD: 20/20 OS: 20/30+2 PMH: sickle cell disease, chronic pain, heart murmer, hx of retinal vasculitits   CURRENT MEDICATIONS: Current Outpatient Medications (Ophthalmic Drugs)  Medication Sig  . Bromfenac Sodium (PROLENSA) 0.07 % SOLN Place 1 drop into the left eye 4 (four) times daily.  . dorzolamide-timolol (COSOPT) 22.3-6.8 MG/ML ophthalmic solution Place 1 drop into the left eye 2 (two) times daily.  Marland Kitchen ketorolac (ACULAR) 0.5 % ophthalmic solution Place 1 drop into the left eye 4 (four) times daily. (Patient not taking: Reported on 08/27/2020)  . prednisoLONE acetate (PRED FORTE) 1 % ophthalmic suspension Place 1 drop into the left eye 4 (four) times daily.   No current facility-administered medications for this visit. (Ophthalmic Drugs)   Current Outpatient Medications (Other)  Medication Sig  . aspirin EC 81 MG tablet Take 1 tablet (81 mg total) by mouth daily.  . Cholecalciferol (VITAMIN D3) 25 MCG (1000 UT) CAPS TAKE 1 CAPSULE BY  MOUTH ONCE DAILY  . folic acid (FOLVITE) 1 MG tablet Take 1 tablet (1 mg total) by mouth daily.  Marland Kitchen ibuprofen (ADVIL) 800 MG tablet Take 1 tablet (800 mg total) by mouth every 8 (eight) hours as needed. (Patient taking differently: Take 800 mg by mouth every 8 (eight) hours as needed for moderate pain. )  . mometasone (ELOCON) 0.1 % cream Apply 1 application topically as needed.  Marland Kitchen oxycodone (OXY-IR) 5 MG capsule Take 1 capsule (5 mg total) by mouth every 4 (four) hours as needed.  . Prenatal Vit-Fe Fumarate-FA (PRENATAL MULTIVITAMIN) TABS tablet Take 1 tablet by mouth daily at 12 noon. (Patient not taking: Reported on 08/27/2020)   No current facility-administered medications for this visit. (Other)      REVIEW OF SYSTEMS: ROS    Positive for: Gastrointestinal, Cardiovascular, Eyes   Negative for: Constitutional, Neurological, Skin, Genitourinary, Musculoskeletal, HENT, Endocrine, Respiratory, Psychiatric, Allergic/Imm, Heme/Lymph   Last edited by Theodore Demark, COA on 09/09/2020  9:42 AM. (History)       ALLERGIES Allergies  Allergen Reactions  . Keflex [Cephalexin] Swelling    Face swelling    PAST MEDICAL HISTORY Past Medical History:  Diagnosis Date  . Chronic pain    with sickle cell   . Eczema   . Heart murmur    07-13-2019  per pt since childhood,  has not ever had a echo done ,  asymptomatic  . History of blood transfusion    x3   . Missed ab   . Sickle cell anemia ([place])    followed by pcp---  last crisis 12-14-2018  . Vitamin D deficiency   . Wears glasses    Past Surgical History:  Procedure Laterality Date  . EYE SURGERY Left 11/12/2019  . LAPAROSCOPIC CHOLECYSTECTOMY  1997    FAMILY HISTORY Family History  Problem Relation Age of Onset  . Diabetes Father   . Hypertension Father   . Heart disease Brother   . Cancer Maternal Grandmother   . Heart disease Brother   . Breast cancer Paternal Aunt   . Cancer Paternal Uncle     SOCIAL  HISTORY Social History   Tobacco Use  . Smoking status: Never Smoker  . Smokeless tobacco: Never Used  Vaping Use  . Vaping Use: Never used  Substance Use Topics  . Alcohol use: Not Currently    Comment: socially  . Drug use: Never         OPHTHALMIC EXAM:  Base Eye Exam    Visual Acuity (Snellen - Linear)      Right Left   Dist cc 20/20 20/70 -2   Dist ph cc  20/40 -2   Correction: Glasses       Tonometry (Tonopen, 9:39 AM)      Right Left   Pressure 10 15       Pupils      Dark Light Shape React APD   Right 3 2 Round Brisk None   Left 3 2 Round Brisk None       Visual Fields (Counting fingers)      Left Right     Full   Restrictions Partial outer superior temporal, inferior temporal deficiencies        Extraocular Movement      Right Left    Full Full       Neuro/Psych    Oriented x3: Yes   Mood/Affect: Normal       Dilation    Left eye: 1.0% Mydriacyl, 2.5% Phenylephrine @ 9:39 AM        Slit Lamp and Fundus Exam    Slit Lamp Exam      Right Left   Lids/Lashes Normal Normal   Conjunctiva/Sclera Mild Melanosis Mild Melanosis   Cornea Clear, mild tear film debris Clear, mild tear film debris   Anterior Chamber Deep and quiet, no cell or flare Deep and quiet, 1+pigment   Iris Round and reactive Round and dilated   Lens Clear Clear   Vitreous Mild Vitreous syneresis mild Vitreous syneresis, vitreous condensations, Ozurdex pellet inferiorly, Posterior vitreous detachment       Fundus Exam      Right Left   Disc Pink and Sharp, mild PPA, Compact Pink and Sharp, 360 degrees of PPP, +fibrosis   C/D Ratio 0.3 0.2   Macula Flat, good foveal reflex, No heme or edema Blunted foveal reflex, +Cystic changes centrally -- slightly improved, mild Epiretinal membrane, RPE mottling and clumping   Vessels Vascular attenuation, Tortuous, mild AV crossing changes, Copper wiring Vascular attenuation, Tortuous   Periphery Attached, no heme    Attached, focal CR  scar at 0730 equator, good PRP laser changes -- peripheral 360        Refraction    Wearing Rx      Sphere Cylinder Axis   Right -1.50 +1.25 005   Left -1.50 +0.75 006   Type:  SVL          IMAGING AND PROCEDURES  Imaging and Procedures for @TODAY @  OCT, Retina - OU - Both Eyes       Right Eye Quality was good. Central Foveal Thickness: 212. Progression has been stable. Findings include normal foveal contour, no IRF, no SRF (Broad foveal depression).   Left Eye Quality was good. Central Foveal Thickness: 210. Progression has improved. Findings include intraretinal fluid, epiretinal membrane, outer retinal atrophy, abnormal foveal contour, no SRF (Interval improvement in IRF/ellipsoid signal).   Notes *Images captured and stored on drive  Diagnosis / Impression: OD: NFP, no IRF/SRF OS: +CME -- Interval improvement in IRF/ellipsoid signal    See below  Abbreviations: NFP - Normal foveal profile. CME - cystoid macular edema. PED - pigment epithelial detachment. IRF - intraretinal fluid. SRF - subretinal fluid. EZ - ellipsoid zone. ERM - epiretinal membrane. ORA - outer retinal atrophy. ORT - outer retinal tubulation. 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 Retinal edema  H35.81 OCT, Retina - OU - Both Eyes  4. 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 Peripheral chorioretinal inflammation and vasculitis OS  - s/p IVO #1 w/ Dr. Manuella Ghazi (03.23.21), s/p IVO #2 (10.05.21)  - had baby in August 2021  - pt reports vague history "vasculitis" OS -- first diagnosed in ?[place] in 2016 -- doesn't think she saw a retina specialist  - states in 2016 had an episode of seeing spots then vision loss OS, told she had "vasculitis"  - +vitreous  cell  - exam shows peripapillary atrophy and pigment deposition  - OCT with diffuse peripheral ellipsoid loss with central sparing + central CME -- interval decrease in IRF today  - FA (12.02.20) shows mild, late perivascular leakage, peripheral midzone OS (very late appearing--likely increased signal if study were allowed to continue beyond 10 min); OD normal study  - BCVA slightly improved to 20/40 from 20/50 OS, but 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 Matilde Bash, Uveitis/Retina expert, who initiated extensive work up and advised laser PRP             - s/p PRP OS (01.25.21) -- good laser changes 360  - s/p IVO #1 (Dr. Manuella Ghazi, 03.23.21), s/p IVO #2 (10.05.21)  - cont PF and Prolensa qid OS  - IOP improved to 15 OS from 28 after starting Cosopt BID OS -- likely steroid response  - cont Cosopt BID OS  - f/u here in 6 months, DFE, OCT  - scheduled to see Dr. Manuella Ghazi Oct 03, 2020  3. Retinal edema  - OCT shows interval improvement in central CME/IRF, but FA has no corresponding central hyperfluorescent leakage/staining--petaloid or otherwise  4. History of Sickle Cell SS disease  - no SS retinopathy  5. Ocular Hypertension OS  - possible steroid response  - IOP improved to 15 today from 28 -- started Cosopt BID OS  - continue Cosopt BID OS  Ophthalmic Meds Ordered this visit:  No orders of the defined types were placed in this encounter.  Return in about 6 months (around 03/09/2021) for f/u vasculitis OS, DFE, OCT.  There are no Patient Instructions on file for this visit.   Explained the diagnoses, plan, and follow up with the patient and they expressed understanding.  Patient expressed understanding of the importance of proper follow up care.   This document serves as a record of services personally performed by Gardiner Sleeper, MD, PhD. It was created on their behalf by Estill Bakes, COT an ophthalmic  technician. The creation of this record is the provider's dictation and/or activities during the visit.    Electronically signed by: Estill Bakes, COT 11.22.21 @ 9:29 PM   This document serves as a record of services personally performed by Gardiner Sleeper, MD, PhD. It was created on their behalf by San Jetty. Owens Shark, OA an ophthalmic technician. The creation of this record is the provider's dictation and/or activities during the visit.    Electronically signed by: San Jetty. Marguerita Merles 11.23.2021 9:29 PM  Gardiner Sleeper, M.D., Ph.D. Diseases & Surgery of the Retina and [place] 09/09/2020   I have reviewed the above documentation for accuracy and completeness, and I agree with the above. Gardiner Sleeper, M.D., Ph.D. 09/09/20 9:29 PM   Abbreviations: M myopia (nearsighted); A astigmatism; H hyperopia (farsighted); P presbyopia; Mrx spectacle prescription;  CTL contact lenses; OD right eye; OS left eye; OU both eyes  XT exotropia; ET esotropia; PEK punctate epithelial keratitis; PEE punctate epithelial erosions; DES dry eye syndrome; MGD meibomian gland dysfunction; ATs artificial tears; PFAT's preservative free artificial tears; [place] nuclear sclerotic cataract; PSC posterior subcapsular cataract; ERM epi-retinal membrane; PVD posterior vitreous detachment; RD retinal detachment; DM diabetes mellitus; DR diabetic retinopathy; NPDR non-proliferative diabetic retinopathy; PDR proliferative diabetic retinopathy; CSME clinically significant macular edema; DME diabetic macular edema; dbh dot blot hemorrhages; CWS cotton wool spot; POAG primary open angle glaucoma; C/D cup-to-disc ratio; HVF humphrey visual field; GVF goldmann visual field; OCT optical coherence tomography; IOP intraocular pressure; BRVO Branch retinal vein occlusion; CRVO central retinal vein occlusion; CRAO central retinal artery occlusion; BRAO branch retinal artery occlusion; RT retinal tear; SB scleral  buckle; PPV pars plana vitrectomy; VH Vitreous hemorrhage; PRP panretinal laser photocoagulation; IVK intravitreal kenalog; VMT vitreomacular traction; MH Macular hole;  NVD neovascularization of the disc; NVE neovascularization elsewhere; AREDS age related eye disease study; ARMD age related macular degeneration; POAG primary open angle glaucoma; EBMD epithelial/anterior basement membrane dystrophy; ACIOL anterior chamber intraocular lens; IOL intraocular lens; PCIOL posterior chamber intraocular lens; Phaco/IOL phacoemulsification with intraocular lens placement; [place] photorefractive keratectomy; LASIK laser assisted in situ keratomileusis; HTN hypertension; DM diabetes mellitus; COPD chronic obstructive pulmonary disease

## 2020-09-09 ENCOUNTER — Other Ambulatory Visit: Payer: Self-pay

## 2020-09-09 ENCOUNTER — Ambulatory Visit (INDEPENDENT_AMBULATORY_CARE_PROVIDER_SITE_OTHER): Payer: BC Managed Care – PPO | Admitting: Ophthalmology

## 2020-09-09 ENCOUNTER — Encounter (INDEPENDENT_AMBULATORY_CARE_PROVIDER_SITE_OTHER): Payer: Self-pay | Admitting: Ophthalmology

## 2020-09-09 DIAGNOSIS — H3581 Retinal edema: Secondary | ICD-10-CM | POA: Diagnosis not present

## 2020-09-09 DIAGNOSIS — H35062 Retinal vasculitis, left eye: Secondary | ICD-10-CM | POA: Diagnosis not present

## 2020-09-09 DIAGNOSIS — H36 Retinal disorders in diseases classified elsewhere: Secondary | ICD-10-CM

## 2020-09-09 DIAGNOSIS — H40052 Ocular hypertension, left eye: Secondary | ICD-10-CM

## 2020-09-09 DIAGNOSIS — H30032 Focal chorioretinal inflammation, peripheral, left eye: Secondary | ICD-10-CM | POA: Diagnosis not present

## 2020-09-09 DIAGNOSIS — D571 Sickle-cell disease without crisis: Secondary | ICD-10-CM | POA: Diagnosis not present

## 2020-11-11 ENCOUNTER — Other Ambulatory Visit: Payer: Self-pay

## 2020-11-11 ENCOUNTER — Encounter: Payer: Self-pay | Admitting: Family Medicine

## 2020-11-11 ENCOUNTER — Ambulatory Visit (HOSPITAL_COMMUNITY)
Admission: RE | Admit: 2020-11-11 | Discharge: 2020-11-11 | Disposition: A | Discharge status: Home / Self Care | Payer: BC Managed Care – PPO | Source: Ambulatory Visit | Attending: Family Medicine | Admitting: Family Medicine

## 2020-11-11 ENCOUNTER — Ambulatory Visit (INDEPENDENT_AMBULATORY_CARE_PROVIDER_SITE_OTHER): Payer: BC Managed Care – PPO | Admitting: Family Medicine

## 2020-11-11 VITALS — BP 113/74 | HR 59 | Temp 97.9°F | Ht 67.0 in | Wt 157.8 lb

## 2020-11-11 DIAGNOSIS — D571 Sickle-cell disease without crisis: Secondary | ICD-10-CM

## 2020-11-11 DIAGNOSIS — R079 Chest pain, unspecified: Secondary | ICD-10-CM | POA: Diagnosis not present

## 2020-11-11 DIAGNOSIS — E559 Vitamin D deficiency, unspecified: Secondary | ICD-10-CM | POA: Diagnosis not present

## 2020-11-11 LAB — POCT URINALYSIS DIPSTICK
Bilirubin, UA: NEGATIVE
Blood, UA: NEGATIVE
Glucose, UA: NEGATIVE
Ketones, UA: NEGATIVE
Leukocytes, UA: NEGATIVE
Nitrite, UA: NEGATIVE
Protein, UA: NEGATIVE
Spec Grav, UA: 1.025 (ref 1.010–1.025)
Urobilinogen, UA: 0.2 E.U./dL
pH, UA: 5.5 (ref 5.0–8.0)

## 2020-11-11 NOTE — Patient Instructions (Addendum)
We will follow-up by phone after reviewing chest x-ray results.  Also, will discuss labs at that time.  Increase water intake to 64 ounces a day.  Also, recommend that you follow-up with gynecologist for birth control options to suppress monthly cycles.  Recommend suppression with monthly cycles due to the triggering of your sickle cell pain crises.  You may benefit from a progesterone only birth control.  Sickle Cell Anemia, Adult  Sickle cell anemia is a condition where your red blood cells are shaped like sickles. Red blood cells carry oxygen through the body. Sickle-shaped cells do not live as long as normal red blood cells. They also clump together and block blood from flowing through the blood vessels. This prevents the body from getting enough oxygen. Sickle cell anemia causes organ damage and pain. It also increases the risk of infection. Follow these instructions at home: Medicines  Take over-the-counter and prescription medicines only as told by your doctor.  If you were prescribed an antibiotic medicine, take it as told by your doctor. Do not stop taking the antibiotic even if you start to feel better.  If you develop a fever, do not take medicines to lower the fever right away. Tell your doctor about the fever. Managing pain, stiffness, and swelling  Try these methods to help with pain: ? Use a heating pad. ? Take a warm bath. ? Distract yourself, such as by watching TV. Eating and drinking  Drink enough fluid to keep your pee (urine) clear or pale yellow. Drink more in hot weather and during exercise.  Limit or avoid alcohol.  Eat a healthy diet. Eat plenty of fruits, vegetables, whole grains, and lean protein.  Take vitamins and supplements as told by your doctor. Traveling  When traveling, keep these with you: ? Your medical information. ? The names of your doctors. ? Your medicines.  If you need to take an airplane, talk to your doctor first. Activity  Rest  often.  Avoid exercises that make your heart beat much faster, such as jogging. General instructions  Do not use products that have nicotine or tobacco, such as cigarettes and e-cigarettes. If you need help quitting, ask your doctor.  Consider wearing a medical alert bracelet.  Avoid being in high places (high altitudes), such as mountains.  Avoid very hot or cold temperatures.  Avoid places where the temperature changes a lot.  Keep all follow-up visits as told by your doctor. This is important. Contact a doctor if:  A joint hurts.  Your feet or hands hurt or swell.  You feel tired (fatigued). Get help right away if:  You have symptoms of infection. These include: ? Fever. ? Chills. ? Being very tired. ? Irritability. ? Poor eating. ? Throwing up (vomiting).  You feel dizzy or faint.  You have new stomach pain, especially on the left side.  You have a an erection (priapism) that lasts more than 4 hours.  You have numbness in your arms or legs.  You have a hard time moving your arms or legs.  You have trouble talking.  You have pain that does not go away when you take medicine.  You are short of breath.  You are breathing fast.  You have a long-term cough.  You have pain in your chest.  You have a bad headache.  You have a stiff neck.  Your stomach looks bloated even though you did not eat much.  Your skin is pale.  You suddenly cannot see  well. Summary  Sickle cell anemia is a condition where your red blood cells are shaped like sickles.  Follow your doctor's advice on ways to manage pain, food to eat, activities to do, and steps to take for safe travel.  Get medical help right away if you have any signs of infection, such as a fever. This information is not intended to replace advice given to you by your health care provider. Make sure you discuss any questions you have with your health care provider. Document Revised: 02/28/2020 Document  Reviewed: 02/28/2020 Elsevier Patient Education  [place].

## 2020-11-11 NOTE — Progress Notes (Signed)
Patient [facility]   Established Patient Office Visit  Subjective:  Patient ID: Krista [name], female    DOB: 1986-03-22  Age: 35 y.o. MRN: [id number]  CC:  Chief Complaint  Patient presents with  . Back Pain    Started last Wednesday having lower back pain , chest pain tightness off and on     HPI Krista [name] is a very pleasant 35 year old female with a medical history significant for sickle cell disease that presents for follow-up.  Patient states that she has been experiencing some increased sickle cell pain crises that seem to be triggered by her menstrual cycle on a monthly basis.  She states that pain intensity was increased in bilateral lower extremities and mid chest for about 6 days.  She has been taking home medications around-the-clock without sustained relief.  She says the symptoms are controlled at this time.  However she continues to experience some mild central chest pain, which she has not had in the past.  Patient does have a history of pneumonia and was treated several months ago.  Past Medical History:  Diagnosis Date  . Chronic pain    with sickle cell   . Eczema   . Heart murmur    07-13-2019  per pt since childhood,  has not ever had a echo done ,  asymptomatic  . History of blood transfusion    x3   . Missed ab   . Sickle cell anemia ([place])    followed by pcp---  last crisis 12-14-2018  . Vitamin D deficiency   . Wears glasses     Past Surgical History:  Procedure Laterality Date  . EYE SURGERY Left 11/12/2019  . LAPAROSCOPIC CHOLECYSTECTOMY  1997    Family History  Problem Relation Age of Onset  . Diabetes Father   . Hypertension Father   . Heart disease Brother   . Cancer Maternal Grandmother   . Heart disease Brother   . Breast cancer Paternal Aunt   . Cancer Paternal Uncle     Social History   Socioeconomic History  . Marital status: Married    Spouse name: n/a  . Number of children: 0  .  Years of education: 16+  . Highest education level: Not on file  Occupational History  . Occupation: Product manager: [employer]  Tobacco Use  . Smoking status: Never Smoker  . Smokeless tobacco: Never Used  Vaping Use  . Vaping Use: Never used  Substance and Sexual Activity  . Alcohol use: Not Currently    Comment: socially  . Drug use: Never  . Sexual activity: Yes    Birth control/protection: None  Other Topics Concern  . Not on file  Social History Narrative   ** Merged History Encounter **       Lives alone.  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   Social Determinants of Health   Financial Resource Strain: Not on file  Food Insecurity: Not on file  Transportation Needs: Not on file  Physical Activity: Not on file  Stress: Not on file  Social Connections: Not on file  Intimate Partner Violence: Not on file    Outpatient Medications Prior to Visit  Medication Sig Dispense Refill  . aspirin EC 81 MG tablet Take 1 tablet (81 mg total) by mouth daily. 30 tablet 5  . Bromfenac Sodium (PROLENSA) 0.07 % SOLN  Place 1 drop into the left eye 4 (four) times daily. 6 mL 3  . Cholecalciferol (VITAMIN D3) 25 MCG (1000 UT) CAPS TAKE 1 CAPSULE BY MOUTH ONCE DAILY 100 capsule 11  . dorzolamide-timolol (COSOPT) 22.3-6.8 MG/ML ophthalmic solution Place 1 drop into the left eye 2 (two) times daily. 10 mL 5  . folic acid (FOLVITE) 1 MG tablet Take 1 tablet (1 mg total) by mouth daily. 90 tablet 3  . ibuprofen (ADVIL) 800 MG tablet Take 1 tablet (800 mg total) by mouth every 8 (eight) hours as needed. (Patient taking differently: Take 800 mg by mouth every 8 (eight) hours as needed for moderate pain.) 60 tablet 1  . ketorolac (ACULAR) 0.5 % ophthalmic solution Place 1 drop into the left eye 4 (four) times daily. 5 mL 0  . mometasone (ELOCON) 0.1 % cream Apply 1 application topically as needed. 45 g 2   . oxycodone (OXY-IR) 5 MG capsule Take 1 capsule (5 mg total) by mouth every 4 (four) hours as needed. 30 capsule 0  . prednisoLONE acetate (PRED FORTE) 1 % ophthalmic suspension Place 1 drop into the left eye 4 (four) times daily. 15 mL 5  . Prenatal Vit-Fe Fumarate-FA (PRENATAL MULTIVITAMIN) TABS tablet Take 1 tablet by mouth daily at 12 noon.     No facility-administered medications prior to visit.    Allergies  Allergen Reactions  . Keflex [Cephalexin] Swelling    Face swelling    ROS Review of Systems  Constitutional: Negative for activity change and appetite change.  HENT: Negative.   Eyes: Negative.   Respiratory: Negative.   Cardiovascular: Positive for chest pain. Negative for palpitations and leg swelling.  Endocrine: Negative.   Genitourinary: Negative.   Musculoskeletal: Positive for arthralgias.  Skin: Negative.   Allergic/Immunologic: Negative.   Neurological: Negative.   Hematological: Negative.   Psychiatric/Behavioral: Negative.       Objective:    Physical Exam Constitutional:      Appearance: Normal appearance.  Eyes:     Pupils: Pupils are equal, round, and reactive to light.  Cardiovascular:     Rate and Rhythm: Normal rate and regular rhythm.     Pulses: Normal pulses.     Heart sounds: Normal heart sounds.  Pulmonary:     Effort: Pulmonary effort is normal.  Abdominal:     General: Abdomen is flat. Bowel sounds are normal.  Skin:    General: Skin is warm.  Neurological:     General: No focal deficit present.     Mental Status: She is alert. Mental status is at baseline.  Psychiatric:        Mood and Affect: Mood normal.        Thought Content: Thought content normal.        Judgment: Judgment normal.     BP 113/74 (BP Location: Left Arm, Patient Position: Sitting, Cuff Size: Normal)   Pulse (!) 59   Temp 97.9 F (36.6 C) (Temporal)   Ht 5\' 7"  (1.702 m)   Wt 157 lb 12.8 oz (71.6 kg)   LMP 11/05/2020   SpO2 99%   BMI 24.71 kg/m   Wt Readings from Last 3 Encounters:  11/11/20 157 lb 12.8 oz (71.6 kg)  08/26/20 157 lb 3.2 oz (71.3 kg)  07/29/20 160 lb (72.6 kg)     Health Maintenance Due  Topic Date Due  . COVID-19 Vaccine (1) Never done    There are no preventive care reminders to display for  this patient.  Lab Results  Component Value Date   TSH 2.21 03/24/2017   Lab Results  Component Value Date   WBC 12.3 (H) 08/26/2020   HGB 8.4 (L) 08/26/2020   HCT 25.4 (L) 08/26/2020   MCV 92 08/26/2020   PLT 634 (H) 08/26/2020   Lab Results  Component Value Date   NA 141 08/26/2020   K 3.8 08/26/2020   CO2 23 08/26/2020   GLUCOSE 79 08/26/2020   BUN 4 (L) 08/26/2020   CREATININE 0.54 (L) 08/26/2020   BILITOT 2.5 (H) 08/26/2020   ALKPHOS 91 08/26/2020   AST 18 08/26/2020   ALT 14 08/26/2020   PROT 7.1 08/26/2020   ALBUMIN 4.8 08/26/2020   CALCIUM 9.3 08/26/2020   ANIONGAP 10 07/31/2020   Lab Results  Component Value Date   CHOL 112 (L) 08/12/2015   Lab Results  Component Value Date   HDL 26 (L) 08/12/2015   Lab Results  Component Value Date   LDLCALC 67 08/12/2015   Lab Results  Component Value Date   TRIG 95 08/12/2015   Lab Results  Component Value Date   CHOLHDL 4.3 08/12/2015   Lab Results  Component Value Date   HGBA1C <4.0 03/24/2017      Assessment & Plan:   Problem List Items Addressed This Visit      Other   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 Hb-SS disease without crisis ([place]) Continue folic acid 1 mg daily to prevent aplastic bone marrow crises.    Pulmonary evaluation - Patient denies severe recurrent wheezes, shortness of breath with exercise, or persistent cough. If these symptoms develop, pulmonary function tests with spirometry will be ordered, and if abnormal, plan on referral to  Pulmonology for further evaluation.  Cardiac - Routine screening for pulmonary hypertension is not recommended.  Eye - High risk of proliferative retinopathy. Annual eye exam with retinal exam recommended to patient.   - Sickle Cell Panel - Urinalysis Dipstick  2. Vitamin D deficiency  - Sickle Cell Panel  3. Chest pain, unspecified type  - DG Chest 2 View; Future  Follow-up: Return in about 6 months (around 05/11/2021).    [name]  APRN, MSN, FNP-C [facility] [address] [place], [place] [address] [phone]

## 2020-11-12 ENCOUNTER — Telehealth: Payer: Self-pay | Admitting: Family Medicine

## 2020-11-12 ENCOUNTER — Other Ambulatory Visit: Payer: Self-pay | Admitting: Family Medicine

## 2020-11-12 DIAGNOSIS — E559 Vitamin D deficiency, unspecified: Secondary | ICD-10-CM

## 2020-11-12 LAB — CMP14+CBC/D/PLT+FER+RETIC+V...
ALT: 12 IU/L (ref 0–32)
AST: 20 IU/L (ref 0–40)
Albumin/Globulin Ratio: 2 (ref 1.2–2.2)
Albumin: 5.1 g/dL — ABNORMAL HIGH (ref 3.8–4.8)
Alkaline Phosphatase: 88 IU/L (ref 44–121)
BUN/Creatinine Ratio: 10 (ref 9–23)
BUN: 6 mg/dL (ref 6–20)
Basophils Absolute: 0.1 10*3/uL (ref 0.0–0.2)
Basos: 1 %
Bilirubin Total: 2.3 mg/dL — ABNORMAL HIGH (ref 0.0–1.2)
CO2: 19 mmol/L — ABNORMAL LOW (ref 20–29)
Calcium: 9.3 mg/dL (ref 8.7–10.2)
Chloride: 105 mmol/L (ref 96–106)
Creatinine, Ser: 0.58 mg/dL (ref 0.57–1.00)
EOS (ABSOLUTE): 0.1 10*3/uL (ref 0.0–0.4)
Eos: 1 %
Ferritin: 292 ng/mL — ABNORMAL HIGH (ref 15–150)
GFR calc Af Amer: 140 mL/min/{1.73_m2} (ref >59)
GFR calc non Af Amer: 121 mL/min/{1.73_m2} (ref >59)
Globulin, Total: 2.6 g/dL (ref 1.5–4.5)
Glucose: 75 mg/dL (ref 65–99)
Hematocrit: 24.1 % — ABNORMAL LOW (ref 34.0–46.6)
Hemoglobin: 8 g/dL — ABNORMAL LOW (ref 11.1–15.9)
Immature Grans (Abs): 0.1 10*3/uL (ref 0.0–0.1)
Immature Granulocytes: 1 %
Lymphocytes Absolute: 3.4 10*3/uL — ABNORMAL HIGH (ref 0.7–3.1)
Lymphs: 33 %
MCH: 30.9 pg (ref 26.6–33.0)
MCHC: 33.2 g/dL (ref 31.5–35.7)
MCV: 93 fL (ref 79–97)
Monocytes Absolute: 0.7 10*3/uL (ref 0.1–0.9)
Monocytes: 7 %
NRBC: 6 % — ABNORMAL HIGH (ref 0–0)
Neutrophils Absolute: 6.1 10*3/uL (ref 1.4–7.0)
Neutrophils: 57 %
Platelets: 641 10*3/uL — ABNORMAL HIGH (ref 150–450)
Potassium: 4.1 mmol/L (ref 3.5–5.2)
RBC: 2.59 x10E6/uL — CL (ref 3.77–5.28)
RDW: 16.3 % — ABNORMAL HIGH (ref 11.7–15.4)
Retic Ct Pct: 17.1 % — ABNORMAL HIGH (ref 0.6–2.6)
Sodium: 143 mmol/L (ref 134–144)
Total Protein: 7.7 g/dL (ref 6.0–8.5)
Vit D, 25-Hydroxy: 14.7 ng/mL — ABNORMAL LOW (ref 30.0–100.0)
WBC: 10.6 10*3/uL (ref 3.4–10.8)

## 2020-11-12 MED ORDER — ERGOCALCIFEROL 1.25 MG (50000 UT) PO CAPS: 50000.0000 [IU] | ORAL_CAPSULE | ORAL | 1 refills | Status: DC

## 2020-11-12 NOTE — Progress Notes (Signed)
Meds ordered this encounter  Medications   ergocalciferol (VITAMIN D2) 1.25 MG (50000 UT) capsule    Sig: Take 1 capsule (50,000 Units total) by mouth once a week.    Dispense:  12 capsule    Refill:  1    Order Specific Question:   Supervising Provider    Answer:   JEGEDE, OLUGBEMIGA E [[id number]]   Lachina Moore Hollis  APRN, MSN, FNP-C [facility] [address]  [place], [place] [address] [phone]  

## 2020-11-12 NOTE — Telephone Encounter (Signed)
Patient notified of results, verbally understood. No additional questions.

## 2020-11-12 NOTE — Telephone Encounter (Signed)
-----   Message from Dorena Dew, [place] sent at 11/12/2020 12:21 PM EST ----- Regarding: lab results Please inform patient that chest x-ray showed no acute cardiopulmonary process.  If periodic chest pain worsens, please follow-up at the nearest emergency department or in clinic.  For acute sickle cell pain crisis whose sickle cell day Hospital is available Monday through Friday from 8 AM to 4:30 PM.  Patient intake is from 8 until 12.  Your vitamin D level was low, will start weekly vitamin D supplementation.  We will recheck levels in 3 months. All other laboratory values are within baseline. Continue to hydrate consistently and take folic acid daily.  If begin to increase, you may warrant a referral to hematology.  Follow-up as scheduled   [name]  APRN, MSN, FNP-C [facility] [address] [place], [place] [address] [phone]

## 2020-12-07 ENCOUNTER — Emergency Department (HOSPITAL_COMMUNITY)
Admission: EM | Admit: 2020-12-07 | Discharge: 2020-12-07 | Disposition: A | Discharge status: Home / Self Care | Payer: BC Managed Care – PPO | Source: Home / Self Care | Attending: Emergency Medicine | Admitting: Emergency Medicine

## 2020-12-07 ENCOUNTER — Other Ambulatory Visit: Payer: Self-pay

## 2020-12-07 DIAGNOSIS — Z7982 Long term (current) use of aspirin: Secondary | ICD-10-CM | POA: Insufficient documentation

## 2020-12-07 DIAGNOSIS — D57219 Sickle-cell/Hb-C disease with crisis, unspecified: Secondary | ICD-10-CM | POA: Insufficient documentation

## 2020-12-07 DIAGNOSIS — Z79899 Other long term (current) drug therapy: Secondary | ICD-10-CM | POA: Insufficient documentation

## 2020-12-07 DIAGNOSIS — D72829 Elevated white blood cell count, unspecified: Secondary | ICD-10-CM | POA: Insufficient documentation

## 2020-12-07 DIAGNOSIS — D5700 Hb-SS disease with crisis, unspecified: Secondary | ICD-10-CM | POA: Diagnosis not present

## 2020-12-07 LAB — CBC WITH DIFFERENTIAL/PLATELET
Abs Immature Granulocytes: 0.37 10*3/uL — ABNORMAL HIGH (ref 0.00–0.07)
Basophils Absolute: 0.1 10*3/uL (ref 0.0–0.1)
Basophils Relative: 1 %
Eosinophils Absolute: 0.1 10*3/uL (ref 0.0–0.5)
Eosinophils Relative: 1 %
HCT: 21.5 % — ABNORMAL LOW (ref 36.0–46.0)
Hemoglobin: 7.5 g/dL — ABNORMAL LOW (ref 12.0–15.0)
Immature Granulocytes: 2 %
Lymphocytes Relative: 14 %
Lymphs Abs: 3 10*3/uL (ref 0.7–4.0)
MCH: 31.5 pg (ref 26.0–34.0)
MCHC: 34.9 g/dL (ref 30.0–36.0)
MCV: 90.3 fL (ref 80.0–100.0)
Monocytes Absolute: 1.8 10*3/uL — ABNORMAL HIGH (ref 0.1–1.0)
Monocytes Relative: 8 %
Neutro Abs: 15.7 10*3/uL — ABNORMAL HIGH (ref 1.7–7.7)
Neutrophils Relative %: 74 %
Platelets: 810 10*3/uL — ABNORMAL HIGH (ref 150–400)
RBC: 2.38 MIL/uL — ABNORMAL LOW (ref 3.87–5.11)
RDW: 18.3 % — ABNORMAL HIGH (ref 11.5–15.5)
WBC: 21 10*3/uL — ABNORMAL HIGH (ref 4.0–10.5)
nRBC: 1.5 % — ABNORMAL HIGH (ref 0.0–0.2)

## 2020-12-07 LAB — COMPREHENSIVE METABOLIC PANEL
ALT: 13 U/L (ref 0–44)
AST: 20 U/L (ref 15–41)
Albumin: 4.5 g/dL (ref 3.5–5.0)
Alkaline Phosphatase: 66 U/L (ref 38–126)
Anion gap: 9 (ref 5–15)
BUN: 6 mg/dL (ref 6–20)
CO2: 21 mmol/L — ABNORMAL LOW (ref 22–32)
Calcium: 9 mg/dL (ref 8.9–10.3)
Chloride: 113 mmol/L — ABNORMAL HIGH (ref 98–111)
Creatinine, Ser: 0.54 mg/dL (ref 0.44–1.00)
GFR, Estimated: >60 mL/min (ref >60)
Glucose, Bld: 85 mg/dL (ref 70–99)
Potassium: 3.7 mmol/L (ref 3.5–5.1)
Sodium: 143 mmol/L (ref 135–145)
Total Bilirubin: 3.2 mg/dL — ABNORMAL HIGH (ref 0.3–1.2)
Total Protein: 7.5 g/dL (ref 6.5–8.1)

## 2020-12-07 LAB — RETICULOCYTES
Immature Retic Fract: 25.3 % — ABNORMAL HIGH (ref 2.3–15.9)
RBC.: 2.43 MIL/uL — ABNORMAL LOW (ref 3.87–5.11)
Retic Count, Absolute: 99.5 10*3/uL (ref 19.0–186.0)
Retic Ct Pct: 12.3 % — ABNORMAL HIGH (ref 0.4–3.1)

## 2020-12-07 LAB — I-STAT BETA HCG BLOOD, ED (MC, WL, AP ONLY): I-stat hCG, quantitative: <5 m[IU]/mL (ref <5)

## 2020-12-07 MED ORDER — HYDROMORPHONE HCL 2 MG/ML IJ SOLN
2.0000 mg | INTRAMUSCULAR | Status: AC
  Administered 2020-12-07: 2 mg via INTRAVENOUS
  Filled 2020-12-07: qty 1

## 2020-12-07 MED ORDER — ONDANSETRON HCL 4 MG/2ML IJ SOLN: 4.0000 mg | Freq: Once | INTRAMUSCULAR | Status: DC

## 2020-12-07 MED ORDER — SODIUM CHLORIDE 0.45 % IV SOLN: INTRAVENOUS | Status: DC

## 2020-12-07 MED ORDER — ONDANSETRON 4 MG PO TBDP
4.0000 mg | ORAL_TABLET | Freq: Once | ORAL | Status: AC
  Administered 2020-12-07: 4 mg via ORAL
  Filled 2020-12-07: qty 1

## 2020-12-07 MED ORDER — KETOROLAC TROMETHAMINE 15 MG/ML IJ SOLN
15.0000 mg | INTRAMUSCULAR | Status: AC
Start: 2020-12-07 — End: 2020-12-07
  Administered 2020-12-07: 15 mg via INTRAVENOUS
  Filled 2020-12-07: qty 1

## 2020-12-07 NOTE — Discharge Instructions (Addendum)
Continue taking home medications as prescribed. Follow-up with your primary care doctor as needed for recheck of symptoms. Return to the emergency room immediately if you develop fever infectious symptoms.  Return if you have further uncontrollable pain.  Return with any new, worsening, or concerning symptoms

## 2020-12-07 NOTE — ED Triage Notes (Signed)
Pt came in with sickle cell pain crisis that started after working out yesterday. Pt took home dose of Oxycodone. Pt states pain is all over in her joints. Pain started last night

## 2020-12-07 NOTE — ED Notes (Signed)
Pt in bed on phone, states she is feeling better than she was. Respirations even and unlabored.

## 2020-12-07 NOTE — ED Provider Notes (Signed)
[facility] DEPT Provider Note   CSN: [id number] Arrival date & time: 12/07/20  4098     History Chief Complaint  Patient presents with  . Sickle Cell Pain Crisis    Krista [name] is a 35 y.o. female presenting for evaluation of joint pain and muscle spasms.   Patient states she worked out yesterday.  She thinks this triggered a sickle cell pain crisis.  Since then, she has had pain in her joints including ankles, knees, hips, elbows, shoulders.  This is typical of her sickle cell pain crisis.  She took ibuprofen last night with minimal improvement, however this morning, she had severe pain.  She did take a dose of home oxycodone at 6 AM this morning.  She takes as needed, not every day.  This did not improve her pain.  She reports continued severe pain.  No fevers, chills, chest pain, shortness of breath, cough, nausea, vomiting.  She has no other significant medical problems.  Patient states she is also having intermittent muscle spasms, mostly in her upper arms and lower legs.  Additional history obtained in chart review.  Patient with a history of sickle cell, GERD.  HPI     Past Medical History:  Diagnosis Date  . Chronic pain    with sickle cell   . Eczema   . Heart murmur    07-13-2019  per pt since childhood,  has not ever had a echo done ,  asymptomatic  . History of blood transfusion    x3   . Missed ab   . Sickle cell anemia ([place])    followed by pcp---  last crisis 12-14-2018  . Vitamin D deficiency   . Wears glasses     Patient Active Problem List   Diagnosis Date Noted  . Healthcare-associated pneumonia 07/29/2020  . Term pregnancy 06/03/2020  . Sickle cell pain crisis ([place]) 12/14/2018  . Pregnancy 05/04/2018  . Amenorrhea 06/11/2017  . Cellulitis of left upper arm 01/11/2017  . Hb-SS disease without crisis ([place]) 08/12/2015  . Thrombocytosis 08/12/2015  . Gastroesophageal reflux disease without esophagitis 08/12/2015  .  Sickle cell crisis ([place]) 02/03/2013    Past Surgical History:  Procedure Laterality Date  . EYE SURGERY Left 11/12/2019  . LAPAROSCOPIC CHOLECYSTECTOMY  1997     OB History    Gravida  5   Para  2   Term  2   Preterm      AB  3   Living  2     SAB  3   IAB      Ectopic      Multiple  0   Live Births  2           Family History  Problem Relation Age of Onset  . Diabetes Father   . Hypertension Father   . Heart disease Brother   . Cancer Maternal Grandmother   . Heart disease Brother   . Breast cancer Paternal Aunt   . Cancer Paternal Uncle     Social History   Tobacco Use  . Smoking status: Never Smoker  . Smokeless tobacco: Never Used  Vaping Use  . Vaping Use: Never used  Substance Use Topics  . Alcohol use: Not Currently    Comment: socially  . Drug use: Never    Home Medications Prior to Admission medications   Medication Sig Start Date End Date Taking? Authorizing Provider  aspirin EC 81 MG tablet Take 1 tablet (81  mg total) by mouth daily. 09/05/20  Yes Dorena Dew, FNP  Bromfenac Sodium (PROLENSA) 0.07 % SOLN Place 1 drop into the left eye 4 (four) times daily. 09/03/20  Yes Bernarda Caffey, MD  dorzolamide-timolol (COSOPT) 22.3-6.8 MG/ML ophthalmic solution Place 1 drop into the left eye 2 (two) times daily. 08/27/20  Yes Bernarda Caffey, MD  ergocalciferol (VITAMIN D2) 1.25 MG (50000 UT) capsule Take 1 capsule (50,000 Units total) by mouth once a week. Patient taking differently: Take 50,000 Units by mouth every Monday. 11/12/20  Yes Dorena Dew, FNP  folic acid (FOLVITE) 1 MG tablet Take 1 tablet (1 mg total) by mouth daily. 07/29/20  Yes Dorena Dew, FNP  ibuprofen (ADVIL) 800 MG tablet Take 1 tablet (800 mg total) by mouth every 8 (eight) hours as needed. Patient taking differently: Take 800 mg by mouth every 8 (eight) hours as needed for moderate pain. 07/29/20  Yes Dorena Dew, FNP  ketorolac (ACULAR) 0.5 %  ophthalmic solution Place 1 drop into the left eye 4 (four) times daily. 02/25/20 02/24/21 Yes Bernarda Caffey, MD  mometasone (ELOCON) 0.1 % cream Apply 1 application topically as needed. Patient taking differently: Apply 1 application topically as needed (rashes). 08/21/19  Yes Dorena Dew, FNP  oxycodone (OXY-IR) 5 MG capsule Take 1 capsule (5 mg total) by mouth every 4 (four) hours as needed. Patient taking differently: Take 5 mg by mouth every 4 (four) hours as needed for pain. 07/31/20  Yes Dorena Dew, FNP  prednisoLONE acetate (PRED FORTE) 1 % ophthalmic suspension Place 1 drop into the left eye 4 (four) times daily. 08/27/20  Yes Bernarda Caffey, MD  Prenatal Vit-Fe Fumarate-FA (PRENATAL MULTIVITAMIN) TABS tablet Take 1 tablet by mouth daily at 12 noon.   Yes [provider]  norethindrone (MICRONOR) 0.35 MG tablet Take 1 tablet by mouth daily. 12/03/20   [provider]    Allergies    Keflex [cephalexin]  Review of Systems   Review of Systems  Musculoskeletal: Positive for arthralgias and myalgias.  All other systems reviewed and are negative.   Physical Exam Updated Vital Signs BP (!) 133/92   Pulse 72   Temp (!) 97.5 F (36.4 C) (Oral)   Resp (!) 9   Ht 5\' 7"  (1.702 m)   Wt 69.5 kg   SpO2 98%   BMI 23.98 kg/m   Physical Exam Vitals and nursing note reviewed.  Constitutional:      General: She is not in acute distress.    Appearance: She is well-developed and well-nourished.     Comments: Appears uncomfortable due to pain, otherwise nontoxic.   HENT:     Head: Normocephalic and atraumatic.  Eyes:     Extraocular Movements: Extraocular movements intact and EOM normal.     Conjunctiva/sclera: Conjunctivae normal.     Pupils: Pupils are equal, round, and reactive to light.  Cardiovascular:     Rate and Rhythm: Normal rate and regular rhythm.     Pulses: Normal pulses and intact distal pulses.  Pulmonary:     Effort: Pulmonary effort is  normal. No respiratory distress.     Breath sounds: Normal breath sounds. No wheezing.  Abdominal:     General: There is no distension.     Palpations: Abdomen is soft. There is no mass.     Tenderness: There is no abdominal tenderness. There is no guarding or rebound.  Musculoskeletal:        General: Normal  range of motion.     Cervical back: Normal range of motion and neck supple.     Comments: No specific joint swelling or erythema. radial and pedal pulses 2+ bilaterally.   Skin:    General: Skin is warm and dry.     Capillary Refill: Capillary refill takes less than 2 seconds.  Neurological:     Mental Status: She is alert and oriented to person, place, and time.  Psychiatric:        Mood and Affect: Mood and affect normal.     ED Results / Procedures / Treatments   Labs (all labs ordered are listed, but only abnormal results are displayed) Labs Reviewed  CBC WITH DIFFERENTIAL/PLATELET - Abnormal; Notable for the following components:      Result Value   WBC 21.0 (*)    RBC 2.38 (*)    Hemoglobin 7.5 (*)    HCT 21.5 (*)    RDW 18.3 (*)    Platelets 810 (*)    nRBC 1.5 (*)    Neutro Abs 15.7 (*)    Monocytes Absolute 1.8 (*)    Abs Immature Granulocytes 0.37 (*)    All other components within normal limits  RETICULOCYTES - Abnormal; Notable for the following components:   Retic Ct Pct 12.3 (*)    RBC. 2.43 (*)    Immature Retic Fract 25.3 (*)    All other components within normal limits  COMPREHENSIVE METABOLIC PANEL - Abnormal; Notable for the following components:   Chloride 113 (*)    CO2 21 (*)    Total Bilirubin 3.2 (*)    All other components within normal limits  I-STAT BETA HCG BLOOD, ED ([facility], [facility], [facility] ONLY)    EKG None  Radiology No results found.  Procedures Procedures   Medications Ordered in ED Medications  0.45 % sodium chloride infusion ( Intravenous New Bag/Given 12/07/20 0744)  ketorolac (TORADOL) 15 MG/ML injection 15 mg (15 mg  Intravenous Given 12/07/20 0743)  HYDROmorphone (DILAUDID) injection 2 mg (2 mg Intravenous Given 12/07/20 0743)  HYDROmorphone (DILAUDID) injection 2 mg (2 mg Intravenous Given 12/07/20 8850)    ED Course  I have reviewed the triage vital signs and the nursing notes.  Pertinent labs & imaging results that were available during my care of the patient were reviewed by me and considered in my medical decision making (see chart for details).    MDM Rules/Calculators/A&P                          Patient presenting for evaluation of joint pain and muscle spasms.  Consistent with her sickle cell pain crises.  On exam, patient appears uncomfortable due to pain, otherwise nontoxic.  No specific joint swelling or erythema consistent with a septic joint.  Radial and pedal pulses 2+ bilaterally.  No chest pain, shortness of breath, cough, or fever to indicate acute chest.  Will obtain labs to check hemoglobin and electrolytes due to pain and muscle spasm.  Will treat symptomatically with half-normal saline and pain control.  Labs interpreted by me, overall reassuring.  She does have anemia, stable per chart review.  She does have significant leukocytosis of 21.  I confirmed with patient that she does not have any infectious symptoms including fever, cough, nasal congestion, urinary symptoms, abnormal bowel movements.  I discussed her elevated white count today, and importance of proper return to the ER with any infectious symptoms.  Patient reports  pain is much improved, she feels well enough to go home.  I discussed continued the pain medication, follow closely with PCP.  Discussed prompt return to the ER with uncontrollable pain.  At this time, patient appears safe for discharge.  Return precautions given.  Patient states she understands and agrees to plan.  Final Clinical Impression(s) / ED Diagnoses Final diagnoses:  Sickle cell pain crisis ([place])  Leukocytosis, unspecified type    Rx / DC Orders ED  Discharge Orders    None       Franchot Heidelberg, PA-C 12/07/20 1025    Tegeler, Gwenyth Allegra, MD 12/07/20 [phone]

## 2020-12-08 ENCOUNTER — Inpatient Hospital Stay (HOSPITAL_COMMUNITY)
Admission: EM | Admit: 2020-12-08 | Discharge: 2020-12-11 | DRG: 812 | Disposition: A | Discharge status: Home / Self Care | Payer: BC Managed Care – PPO | Attending: Internal Medicine | Admitting: Internal Medicine

## 2020-12-08 ENCOUNTER — Emergency Department (HOSPITAL_COMMUNITY): Payer: BC Managed Care – PPO

## 2020-12-08 ENCOUNTER — Encounter (HOSPITAL_COMMUNITY): Payer: Self-pay

## 2020-12-08 ENCOUNTER — Other Ambulatory Visit: Payer: Self-pay

## 2020-12-08 DIAGNOSIS — D571 Sickle-cell disease without crisis: Secondary | ICD-10-CM

## 2020-12-08 DIAGNOSIS — D5700 Hb-SS disease with crisis, unspecified: Principal | ICD-10-CM | POA: Diagnosis present

## 2020-12-08 DIAGNOSIS — D75839 Thrombocytosis, unspecified: Secondary | ICD-10-CM | POA: Diagnosis present

## 2020-12-08 DIAGNOSIS — R079 Chest pain, unspecified: Secondary | ICD-10-CM

## 2020-12-08 DIAGNOSIS — Z8616 Personal history of COVID-19: Secondary | ICD-10-CM

## 2020-12-08 DIAGNOSIS — Z8701 Personal history of pneumonia (recurrent): Secondary | ICD-10-CM

## 2020-12-08 DIAGNOSIS — Z79899 Other long term (current) drug therapy: Secondary | ICD-10-CM

## 2020-12-08 DIAGNOSIS — Z791 Long term (current) use of non-steroidal anti-inflammatories (NSAID): Secondary | ICD-10-CM

## 2020-12-08 DIAGNOSIS — Z881 Allergy status to other antibiotic agents status: Secondary | ICD-10-CM

## 2020-12-08 DIAGNOSIS — Z7982 Long term (current) use of aspirin: Secondary | ICD-10-CM

## 2020-12-08 DIAGNOSIS — K219 Gastro-esophageal reflux disease without esophagitis: Secondary | ICD-10-CM | POA: Diagnosis present

## 2020-12-08 DIAGNOSIS — G894 Chronic pain syndrome: Secondary | ICD-10-CM | POA: Diagnosis present

## 2020-12-08 DIAGNOSIS — D72829 Elevated white blood cell count, unspecified: Secondary | ICD-10-CM

## 2020-12-08 DIAGNOSIS — Z20822 Contact with and (suspected) exposure to covid-19: Secondary | ICD-10-CM | POA: Diagnosis present

## 2020-12-08 LAB — I-STAT BETA HCG BLOOD, ED (MC, WL, AP ONLY): I-stat hCG, quantitative: <5 m[IU]/mL (ref <5)

## 2020-12-08 MED ORDER — KETOROLAC TROMETHAMINE 15 MG/ML IJ SOLN
15.0000 mg | INTRAMUSCULAR | Status: AC
  Administered 2020-12-08: 15 mg via INTRAVENOUS
  Filled 2020-12-08: qty 1

## 2020-12-08 MED ORDER — SODIUM CHLORIDE 0.45 % IV SOLN: INTRAVENOUS | Status: DC

## 2020-12-08 MED ORDER — HYDROMORPHONE HCL 2 MG/ML IJ SOLN
2.0000 mg | INTRAMUSCULAR | Status: AC
  Administered 2020-12-09: 2 mg via INTRAVENOUS
  Filled 2020-12-08: qty 1

## 2020-12-08 MED ORDER — ONDANSETRON HCL 4 MG/2ML IJ SOLN
4.0000 mg | INTRAMUSCULAR | Status: DC | PRN
  Administered 2020-12-08: 4 mg via INTRAVENOUS
  Filled 2020-12-08: qty 2

## 2020-12-08 MED ORDER — HYDROMORPHONE HCL 2 MG/ML IJ SOLN
2.0000 mg | INTRAMUSCULAR | Status: AC
  Administered 2020-12-08: 2 mg via INTRAVENOUS
  Filled 2020-12-08: qty 1

## 2020-12-08 NOTE — ED Triage Notes (Signed)
Pt arrived via walk in [facility], started Saturday night, diffuse body pain. Worsening pain since.

## 2020-12-09 ENCOUNTER — Other Ambulatory Visit: Payer: Self-pay

## 2020-12-09 DIAGNOSIS — D75839 Thrombocytosis, unspecified: Secondary | ICD-10-CM | POA: Diagnosis present

## 2020-12-09 DIAGNOSIS — Z79899 Other long term (current) drug therapy: Secondary | ICD-10-CM | POA: Diagnosis not present

## 2020-12-09 DIAGNOSIS — D571 Sickle-cell disease without crisis: Secondary | ICD-10-CM

## 2020-12-09 DIAGNOSIS — Z791 Long term (current) use of non-steroidal anti-inflammatories (NSAID): Secondary | ICD-10-CM | POA: Diagnosis not present

## 2020-12-09 DIAGNOSIS — Z7982 Long term (current) use of aspirin: Secondary | ICD-10-CM | POA: Diagnosis not present

## 2020-12-09 DIAGNOSIS — Z881 Allergy status to other antibiotic agents status: Secondary | ICD-10-CM | POA: Diagnosis not present

## 2020-12-09 DIAGNOSIS — D72829 Elevated white blood cell count, unspecified: Secondary | ICD-10-CM

## 2020-12-09 DIAGNOSIS — Z20822 Contact with and (suspected) exposure to covid-19: Secondary | ICD-10-CM | POA: Diagnosis present

## 2020-12-09 DIAGNOSIS — K219 Gastro-esophageal reflux disease without esophagitis: Secondary | ICD-10-CM | POA: Diagnosis present

## 2020-12-09 DIAGNOSIS — D5700 Hb-SS disease with crisis, unspecified: Secondary | ICD-10-CM | POA: Diagnosis present

## 2020-12-09 DIAGNOSIS — Z8701 Personal history of pneumonia (recurrent): Secondary | ICD-10-CM | POA: Diagnosis not present

## 2020-12-09 DIAGNOSIS — G894 Chronic pain syndrome: Secondary | ICD-10-CM | POA: Diagnosis present

## 2020-12-09 DIAGNOSIS — R079 Chest pain, unspecified: Secondary | ICD-10-CM

## 2020-12-09 DIAGNOSIS — Z8616 Personal history of COVID-19: Secondary | ICD-10-CM | POA: Diagnosis not present

## 2020-12-09 LAB — COMPREHENSIVE METABOLIC PANEL
ALT: 14 U/L (ref 0–44)
AST: 16 U/L (ref 15–41)
Albumin: 4.4 g/dL (ref 3.5–5.0)
Alkaline Phosphatase: 61 U/L (ref 38–126)
Anion gap: 9 (ref 5–15)
BUN: 8 mg/dL (ref 6–20)
CO2: 25 mmol/L (ref 22–32)
Calcium: 8.7 mg/dL — ABNORMAL LOW (ref 8.9–10.3)
Chloride: 107 mmol/L (ref 98–111)
Creatinine, Ser: 0.43 mg/dL — ABNORMAL LOW (ref 0.44–1.00)
GFR, Estimated: >60 mL/min (ref >60)
Glucose, Bld: 85 mg/dL (ref 70–99)
Potassium: 3.5 mmol/L (ref 3.5–5.1)
Sodium: 141 mmol/L (ref 135–145)
Total Bilirubin: 3.5 mg/dL — ABNORMAL HIGH (ref 0.3–1.2)
Total Protein: 7.2 g/dL (ref 6.5–8.1)

## 2020-12-09 LAB — URINALYSIS, ROUTINE W REFLEX MICROSCOPIC
Bilirubin Urine: NEGATIVE
Glucose, UA: NEGATIVE mg/dL
Hgb urine dipstick: NEGATIVE
Ketones, ur: NEGATIVE mg/dL
Leukocytes,Ua: NEGATIVE
Nitrite: NEGATIVE
Protein, ur: NEGATIVE mg/dL
Specific Gravity, Urine: 1.011 (ref 1.005–1.030)
pH: 5 (ref 5.0–8.0)

## 2020-12-09 LAB — CBC WITH DIFFERENTIAL/PLATELET
Abs Immature Granulocytes: 0.16 10*3/uL — ABNORMAL HIGH (ref 0.00–0.07)
Basophils Absolute: 0.1 10*3/uL (ref 0.0–0.1)
Basophils Relative: 1 %
Eosinophils Absolute: 0.1 10*3/uL (ref 0.0–0.5)
Eosinophils Relative: 0 %
HCT: 20 % — ABNORMAL LOW (ref 36.0–46.0)
Hemoglobin: 6.7 g/dL — CL (ref 12.0–15.0)
Immature Granulocytes: 1 %
Lymphocytes Relative: 31 %
Lymphs Abs: 5 10*3/uL — ABNORMAL HIGH (ref 0.7–4.0)
MCH: 31.5 pg (ref 26.0–34.0)
MCHC: 33.5 g/dL (ref 30.0–36.0)
MCV: 93.9 fL (ref 80.0–100.0)
Monocytes Absolute: 1.8 10*3/uL — ABNORMAL HIGH (ref 0.1–1.0)
Monocytes Relative: 11 %
Neutro Abs: 9.1 10*3/uL — ABNORMAL HIGH (ref 1.7–7.7)
Neutrophils Relative %: 56 %
Platelets: 750 10*3/uL — ABNORMAL HIGH (ref 150–400)
RBC: 2.13 MIL/uL — ABNORMAL LOW (ref 3.87–5.11)
RDW: 19.9 % — ABNORMAL HIGH (ref 11.5–15.5)
WBC: 16.2 10*3/uL — ABNORMAL HIGH (ref 4.0–10.5)
nRBC: 3.3 % — ABNORMAL HIGH (ref 0.0–0.2)

## 2020-12-09 LAB — CBC
HCT: 21 % — ABNORMAL LOW (ref 36.0–46.0)
Hemoglobin: 7 g/dL — ABNORMAL LOW (ref 12.0–15.0)
MCH: 30.8 pg (ref 26.0–34.0)
MCHC: 33.3 g/dL (ref 30.0–36.0)
MCV: 92.5 fL (ref 80.0–100.0)
Platelets: 674 10*3/uL — ABNORMAL HIGH (ref 150–400)
RBC: 2.27 MIL/uL — ABNORMAL LOW (ref 3.87–5.11)
RDW: 19.9 % — ABNORMAL HIGH (ref 11.5–15.5)
WBC: 15.8 10*3/uL — ABNORMAL HIGH (ref 4.0–10.5)
nRBC: 4.3 % — ABNORMAL HIGH (ref 0.0–0.2)

## 2020-12-09 LAB — RESP PANEL BY RT-PCR (FLU A&B, COVID) ARPGX2
Influenza A by PCR: NEGATIVE
Influenza B by PCR: NEGATIVE
SARS Coronavirus 2 by RT PCR: NEGATIVE

## 2020-12-09 LAB — CK: Total CK: 25 U/L — ABNORMAL LOW (ref 38–234)

## 2020-12-09 LAB — RETICULOCYTES
Immature Retic Fract: 36.2 % — ABNORMAL HIGH (ref 2.3–15.9)
RBC.: 2.14 MIL/uL — ABNORMAL LOW (ref 3.87–5.11)
Retic Count, Absolute: 301 10*3/uL — ABNORMAL HIGH (ref 19.0–186.0)
Retic Ct Pct: 15.4 % — ABNORMAL HIGH (ref 0.4–3.1)

## 2020-12-09 LAB — TROPONIN I (HIGH SENSITIVITY): Troponin I (High Sensitivity): 4 ng/L (ref <18)

## 2020-12-09 LAB — PREPARE RBC (CROSSMATCH)

## 2020-12-09 MED ORDER — SENNOSIDES-DOCUSATE SODIUM 8.6-50 MG PO TABS
1.0000 | ORAL_TABLET | Freq: Two times a day (BID) | ORAL | Status: DC
  Administered 2020-12-09 – 2020-12-11 (×5): 1 via ORAL
  Filled 2020-12-09 (×5): qty 1

## 2020-12-09 MED ORDER — FOLIC ACID 1 MG PO TABS
1.0000 mg | ORAL_TABLET | Freq: Every day | ORAL | Status: DC
  Administered 2020-12-09 – 2020-12-11 (×3): 1 mg via ORAL
  Filled 2020-12-09 (×3): qty 1

## 2020-12-09 MED ORDER — HYDROMORPHONE HCL 1 MG/ML IJ SOLN
1.0000 mg | INTRAMUSCULAR | Status: DC | PRN
  Administered 2020-12-09 (×2): 1 mg via INTRAVENOUS
  Filled 2020-12-09 (×2): qty 1

## 2020-12-09 MED ORDER — HYDROMORPHONE HCL 2 MG/ML IJ SOLN
2.0000 mg | Freq: Once | INTRAMUSCULAR | Status: AC
  Administered 2020-12-09: 2 mg via INTRAVENOUS
  Filled 2020-12-09: qty 1

## 2020-12-09 MED ORDER — POLYETHYLENE GLYCOL 3350 17 G PO PACK
17.0000 g | PACK | Freq: Every day | ORAL | Status: DC | PRN
  Administered 2020-12-10: 17 g via ORAL

## 2020-12-09 MED ORDER — SODIUM CHLORIDE 0.45 % IV SOLN: INTRAVENOUS | Status: DC

## 2020-12-09 MED ORDER — HYDROMORPHONE 1 MG/ML IV SOLN
INTRAVENOUS | Status: DC
  Administered 2020-12-09: 30 mg via INTRAVENOUS
  Administered 2020-12-09: 0.5 mg via INTRAVENOUS
  Administered 2020-12-10: 0.9 mg via INTRAVENOUS
  Administered 2020-12-10: 0.3 mg via INTRAVENOUS
  Administered 2020-12-10: 0.6 mg via INTRAVENOUS
  Administered 2020-12-10: 0 mg via INTRAVENOUS
  Filled 2020-12-09: qty 30

## 2020-12-09 MED ORDER — ONDANSETRON HCL 4 MG/2ML IJ SOLN
4.0000 mg | Freq: Four times a day (QID) | INTRAMUSCULAR | Status: DC | PRN
  Administered 2020-12-09 – 2020-12-10 (×3): 4 mg via INTRAVENOUS
  Filled 2020-12-09 (×3): qty 2

## 2020-12-09 MED ORDER — KETOROLAC TROMETHAMINE 15 MG/ML IJ SOLN
15.0000 mg | Freq: Four times a day (QID) | INTRAMUSCULAR | Status: DC
  Administered 2020-12-09 – 2020-12-11 (×8): 15 mg via INTRAVENOUS
  Filled 2020-12-09 (×8): qty 1

## 2020-12-09 MED ORDER — NALOXONE HCL 0.4 MG/ML IJ SOLN: 0.4000 mg | INTRAMUSCULAR | Status: DC | PRN

## 2020-12-09 MED ORDER — SODIUM CHLORIDE 0.9 % IV SOLN
10.0000 mL/h | Freq: Once | INTRAVENOUS | Status: AC
  Administered 2020-12-09: 10 mL/h via INTRAVENOUS

## 2020-12-09 MED ORDER — DIPHENHYDRAMINE HCL 50 MG/ML IJ SOLN: 12.5000 mg | Freq: Four times a day (QID) | INTRAMUSCULAR | Status: DC | PRN

## 2020-12-09 MED ORDER — DIPHENHYDRAMINE HCL 50 MG/ML IJ SOLN
12.5000 mg | Freq: Once | INTRAMUSCULAR | Status: AC
  Administered 2020-12-09: 12.5 mg via INTRAVENOUS
  Filled 2020-12-09: qty 1

## 2020-12-09 MED ORDER — DIPHENHYDRAMINE HCL 12.5 MG/5ML PO ELIX: 12.5000 mg | ORAL_SOLUTION | Freq: Four times a day (QID) | ORAL | Status: DC | PRN

## 2020-12-09 MED ORDER — SODIUM CHLORIDE 0.9% FLUSH: 9.0000 mL | INTRAVENOUS | Status: DC | PRN

## 2020-12-09 NOTE — ED Provider Notes (Signed)
[facility] DEPT Provider Note   CSN: [id number] Arrival date & time: 12/08/20  1807     History Chief Complaint  Patient presents with  . Sickle Cell Pain Crisis    Krista [name] is a 35 y.o. female.  The history is provided by the patient and medical records.  Sickle Cell Pain Crisis   35 year old female with history of sickle cell disease, eczema, presenting to the ED with pain crisis.  States she was here yesterday for same and felt somewhat better but pain has returned.  States she feels pain in her shoulders and her knees mostly which can be common for her.  Today she did feel little bit of chest pain but denies any cough or fever.  No shortness of breath.  She has no history of acute chest syndrome.  She denies any nausea or vomiting.  States she is not sure why now she is having such a hard time with her sickle cell crisis since before the age of 63 she really had crises, may be 3-4 total that she can recall.  Past Medical History:  Diagnosis Date  . Chronic pain    with sickle cell   . Eczema   . Heart murmur    07-13-2019  per pt since childhood,  has not ever had a echo done ,  asymptomatic  . History of blood transfusion    x3   . Missed ab   . Sickle cell anemia ([place])    followed by pcp---  last crisis 12-14-2018  . Vitamin D deficiency   . Wears glasses     Patient Active Problem List   Diagnosis Date Noted  . Healthcare-associated pneumonia 07/29/2020  . Term pregnancy 06/03/2020  . Sickle cell pain crisis ([place]) 12/14/2018  . Pregnancy 05/04/2018  . Amenorrhea 06/11/2017  . Cellulitis of left upper arm 01/11/2017  . 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 Thrombocytosis 08/12/2015  . Gastroesophageal reflux disease without esophagitis 08/12/2015  . Sickle cell crisis ([place]) 02/03/2013    Past Surgical History:  Procedure Laterality Date  . EYE SURGERY Left 11/12/2019  . LAPAROSCOPIC CHOLECYSTECTOMY  1997      OB History    Gravida  5   Para  2   Term  2   Preterm      AB  3   Living  2     SAB  3   IAB      Ectopic      Multiple  0   Live Births  2           Family History  Problem Relation Age of Onset  . Diabetes Father   . Hypertension Father   . Heart disease Brother   . Cancer Maternal Grandmother   . Heart disease Brother   . Breast cancer Paternal Aunt   . Cancer Paternal Uncle     Social History   Tobacco Use  . Smoking status: Never Smoker  . Smokeless tobacco: Never Used  Vaping Use  . Vaping Use: Never used  Substance Use Topics  . Alcohol use: Not Currently    Comment: socially  . Drug use: Never    Home Medications Prior to Admission medications   Medication Sig Start Date End Date Taking? Authorizing Provider  aspirin EC 81 MG tablet Take 1 tablet (81 mg total) by mouth daily. 09/05/20   Dorena Dew, FNP  Bromfenac Sodium (PROLENSA) 0.07 % SOLN Place  1 drop into the left eye 4 (four) times daily. 09/03/20   Bernarda Caffey, MD  dorzolamide-timolol (COSOPT) 22.3-6.8 MG/ML ophthalmic solution Place 1 drop into the left eye 2 (two) times daily. 08/27/20   Bernarda Caffey, MD  ergocalciferol (VITAMIN D2) 1.25 MG (50000 UT) capsule Take 1 capsule (50,000 Units total) by mouth once a week. Patient taking differently: Take 50,000 Units by mouth every Monday. 11/12/20   Dorena Dew, FNP  folic acid (FOLVITE) 1 MG tablet Take 1 tablet (1 mg total) by mouth daily. 07/29/20   Dorena Dew, FNP  ibuprofen (ADVIL) 800 MG tablet Take 1 tablet (800 mg total) by mouth every 8 (eight) hours as needed. Patient taking differently: Take 800 mg by mouth every 8 (eight) hours as needed for moderate pain. 07/29/20   Dorena Dew, FNP  ketorolac (ACULAR) 0.5 % ophthalmic solution Place 1 drop into the left eye 4 (four) times daily. 02/25/20 02/24/21  Bernarda Caffey, MD  mometasone (ELOCON) 0.1 % cream Apply 1 application topically as  needed. Patient taking differently: Apply 1 application topically as needed (rashes). 08/21/19   Dorena Dew, FNP  norethindrone (MICRONOR) 0.35 MG tablet Take 1 tablet by mouth daily. 12/03/20   [provider]  oxycodone (OXY-IR) 5 MG capsule Take 1 capsule (5 mg total) by mouth every 4 (four) hours as needed. Patient taking differently: Take 5 mg by mouth every 4 (four) hours as needed for pain. 07/31/20   Dorena Dew, FNP  prednisoLONE acetate (PRED FORTE) 1 % ophthalmic suspension Place 1 drop into the left eye 4 (four) times daily. 08/27/20   Bernarda Caffey, MD  Prenatal Vit-Fe Fumarate-FA (PRENATAL MULTIVITAMIN) TABS tablet Take 1 tablet by mouth daily at 12 noon.    [provider]    Allergies    Keflex [cephalexin]  Review of Systems   Review of Systems  Musculoskeletal: Positive for arthralgias.  All other systems reviewed and are negative.   Physical Exam Updated Vital Signs BP (!) 128/93 (BP Location: Left Arm)   Pulse 99   Temp 98 F (36.7 C) (Oral)   Resp 20   LMP 12/05/2020   SpO2 100%   Physical Exam Vitals and nursing note reviewed.  Constitutional:      Appearance: She is well-developed and well-nourished.  HENT:     Head: Normocephalic and atraumatic.     Mouth/Throat:     Mouth: Oropharynx is clear and moist.  Eyes:     Extraocular Movements: EOM normal.     Conjunctiva/sclera: Conjunctivae normal.     Pupils: Pupils are equal, round, and reactive to light.  Cardiovascular:     Rate and Rhythm: Normal rate and regular rhythm.     Heart sounds: Normal heart sounds.  Pulmonary:     Effort: Pulmonary effort is normal. No respiratory distress.     Breath sounds: Normal breath sounds. No rhonchi.  Abdominal:     General: Bowel sounds are normal.     Palpations: Abdomen is soft.     Tenderness: There is no abdominal tenderness.  Musculoskeletal:        General: Normal range of motion.     Cervical back: Normal range of  motion.  Skin:    General: Skin is warm and dry.  Neurological:     Mental Status: She is alert and oriented to person, place, and time.  Psychiatric:        Mood and Affect: Mood and affect  normal.     ED Results / Procedures / Treatments   Labs (all labs ordered are listed, but only abnormal results are displayed) Labs Reviewed  CBC WITH DIFFERENTIAL/PLATELET - Abnormal; Notable for the following components:      Result Value   WBC 16.2 (*)    RBC 2.13 (*)    Hemoglobin 6.7 (*)    HCT 20.0 (*)    RDW 19.9 (*)    Platelets 750 (*)    nRBC 3.3 (*)    Neutro Abs 9.1 (*)    Lymphs Abs 5.0 (*)    Monocytes Absolute 1.8 (*)    Abs Immature Granulocytes 0.16 (*)    All other components within normal limits  COMPREHENSIVE METABOLIC PANEL - Abnormal; Notable for the following components:   Creatinine, Ser 0.43 (*)    Calcium 8.7 (*)    Total Bilirubin 3.5 (*)    All other components within normal limits  RETICULOCYTES - Abnormal; Notable for the following components:   Retic Ct Pct 15.4 (*)    RBC. 2.14 (*)    Retic Count, Absolute 301.0 (*)    Immature Retic Fract 36.2 (*)    All other components within normal limits  RESP PANEL BY RT-PCR (FLU A&B, COVID) ARPGX2  URINALYSIS, ROUTINE W REFLEX MICROSCOPIC  I-STAT BETA HCG BLOOD, ED ([facility], [facility], [facility] ONLY)  TYPE AND SCREEN  PREPARE RBC (CROSSMATCH)    EKG None  Radiology DG Chest 2 View  Result Date: 12/09/2020 CLINICAL DATA:  Chest pain, history of sickle cell EXAM: CHEST - 2 VIEW COMPARISON:  Radiograph 11/11/2020 FINDINGS: No consolidation, features of edema, pneumothorax, or effusion. Pulmonary vascularity is normally distributed. The cardiomediastinal contours are unremarkable. No acute osseous or soft tissue abnormality. Cholecystectomy clips in the right upper quadrant. Telemetry leads overlie the chest. IMPRESSION: No acute cardiopulmonary abnormality. Electronically Signed   By: Lovena Le M.D.   On: 12/09/2020 00:13     Procedures Procedures   CRITICAL CARE Performed by: Larene Pickett   Total critical care time: 45 minutes  Critical care time was exclusive of separately billable procedures and treating other patients.  Critical care was necessary to treat or prevent imminent or life-threatening deterioration.  Critical care was time spent personally by me on the following activities: development of treatment plan with patient and/or surrogate as well as nursing, discussions with consultants, evaluation of patient's response to treatment, examination of patient, obtaining history from patient or surrogate, ordering and performing treatments and interventions, ordering and review of laboratory studies, ordering and review of radiographic studies, pulse oximetry and re-evaluation of patient's condition.   Medications Ordered in ED Medications  0.45 % sodium chloride infusion ( Intravenous New Bag/Given 12/08/20 2342)  ondansetron (ZOFRAN) injection 4 mg (4 mg Intravenous Given 12/08/20 2338)  0.9 %  sodium chloride infusion (has no administration in time range)  HYDROmorphone (DILAUDID) injection 2 mg (2 mg Intravenous Given 12/08/20 2344)  HYDROmorphone (DILAUDID) injection 2 mg (2 mg Intravenous Given 12/09/20 0028)  ketorolac (TORADOL) 15 MG/ML injection 15 mg (15 mg Intravenous Given 12/08/20 2343)  diphenhydrAMINE (BENADRYL) injection 12.5 mg (12.5 mg Intravenous Given 12/09/20 0133)    ED Course  I have reviewed the triage vital signs and the nursing notes.  Pertinent labs & imaging results that were available during my care of the patient were reviewed by me and considered in my medical decision making (see chart for details).    MDM Rules/Calculators/A&P  35 year old female  presenting to the ED with sickle cell pain crisis.  Seen here yesterday for same and feeling better but symptoms have persisted.  States today she started feeling some chest pain which was new.  She denies any cough,  fever, or shortness of breath.  She is afebrile and nontoxic in appearance here.  She has no tachycardia or hypoxia.  No history of PE.  Labs pending.  Will add on chest x-ray and EKG.  Chest x-ray without any acute findings. No signs/symptoms of acute chest.  Labs with hemoglobin of 6.7 today (baseline is around 7-8).  Reports she requires transfusion when dropping below 7.  She has only had transfusion a handful of times in her life, but is comfortable proceeding with this today.  She will require admission for ongoing care.  Of note, patient reports + home covid test on 11/21/20.  She did not have formal PCR testing.  Test here is pending but if positive will be out of the window for quarantine precautions.  Case discussed with hospitalist, Dr. Marlowe Sax-- will admit for ongoing care.  Final Clinical Impression(s) / ED Diagnoses Final diagnoses:  Sickle cell pain crisis [facility])    Rx / DC Orders ED Discharge Orders    None       Larene Pickett, PA-C 12/09/20 0248    Merrily Pew, MD 12/09/20 [phone]

## 2020-12-09 NOTE — ED Notes (Signed)
This RN spoke with blood bank, states due to patient having new antibodies there will be a delay in the unit of PRBC being ready

## 2020-12-09 NOTE — ED Notes (Signed)
A call placed to Dr. Doreene Burke for a diet order. Had to leave a message.

## 2020-12-09 NOTE — ED Notes (Signed)
Verbal order received from [facility], MD for Dilaudid 1 mg IV q2h prn due to unable to start PCA pump in ED.

## 2020-12-09 NOTE — ED Notes (Signed)
Report called and given to receiving nurse.  

## 2020-12-09 NOTE — ED Notes (Signed)
Patient assisted to BR. Patient ambulatory. Patient repositioned in bed and given meal tray.

## 2020-12-09 NOTE — H&P (Signed)
History and Physical    Krista [name] QVZ:[id number] DOB: [birth year] DOA: 12/08/2020  PCP: Dorena Dew, FNP Patient coming from: Home  Chief Complaint: Sickle cell pain  HPI: Krista [name] is a 35 y.o. female with medical history significant of sickle cell anemia, chronic pain syndrome presenting to the ED for evaluation of sickle cell pain. Patient states she did some home video exercises 2 nights ago and soon after started experiencing joint pains in both of her knees, hips, and shoulders.  The pain continued to get worse and she came into the emergency room the following day where she was treated and discharged.  After going home she has continued to have pain despite taking ibuprofen 800 mg every 8 hours and oxycodone 5 mg every 4 hours.  She feels this pain is similar to her prior sickle cell crisis episodes.  Does endorse some mild substernal chest pain which started last night.  Patient states she had Covid infection at the beginning of this month.  She was having cough, congestion, diarrhea at that time and had a positive home COVID test.  She feels that she has now fully recovered and no longer endorsing any symptoms of an acute viral illness.  ED Course: Vital signs stable.  WBC 16.2, hemoglobin 6.7 (baseline 7-8), platelet count 750 (at baseline).  Sodium 141, potassium 3.5, chloride 107, bicarb 25, BUN 8, creatinine 0.4, glucose 85.  Absolute reticulocyte count elevated.  Beta hCG negative.  SARS-CoV-2 PCR test negative.  Influenza panel negative.  UA without signs of infection.  Chest x-ray showing no acute cardiopulmonary abnormality. 1 unit PRBCs ordered.  She was given doses of Dilaudid but continued to endorse pain.  Admission requested for sickle cell pain crisis.  Review of Systems:  All systems reviewed and apart from history of presenting illness, are negative.  Past Medical History:  Diagnosis Date  . Chronic pain    with sickle cell   . Eczema   . Heart murmur     07-13-2019  per pt since childhood,  has not ever had a echo done ,  asymptomatic  . History of blood transfusion    x3   . Missed ab   . Sickle cell anemia ([place])    followed by pcp---  last crisis 12-14-2018  . Vitamin D deficiency   . Wears glasses     Past Surgical History:  Procedure Laterality Date  . EYE SURGERY Left 11/12/2019  . LAPAROSCOPIC CHOLECYSTECTOMY  1997     reports that she has never smoked. She has never used smokeless tobacco. She reports previous alcohol use. She reports that she does not use drugs.  Allergies  Allergen Reactions  . Keflex [Cephalexin] Swelling    Face swelling    Family History  Problem Relation Age of Onset  . Diabetes Father   . Hypertension Father   . Heart disease Brother   . Cancer Maternal Grandmother   . Heart disease Brother   . Breast cancer Paternal Aunt   . Cancer Paternal Uncle     Prior to Admission medications   Medication Sig Start Date End Date Taking? Authorizing Provider  aspirin EC 81 MG tablet Take 1 tablet (81 mg total) by mouth daily. 09/05/20  Yes Dorena Dew, FNP  Bromfenac Sodium (PROLENSA) 0.07 % SOLN Place 1 drop into the left eye 4 (four) times daily. 09/03/20  Yes Bernarda Caffey, MD  cetirizine (ZYRTEC) 10 MG tablet Take 10 mg by mouth daily as  needed for allergies.   Yes [provider]  dorzolamide-timolol (COSOPT) 22.3-6.8 MG/ML ophthalmic solution Place 1 drop into the left eye 2 (two) times daily. 08/27/20  Yes Bernarda Caffey, MD  ergocalciferol (VITAMIN D2) 1.25 MG (50000 UT) capsule Take 1 capsule (50,000 Units total) by mouth once a week. Patient taking differently: Take 50,000 Units by mouth every Monday. 11/12/20  Yes Dorena Dew, FNP  folic acid (FOLVITE) 1 MG tablet Take 1 tablet (1 mg total) by mouth daily. 07/29/20  Yes Dorena Dew, FNP  ibuprofen (ADVIL) 800 MG tablet Take 1 tablet (800 mg total) by mouth every 8 (eight) hours as needed. Patient taking differently:  Take 800 mg by mouth every 8 (eight) hours as needed for moderate pain. 07/29/20  Yes Dorena Dew, FNP  mometasone (ELOCON) 0.1 % cream Apply 1 application topically as needed. Patient taking differently: Apply 1 application topically as needed (rashes). 08/21/19  Yes Dorena Dew, FNP  oxycodone (OXY-IR) 5 MG capsule Take 1 capsule (5 mg total) by mouth every 4 (four) hours as needed. Patient taking differently: Take 5 mg by mouth every 4 (four) hours as needed for pain. 07/31/20  Yes Dorena Dew, FNP  prednisoLONE acetate (PRED FORTE) 1 % ophthalmic suspension Place 1 drop into the left eye 4 (four) times daily. 08/27/20  Yes Bernarda Caffey, MD  Prenatal Vit-Fe Fumarate-FA (PRENATAL MULTIVITAMIN) TABS tablet Take 1 tablet by mouth daily at 12 noon.   Yes [provider]  norethindrone (MICRONOR) 0.35 MG tablet Take 1 tablet by mouth daily. 12/03/20   [provider]    Physical Exam: Vitals:   12/09/20 0400 12/09/20 0500 12/09/20 0600 12/09/20 0700  BP: 120/66 117/76 132/84 128/88  Pulse: 78 76 84 74  Resp: 14 19 16 14   Temp:   98.1 F (36.7 C)   TempSrc:      SpO2: 99% 96% 97% 97%    Physical Exam Constitutional:      General: She is not in acute distress. HENT:     Head: Normocephalic and atraumatic.  Eyes:     Extraocular Movements: Extraocular movements intact.     Conjunctiva/sclera: Conjunctivae normal.  Cardiovascular:     Rate and Rhythm: Normal rate and regular rhythm.     Pulses: Normal pulses.  Pulmonary:     Effort: Pulmonary effort is normal.     Breath sounds: Normal breath sounds.  Abdominal:     General: Bowel sounds are normal. There is no distension.     Palpations: Abdomen is soft.     Tenderness: There is no abdominal tenderness.  Musculoskeletal:        General: No swelling or tenderness.     Cervical back: Normal range of motion and neck supple.  Skin:    General: Skin is warm and dry.  Neurological:     General: No  focal deficit present.     Mental Status: She is alert and oriented to person, place, and time.     Labs on Admission: I have personally reviewed following labs and imaging studies  CBC: Recent Labs  Lab 12/07/20 0712 12/08/20 2319  WBC 21.0* 16.2*  NEUTROABS 15.7* 9.1*  HGB 7.5* 6.7*  HCT 21.5* 20.0*  MCV 90.3 93.9  PLT 810* 161*   Basic Metabolic Panel: Recent Labs  Lab 12/07/20 0712 12/08/20 2319  NA 143 141  K 3.7 3.5  CL 113* 107  CO2 21* 25  GLUCOSE 85 85  BUN 6 8  CREATININE 0.54 0.43*  CALCIUM 9.0 8.7*   GFR: Estimated Creatinine Clearance: 97.3 mL/min (A) (by C-G formula based on SCr of 0.43 mg/dL (L)). Liver Function Tests: Recent Labs  Lab 12/07/20 0712 12/08/20 2319  AST 20 16  ALT 13 14  ALKPHOS 66 61  BILITOT 3.2* 3.5*  PROT 7.5 7.2  ALBUMIN 4.5 4.4   No results for input(s): LIPASE, AMYLASE in the last 168 hours. No results for input(s): AMMONIA in the last 168 hours. Coagulation Profile: No results for input(s): INR, PROTIME in the last 168 hours. Cardiac Enzymes: No results for input(s): CKTOTAL, CKMB, CKMBINDEX, TROPONINI in the last 168 hours. BNP (last 3 results) No results for input(s): PROBNP in the last 8760 hours. HbA1C: No results for input(s): HGBA1C in the last 72 hours. CBG: No results for input(s): GLUCAP in the last 168 hours. Lipid Profile: No results for input(s): CHOL, HDL, LDLCALC, TRIG, CHOLHDL, LDLDIRECT in the last 72 hours. Thyroid Function Tests: No results for input(s): TSH, T4TOTAL, FREET4, T3FREE, THYROIDAB in the last 72 hours. Anemia Panel: Recent Labs    12/07/20 0712 12/08/20 2319  RETICCTPCT 12.3* 15.4*   Urine analysis:    Component Value Date/Time   COLORURINE YELLOW 12/09/2020 0608   APPEARANCEUR CLEAR 12/09/2020 0608   APPEARANCEUR Clear 08/26/2020 1050   LABSPEC 1.011 12/09/2020 0608   PHURINE 5.0 12/09/2020 0608   GLUCOSEU NEGATIVE 12/09/2020 0608   HGBUR NEGATIVE 12/09/2020 0608    BILIRUBINUR NEGATIVE 12/09/2020 0608   BILIRUBINUR neg 11/11/2020 1345   BILIRUBINUR Negative 08/26/2020 [place] 12/09/2020 0608   PROTEINUR NEGATIVE 12/09/2020 0608   UROBILINOGEN 0.2 11/11/2020 1345   UROBILINOGEN 4.0 (H) 06/08/2017 1634   NITRITE NEGATIVE 12/09/2020 0608   LEUKOCYTESUR NEGATIVE 12/09/2020 7124    Radiological Exams on Admission: DG Chest 2 View  Result Date: 12/09/2020 CLINICAL DATA:  Chest pain, history of sickle cell EXAM: CHEST - 2 VIEW COMPARISON:  Radiograph 11/11/2020 FINDINGS: No consolidation, features of edema, pneumothorax, or effusion. Pulmonary vascularity is normally distributed. The cardiomediastinal contours are unremarkable. No acute osseous or soft tissue abnormality. Cholecystectomy clips in the right upper quadrant. Telemetry leads overlie the chest. IMPRESSION: No acute cardiopulmonary abnormality. Electronically Signed   By: Lovena Le M.D.   On: 12/09/2020 00:13    EKG: Independently reviewed.  Sinus rhythm, nonspecific T wave abnormality.  No significant change since prior tracing.  Assessment/Plan Principal Problem:   Sickle cell pain crisis ([place]) Active Problems:   Thrombocytosis   Chest pain   Sickle cell anemia ([facility])   Leukocytosis   Sickle cell pain crisis -Dilaudid PCA.  Toradol 15 mg every 6 hours.  0.45% normal saline at 100 cc/h. -Check CK level as her pain started after doing exercises at home  Sickle cell anemia: Hemoglobin currently 6.7 (baseline 7-8). -1 unit PRBCs ordered in the ED.  Follow posttransfusion H&H.  Chest pain: EKG without acute ischemic changes.  No fever, hypoxia, or infiltrate on chest x-ray to suggest acute chest syndrome. -Cardiac monitoring, check high-sensitivity troponin level  Leukocytosis: Likely reactive.  WBC 16.2.  No infectious signs or symptoms.  UA without signs of infection and chest x-ray not suggestive of pneumonia. -Continue to monitor  Chronic  thrombocytosis -Continue to monitor  DVT prophylaxis: SCDs Code Status: Full code Family Communication: No family at bedside.  Diagnostic findings and treatment plan discussed with the patient. Disposition Plan: Status is: Observation  The patient remains OBS  appropriate and will d/c before 2 midnights.  Dispo: The patient is from: Home              Anticipated d/c is to: Home              Anticipated d/c date is: 1 day              Patient currently is not medically stable to d/c.   Difficult to place patient No  Level of care: Level of care: Med-Surg   The medical decision making on this patient was of high complexity and the patient is at high risk for clinical deterioration, therefore this is a level 3 visit.  Shela Leff MD [facility]  If 7PM-7AM, please contact night-coverage [email or web address]  12/09/2020, 7:18 AM

## 2020-12-10 DIAGNOSIS — D5700 Hb-SS disease with crisis, unspecified: Secondary | ICD-10-CM | POA: Diagnosis not present

## 2020-12-10 LAB — CBC WITH DIFFERENTIAL/PLATELET
Abs Immature Granulocytes: 0.15 10*3/uL — ABNORMAL HIGH (ref 0.00–0.07)
Basophils Absolute: 0.1 10*3/uL (ref 0.0–0.1)
Basophils Relative: 1 %
Eosinophils Absolute: 0.3 10*3/uL (ref 0.0–0.5)
Eosinophils Relative: 2 %
HCT: 23.8 % — ABNORMAL LOW (ref 36.0–46.0)
Hemoglobin: 7.7 g/dL — ABNORMAL LOW (ref 12.0–15.0)
Immature Granulocytes: 1 %
Lymphocytes Relative: 36 %
Lymphs Abs: 4.6 10*3/uL — ABNORMAL HIGH (ref 0.7–4.0)
MCH: 30.1 pg (ref 26.0–34.0)
MCHC: 32.4 g/dL (ref 30.0–36.0)
MCV: 93 fL (ref 80.0–100.0)
Monocytes Absolute: 1.1 10*3/uL — ABNORMAL HIGH (ref 0.1–1.0)
Monocytes Relative: 8 %
Neutro Abs: 6.6 10*3/uL (ref 1.7–7.7)
Neutrophils Relative %: 52 %
Platelets: 713 10*3/uL — ABNORMAL HIGH (ref 150–400)
RBC: 2.56 MIL/uL — ABNORMAL LOW (ref 3.87–5.11)
RDW: 20.7 % — ABNORMAL HIGH (ref 11.5–15.5)
WBC: 12.9 10*3/uL — ABNORMAL HIGH (ref 4.0–10.5)
nRBC: 8.5 % — ABNORMAL HIGH (ref 0.0–0.2)

## 2020-12-10 LAB — TYPE AND SCREEN
ABO/RH(D): B POS
Antibody Screen: POSITIVE
Donor AG Type: NEGATIVE
Unit division: 0

## 2020-12-10 LAB — BPAM RBC
Blood Product Expiration Date: 202203242359
ISSUE DATE / TIME: 202202221022
Unit Type and Rh: 5100

## 2020-12-10 MED ORDER — PROMETHAZINE HCL 25 MG/ML IJ SOLN
6.2500 mg | Freq: Four times a day (QID) | INTRAMUSCULAR | Status: DC | PRN
  Administered 2020-12-10 – 2020-12-11 (×2): 6.25 mg via INTRAVENOUS
  Filled 2020-12-10 (×3): qty 1

## 2020-12-10 MED ORDER — OXYCODONE-ACETAMINOPHEN 5-325 MG PO TABS
1.0000 | ORAL_TABLET | ORAL | Status: DC | PRN
  Administered 2020-12-10 – 2020-12-11 (×2): 1 via ORAL
  Filled 2020-12-10 (×3): qty 1

## 2020-12-10 MED ORDER — OXYCODONE HCL 5 MG PO TABS
5.0000 mg | ORAL_TABLET | ORAL | Status: DC | PRN
Start: 2020-12-10 — End: 2020-12-11
  Administered 2020-12-10 – 2020-12-11 (×2): 5 mg via ORAL
  Filled 2020-12-10 (×4): qty 1

## 2020-12-10 NOTE — Progress Notes (Signed)
Subjective: Krista [name] is a 35 year old female with a medical history significant for sickle cell disease was admitted for sickle cell pain crisis. Patient states that pain intensity has improved overnight.  She rates pain as 4/10 primarily to chest and lower extremities.  She denies any headache, dizziness, shortness of breath, urinary symptoms, nausea, vomiting, or diarrhea.  Objective:  Vital signs in last 24 hours:  Vitals:   12/10/20 0532 12/10/20 1036 12/10/20 1119 12/10/20 1400  BP: (!) 133/96 126/89  131/88  Pulse: 79 (!) 59  (!) 57  Resp: 14 15 15 16   Temp: (!) 97.3 F (36.3 C) 98.5 F (36.9 C)  97.9 F (36.6 C)  TempSrc: Oral Oral  Oral  SpO2: 96% 97% 100% 97%  Weight:      Height:        Intake/Output from previous day:   Intake/Output Summary (Last 24 hours) at 12/10/2020 1659 Last data filed at 12/10/2020 0954 Gross per 24 hour  Intake 1502.36 ml  Output -  Net 1502.36 ml    Physical Exam: General: Alert, awake, oriented x3, in no acute distress.  HEENT: /AT PEERL, EOMI Neck: Trachea midline,  no masses, no thyromegal,y no JVD, no carotid bruit OROPHARYNX:  Moist, No exudate/ erythema/lesions.  Heart: Regular rate and rhythm, without murmurs, rubs, gallops, PMI non-displaced, no heaves or thrills on palpation.  Lungs: Clear to auscultation, no wheezing or rhonchi noted. No increased vocal fremitus resonant to percussion  Abdomen: Soft, nontender, nondistended, positive bowel sounds, no masses no hepatosplenomegaly noted..  Neuro: No focal neurological deficits noted cranial nerves II through XII grossly intact. DTRs 2+ bilaterally upper and lower extremities. Strength 5 out of 5 in bilateral upper and lower extremities. Musculoskeletal: No warm swelling or erythema around joints, no spinal tenderness noted. Psychiatric: Patient alert and oriented x3, good insight and cognition, good recent to remote recall. Lymph node survey: No cervical axillary or  inguinal lymphadenopathy noted.  Lab Results:  Basic Metabolic Panel:    Component Value Date/Time   NA 141 12/08/2020 2319   NA 143 11/11/2020 1027   K 3.5 12/08/2020 2319   CL 107 12/08/2020 2319   CO2 25 12/08/2020 2319   BUN 8 12/08/2020 2319   BUN 6 11/11/2020 1027   CREATININE 0.43 (L) 12/08/2020 2319   CREATININE 0.51 03/24/2017 1501   GLUCOSE 85 12/08/2020 2319   CALCIUM 8.7 (L) 12/08/2020 2319   CBC:    Component Value Date/Time   WBC 12.9 (H) 12/10/2020 0542   HGB 7.7 (L) 12/10/2020 0542   HGB 8.0 (L) 11/11/2020 1027   HCT 23.8 (L) 12/10/2020 0542   HCT 24.1 (L) 11/11/2020 1027   PLT 713 (H) 12/10/2020 0542   PLT 641 (H) 11/11/2020 1027   MCV 93.0 12/10/2020 0542   MCV 93 11/11/2020 1027   NEUTROABS 6.6 12/10/2020 0542   NEUTROABS 6.1 11/11/2020 1027   LYMPHSABS 4.6 (H) 12/10/2020 0542   LYMPHSABS 3.4 (H) 11/11/2020 1027   MONOABS 1.1 (H) 12/10/2020 0542   EOSABS 0.3 12/10/2020 0542   EOSABS 0.1 11/11/2020 1027   BASOSABS 0.1 12/10/2020 0542   BASOSABS 0.1 11/11/2020 1027    Recent Results (from the past 240 hour(s))  Resp Panel by RT-PCR (Flu A&B, Covid) Nasopharyngeal Swab     Status: None   Collection Time: 12/09/20  1:25 AM   Specimen: Nasopharyngeal Swab; Nasopharyngeal(NP) swabs in vial transport medium  Result Value Ref Range Status   SARS Coronavirus 2  by RT PCR NEGATIVE NEGATIVE Final    Comment: (NOTE) SARS-CoV-2 target nucleic acids are NOT DETECTED.  The SARS-CoV-2 RNA is generally detectable in upper respiratory specimens during the acute phase of infection. The lowest concentration of SARS-CoV-2 viral copies this assay can detect is 138 copies/mL. A negative result does not preclude SARS-Cov-2 infection and should not be used as the sole basis for treatment or other patient management decisions. A negative result may occur with  improper specimen collection/handling, submission of specimen other than nasopharyngeal swab, presence of  viral mutation(s) within the areas targeted by this assay, and inadequate number of viral copies(<138 copies/mL). A negative result must be combined with clinical observations, patient history, and epidemiological information. The expected result is Negative.  Fact Sheet for Patients:  EntrepreneurPulse.com.au  Fact Sheet for Healthcare Providers:  IncredibleEmployment.be  This test is no t yet approved or cleared by the [place] FDA and  has been authorized for detection and/or diagnosis of SARS-CoV-2 by FDA under an Emergency Use Authorization (EUA). This EUA will remain  in effect (meaning this test can be used) for the duration of the COVID-19 declaration under Section 564(b)(1) of the Act, 21 U.S.C.section 360bbb-3(b)(1), unless the authorization is terminated  or revoked sooner.       Influenza A by PCR NEGATIVE NEGATIVE Final   Influenza B by PCR NEGATIVE NEGATIVE Final    Comment: (NOTE) The Xpert Xpress SARS-CoV-2/FLU/RSV plus assay is intended as an aid in the diagnosis of influenza from Nasopharyngeal swab specimens and should not be used as a sole basis for treatment. Nasal washings and aspirates are unacceptable for Xpert Xpress SARS-CoV-2/FLU/RSV testing.  Fact Sheet for Patients: EntrepreneurPulse.com.au  Fact Sheet for Healthcare Providers: IncredibleEmployment.be  This test is not yet approved or cleared by the [place] FDA and has been authorized for detection and/or diagnosis of SARS-CoV-2 by FDA under an Emergency Use Authorization (EUA). This EUA will remain in effect (meaning this test can be used) for the duration of the COVID-19 declaration under Section 564(b)(1) of the Act, 21 U.S.C. section 360bbb-3(b)(1), unless the authorization is terminated or revoked.  Performed at [facility], [place] [address], [place], [place] [address]      Studies/Results: DG Chest 2 View  Result Date: 12/09/2020 CLINICAL DATA:  Chest pain, history of sickle cell EXAM: CHEST - 2 VIEW COMPARISON:  Radiograph 11/11/2020 FINDINGS: No consolidation, features of edema, pneumothorax, or effusion. Pulmonary vascularity is normally distributed. The cardiomediastinal contours are unremarkable. No acute osseous or soft tissue abnormality. Cholecystectomy clips in the right upper quadrant. Telemetry leads overlie the chest. IMPRESSION: No acute cardiopulmonary abnormality. Electronically Signed   By: Lovena Le M.D.   On: 12/09/2020 00:13    Medications: Scheduled Meds: . folic acid  1 mg Oral Daily  . ketorolac  15 mg Intravenous Q6H  . senna-docusate  1 tablet Oral BID   Continuous Infusions: . sodium chloride 10 mL/hr at 12/10/20 1317   PRN Meds:.oxyCODONE-acetaminophen **AND** oxyCODONE, polyethylene glycol, promethazine  Consultants:  None  Procedures:  None  Antibiotics:  None  Assessment/Plan: Principal Problem:   Sickle cell pain crisis ([place]) Active Problems:   Thrombocytosis   Chest pain   Sickle cell anemia (HCC)   Leukocytosis  Sickle cell pain crisis: Discontinue IV Dilaudid PCA Decrease IV fluids to KVO Initiate Percocet 10-325 mg every 4 hours as needed for severe breakthrough pain Continue Toradol 15 mg IV every 6 hours for total of 5 days  Monitor vital signs very closely, reevaluate pain scale regularly, and supplemental oxygen as needed.  Sickle cell anemia: Today, hemoglobin is 7.7.  Patient is s/p 1 unit PRBCs.  No further transfusions warranted at this time.  Continue to follow closely.  CBC in AM.  Chest pain: Improving.  No further cardiac monitoring warranted.  Chest x-ray shows no acute cardiopulmonary process.  High-sensitivity troponin negative.  Leukocytosis: Stable.  Likely reactive.  No infectious signs or symptoms.  Continue to follow closely without antibiotics.  CBC in a.m.  Code  Status: Full Code Family Communication: N/A Disposition Plan: Not yet ready for discharge.  Probable discharge in a.m.  [name]  APRN, MSN, FNP-C Patient [facility] [address] [place], [place] [address] [phone]  If 5PM-8AM, please contact night-coverage.  12/10/2020, 4:59 PM  LOS: 1 day

## 2020-12-11 ENCOUNTER — Encounter: Payer: Self-pay | Admitting: Family Medicine

## 2020-12-11 MED ORDER — PROMETHAZINE HCL 12.5 MG PO TABS: 12.5000 mg | ORAL_TABLET | Freq: Four times a day (QID) | ORAL | 0 refills | Status: DC | PRN

## 2020-12-11 NOTE — Discharge Summary (Signed)
Physician Discharge Summary  Nike Southers WNI:[id number] DOB: 1986-09-20 DOA: 12/08/2020  PCP: Dorena Dew, FNP  Admit date: 12/08/2020  Discharge date: 12/12/2020  Discharge Diagnoses:  Principal Problem:   Sickle cell pain crisis [facility]) Active Problems:   Thrombocytosis   Chest pain   Sickle cell anemia (HCC)   Leukocytosis   Discharge Condition: Stable  Disposition:   Follow-up Information    Dorena Dew, FNP Follow up in 3 week(s).   Specialty: Family Medicine Contact information: [place]. [place] [address] [phone]              Pt is discharged home in good condition and is to follow up with Dorena Dew, FNP this week to have labs evaluated. Britania Shreeve is instructed to increase activity slowly and balance with rest for the next few days, and use prescribed medication to complete treatment of pain  Diet: Regular Wt Readings from Last 3 Encounters:  12/09/20 69.5 kg  12/07/20 69.5 kg  11/11/20 71.6 kg    History of present illness:  Shya Kovatch is a 35 year old female with a medical history significant for sickle cell anemia, chronic pain syndrome presenting to the ED for evaluation of sickle cell pain.  Patient states that she did some home video exercises 2 nights ago and soon after start experiencing joint pain in both of her knees, hips, and shoulders.  The pain continued to get worse and she came to the emergency room the following day, where she was treated and discharged.  After going home, she was continuing to have pain despite taking ibuprofen 800 every 8 hours and oxycodone 5 mg every 4 hours.  She feels the pain is similar to her prior sickle cell pain crisis episodes.  She does endorse some mild substernal chest pain which started last night.  Patient states that she had Covid infection at the beginning of this month.  She was having cough, congestion, and diarrhea at that time and had a positive home Covid  test.  She feels that she is now fully recovered and no longer endorsing any symptoms of acute viral illness.  ER course: Vital signs stable.  WBC 16.2, hemoglobin 6.7 (baseline 7-8), platelet count 750 (at baseline).  Sodium 141, potassium 3.5, chloride 107, bicarb 25, BUN 8, creatinine 0.4, glucose 85.  Absolute reticulocyte count elevated.  Beta hCG negative.  SARS COVID-19 2 PCR test negative UA without signs of infection.  Chest x-ray shows no acute cardiopulmonary abnormalities.  1 unit of PRBCs ordered.  She was given doses of Dilaudid, but continues to endorse pain.  Admission requested for sickle cell pain crisis.  Hospital Course:  Sickle cell disease with pain crisis: Patient was admitted for sickle cell pain crisis and managed appropriately with IVF, IV Dilaudid via PCA and IV Toradol, as well as other adjunct therapies per sickle cell pain management protocols.  IV Dilaudid PCA was weaned appropriately.  Patient transition to home medication of oxycodone 5 mg every 6 hours as needed.  Also, patient advised to resume ibuprofen as previously prescribed.  Continue to hydrate with around 32-64 ounces of water per day.  Refrain from strenuous home exercise regimen.  Follow-up with PCP in clinic in 2 weeks for CBC with differential and CMP.   Sickle cell anemia: Hemoglobin was 6.7, which was below patient's baseline on admission.  She was transfused 1 unit PRBCs.  Hemoglobin is 7.7 prior to discharge.  Patient will continue folic  acid 1 mg daily.  She is not on any other disease modifying agents at this time.  Patient was therefore discharged home today in a hemodynamically stable condition.   Mally will follow-up with PCP within 1 week of this discharge. Virgina was counseled extensively about nonpharmacologic means of pain management, patient verbalized understanding and was appreciative of  the care received during this admission.   We discussed the need for good hydration, monitoring of  hydration status, avoidance of heat, cold, stress, and infection triggers. We discussed the need to be adherent with home medications. Patient was reminded of the need to seek medical attention immediately if any symptom of bleeding, anemia, or infection occurs.  Discharge Exam: Vitals:   12/11/20 0549 12/11/20 1009  BP: 109/67 (!) 134/91  Pulse: (!) 55 72  Resp: 14 16  Temp: 97.6 F (36.4 C) 98.3 F (36.8 C)  SpO2: 96% 98%   Vitals:   12/10/20 2033 12/11/20 0006 12/11/20 0549 12/11/20 1009  BP: 110/68 130/86 109/67 (!) 134/91  Pulse: 64 60 (!) 55 72  Resp: 14 14 14 16   Temp: 98 F (36.7 C) 97.9 F (36.6 C) 97.6 F (36.4 C) 98.3 F (36.8 C)  TempSrc: Oral Oral Oral Oral  SpO2: 97% 97% 96% 98%  Weight:      Height:        General appearance : Awake, alert, not in any distress. Speech Clear. Not toxic looking HEENT: Atraumatic and Normocephalic, pupils equally reactive to light and accomodation Neck: Supple, no JVD. No cervical lymphadenopathy.  Chest: Good air entry bilaterally, no added sounds  CVS: S1 S2 regular, no murmurs.  Abdomen: Bowel sounds present, Non tender and not distended with no gaurding, rigidity or rebound. Extremities: B/L Lower Ext shows no edema, both legs are warm to touch Neurology: Awake alert, and oriented X 3, CN II-XII intact, Non focal Skin: No Rash  Discharge Instructions  Discharge Instructions    Discharge patient   Complete by: As directed    Discharge disposition: 01-Home or Self Care   Discharge patient date: 12/11/2020     Allergies as of 12/11/2020      Reactions   Keflex [cephalexin] Swelling   Face swelling      Medication List    TAKE these medications   aspirin EC 81 MG tablet Take 1 tablet (81 mg total) by mouth daily.   cetirizine 10 MG tablet Commonly known as: ZYRTEC Take 10 mg by mouth daily as needed for allergies.   dorzolamide-timolol 22.3-6.8 MG/ML ophthalmic solution Commonly known as: COSOPT Place 1 drop  into the left eye 2 (two) times daily.   ergocalciferol 1.25 MG (50000 UT) capsule Commonly known as: VITAMIN D2 Take 1 capsule (50,000 Units total) by mouth once a week. What changed: when to take this   folic acid 1 MG tablet Commonly known as: FOLVITE Take 1 tablet (1 mg total) by mouth daily.   ibuprofen 800 MG tablet Commonly known as: ADVIL Take 1 tablet (800 mg total) by mouth every 8 (eight) hours as needed. What changed: reasons to take this   mometasone 0.1 % cream Commonly known as: ELOCON Apply 1 application topically as needed. What changed: reasons to take this   norethindrone 0.35 MG tablet Commonly known as: MICRONOR Take 1 tablet by mouth daily.   oxycodone 5 MG capsule Commonly known as: OXY-IR Take 1 capsule (5 mg total) by mouth every 4 (four) hours as needed. What changed: reasons to take  this   prednisoLONE acetate 1 % ophthalmic suspension Commonly known as: PRED FORTE Place 1 drop into the left eye 4 (four) times daily.   prenatal multivitamin Tabs tablet Take 1 tablet by mouth daily at 12 noon.   Prolensa 0.07 % Soln Generic drug: Bromfenac Sodium Place 1 drop into the left eye 4 (four) times daily.   promethazine 12.5 MG tablet Commonly known as: PHENERGAN Take 1 tablet (12.5 mg total) by mouth every 6 (six) hours as needed for nausea or vomiting.       The results of significant diagnostics from this hospitalization (including imaging, microbiology, ancillary and laboratory) are listed below for reference.    Significant Diagnostic Studies: DG Chest 2 View  Result Date: 12/09/2020 CLINICAL DATA:  Chest pain, history of sickle cell EXAM: CHEST - 2 VIEW COMPARISON:  Radiograph 11/11/2020 FINDINGS: No consolidation, features of edema, pneumothorax, or effusion. Pulmonary vascularity is normally distributed. The cardiomediastinal contours are unremarkable. No acute osseous or soft tissue abnormality. Cholecystectomy clips in the right upper  quadrant. Telemetry leads overlie the chest. IMPRESSION: No acute cardiopulmonary abnormality. Electronically Signed   By: Lovena Le M.D.   On: 12/09/2020 00:13    Microbiology: Recent Results (from the past 240 hour(s))  Resp Panel by RT-PCR (Flu A&B, Covid) Nasopharyngeal Swab     Status: None   Collection Time: 12/09/20  1:25 AM   Specimen: Nasopharyngeal Swab; Nasopharyngeal(NP) swabs in vial transport medium  Result Value Ref Range Status   SARS Coronavirus 2 by RT PCR NEGATIVE NEGATIVE Final    Comment: (NOTE) SARS-CoV-2 target nucleic acids are NOT DETECTED.  The SARS-CoV-2 RNA is generally detectable in upper respiratory specimens during the acute phase of infection. The lowest concentration of SARS-CoV-2 viral copies this assay can detect is 138 copies/mL. A negative result does not preclude SARS-Cov-2 infection and should not be used as the sole basis for treatment or other patient management decisions. A negative result may occur with  improper specimen collection/handling, submission of specimen other than nasopharyngeal swab, presence of viral mutation(s) within the areas targeted by this assay, and inadequate number of viral copies(<138 copies/mL). A negative result must be combined with clinical observations, patient history, and epidemiological information. The expected result is Negative.  Fact Sheet for Patients:  EntrepreneurPulse.com.au  Fact Sheet for Healthcare Providers:  IncredibleEmployment.be  This test is no t yet approved or cleared by the [place] FDA and  has been authorized for detection and/or diagnosis of SARS-CoV-2 by FDA under an Emergency Use Authorization (EUA). This EUA will remain  in effect (meaning this test can be used) for the duration of the COVID-19 declaration under Section 564(b)(1) of the Act, 21 U.S.C.section 360bbb-3(b)(1), unless the authorization is terminated  or revoked sooner.        Influenza A by PCR NEGATIVE NEGATIVE Final   Influenza B by PCR NEGATIVE NEGATIVE Final    Comment: (NOTE) The Xpert Xpress SARS-CoV-2/FLU/RSV plus assay is intended as an aid in the diagnosis of influenza from Nasopharyngeal swab specimens and should not be used as a sole basis for treatment. Nasal washings and aspirates are unacceptable for Xpert Xpress SARS-CoV-2/FLU/RSV testing.  Fact Sheet for Patients: EntrepreneurPulse.com.au  Fact Sheet for Healthcare Providers: IncredibleEmployment.be  This test is not yet approved or cleared by the [place] FDA and has been authorized for detection and/or diagnosis of SARS-CoV-2 by FDA under an Emergency Use Authorization (EUA). This EUA will remain in effect (meaning this test can  be used) for the duration of the COVID-19 declaration under Section 564(b)(1) of the Act, 21 U.S.C. section 360bbb-3(b)(1), unless the authorization is terminated or revoked.  Performed at [facility], [place] [address] [place], [place] [address]      Labs: Basic Metabolic Panel: Recent Labs  Lab 12/07/20 0712 12/08/20 2319  NA 143 141  K 3.7 3.5  CL 113* 107  CO2 21* 25  GLUCOSE 85 85  BUN 6 8  CREATININE 0.54 0.43*  CALCIUM 9.0 8.7*   Liver Function Tests: Recent Labs  Lab 12/07/20 0712 12/08/20 2319  AST 20 16  ALT 13 14  ALKPHOS 66 61  BILITOT 3.2* 3.5*  PROT 7.5 7.2  ALBUMIN 4.5 4.4   No results for input(s): LIPASE, AMYLASE in the last 168 hours. No results for input(s): AMMONIA in the last 168 hours. CBC: Recent Labs  Lab 12/07/20 0712 12/08/20 2319 12/09/20 1200 12/10/20 0542  WBC 21.0* 16.2* 15.8* 12.9*  NEUTROABS 15.7* 9.1*  --  6.6  HGB 7.5* 6.7* 7.0* 7.7*  HCT 21.5* 20.0* 21.0* 23.8*  MCV 90.3 93.9 92.5 93.0  PLT 810* 750* 674* 713*   Cardiac Enzymes: Recent Labs  Lab 12/09/20 1200  CKTOTAL 25*   BNP: Invalid input(s): POCBNP CBG: No  results for input(s): GLUCAP in the last 168 hours.  Time coordinating discharge: 35 minutes  Signed:  [name]  APRN, MSN, FNP-C Patient [facility] [address] [place], [place] [address] [phone]  [facility] 12/12/2020, 4:36 PM

## 2020-12-11 NOTE — Discharge Instructions (Signed)
Sickle Cell Anemia, Adult  Sickle cell anemia is a condition where your red blood cells are shaped like sickles. Red blood cells carry oxygen through the body. Sickle-shaped cells do not live as long as normal red blood cells. They also clump together and block blood from flowing through the blood vessels. This prevents the body from getting enough oxygen. Sickle cell anemia causes organ damage and pain. It also increases the risk of infection. Follow these instructions at home: Medicines  Take over-the-counter and prescription medicines only as told by your doctor.  If you were prescribed an antibiotic medicine, take it as told by your doctor. Do not stop taking the antibiotic even if you start to feel better.  If you develop a fever, do not take medicines to lower the fever right away. Tell your doctor about the fever. Managing pain, stiffness, and swelling  Try these methods to help with pain: ? Use a heating pad. ? Take a warm bath. ? Distract yourself, such as by watching TV. Eating and drinking  Drink enough fluid to keep your pee (urine) clear or pale yellow. Drink more in hot weather and during exercise.  Limit or avoid alcohol.  Eat a healthy diet. Eat plenty of fruits, vegetables, whole grains, and lean protein.  Take vitamins and supplements as told by your doctor. Traveling  When traveling, keep these with you: ? Your medical information. ? The names of your doctors. ? Your medicines.  If you need to take an airplane, talk to your doctor first. Activity  Rest often.  Avoid exercises that make your heart beat much faster, such as jogging. General instructions  Do not use products that have nicotine or tobacco, such as cigarettes and e-cigarettes. If you need help quitting, ask your doctor.  Consider wearing a medical alert bracelet.  Avoid being in high places (high altitudes), such as mountains.  Avoid very hot or cold temperatures.  Avoid places where the  temperature changes a lot.  Keep all follow-up visits as told by your doctor. This is important. Contact a doctor if:  A joint hurts.  Your feet or hands hurt or swell.  You feel tired (fatigued). Get help right away if:  You have symptoms of infection. These include: ? Fever. ? Chills. ? Being very tired. ? Irritability. ? Poor eating. ? Throwing up (vomiting).  You feel dizzy or faint.  You have new stomach pain, especially on the left side.  You have a an erection (priapism) that lasts more than 4 hours.  You have numbness in your arms or legs.  You have a hard time moving your arms or legs.  You have trouble talking.  You have pain that does not go away when you take medicine.  You are short of breath.  You are breathing fast.  You have a long-term cough.  You have pain in your chest.  You have a bad headache.  You have a stiff neck.  Your stomach looks bloated even though you did not eat much.  Your skin is pale.  You suddenly cannot see well. Summary  Sickle cell anemia is a condition where your red blood cells are shaped like sickles.  Follow your doctor's advice on ways to manage pain, food to eat, activities to do, and steps to take for safe travel.  Get medical help right away if you have any signs of infection, such as a fever. This information is not intended to replace advice given to you by   your health care provider. Make sure you discuss any questions you have with your health care provider. Document Revised: 02/28/2020 Document Reviewed: 02/28/2020 Elsevier Patient Education  2021 [employer]  

## 2021-01-06 ENCOUNTER — Encounter: Payer: Self-pay | Admitting: Family Medicine

## 2021-01-06 ENCOUNTER — Other Ambulatory Visit: Payer: Self-pay

## 2021-01-06 ENCOUNTER — Ambulatory Visit (INDEPENDENT_AMBULATORY_CARE_PROVIDER_SITE_OTHER): Payer: BC Managed Care – PPO | Admitting: Family Medicine

## 2021-01-06 VITALS — BP 111/67 | HR 70 | Temp 98.6°F | Ht 67.0 in | Wt 158.6 lb

## 2021-01-06 DIAGNOSIS — D571 Sickle-cell disease without crisis: Secondary | ICD-10-CM

## 2021-01-06 DIAGNOSIS — E559 Vitamin D deficiency, unspecified: Secondary | ICD-10-CM | POA: Diagnosis not present

## 2021-01-06 DIAGNOSIS — Z09 Encounter for follow-up examination after completed treatment for conditions other than malignant neoplasm: Secondary | ICD-10-CM

## 2021-01-06 DIAGNOSIS — Z8701 Personal history of pneumonia (recurrent): Secondary | ICD-10-CM | POA: Insufficient documentation

## 2021-01-06 LAB — POCT URINALYSIS DIPSTICK
Bilirubin, UA: NEGATIVE
Blood, UA: NEGATIVE
Glucose, UA: NEGATIVE
Ketones, UA: NEGATIVE
Nitrite, UA: NEGATIVE
Protein, UA: NEGATIVE
Spec Grav, UA: 1.015 (ref 1.010–1.025)
Urobilinogen, UA: 1 E.U./dL
pH, UA: 6 (ref 5.0–8.0)

## 2021-01-06 NOTE — Progress Notes (Signed)
Patient [name]    Subjective:  Patient ID: Krista [name], female    DOB: 1985/12/12  Age: 35 y.o. MRN: [id number]  CC:  Chief Complaint  Patient presents with  . Hospitalization Follow-up    Follow up  having pain , pain is  all over , mainly in knees and hips pain 6 , patient is taking ibp 800mg   not helping with pain     HPI  Krista [name] is a 35 year old female with a medical history significant for sickle cell disease, vitamin D deficiency, and anemia of chronic disease presents for post hospital follow-up.  Patient was admitted to [facility] long hospital on 12/09/2020 for sickle cell pain crisis.  During hospital admission, patient was found to have a hemoglobin below baseline at 6.7.  She was transfused 1 unit of PRBCs.  Patient's hemoglobin prior to discharge was 7.7 g/dL.  Today, patient is complaining of pain primarily to low back and lower extremities.  She attributes current pain crisis to starting her menstrual cycle.  Patient says that she had ibuprofen this a.m. without sustained relief.  She denies any headache, chest pain, shortness of breath, urinary symptoms, nausea, vomiting, or diarrhea.  Past Medical History:  Diagnosis Date  . Chronic pain    with sickle cell   . Eczema   . Heart murmur    07-13-2019  per pt since childhood,  has not ever had a echo done ,  asymptomatic  . History of blood transfusion    x3   . Missed ab   . Sickle cell anemia ([place])    followed by pcp---  last crisis 12-14-2018  . Vitamin D deficiency   . Wears glasses     Past Surgical History:  Procedure Laterality Date  . EYE SURGERY Left 11/12/2019  . LAPAROSCOPIC CHOLECYSTECTOMY  1997    Family History  Problem Relation Age of Onset  . Diabetes Father   . Hypertension Father   . Heart disease Brother   . Cancer Maternal Grandmother   . Heart disease Brother   . Breast cancer Paternal Aunt   . Cancer Paternal Uncle     Social  History   Socioeconomic History  . Marital status: Married    Spouse name: n/a  . Number of children: 0  . Years of education: 16+  . Highest education level: Not on file  Occupational History  . Occupation: Product manager: [employer]  Tobacco Use  . Smoking status: Never Smoker  . Smokeless tobacco: Never Used  Vaping Use  . Vaping Use: Never used  Substance and Sexual Activity  . Alcohol use: Not Currently    Comment: socially  . Drug use: Never  . Sexual activity: Yes    Birth control/protection: None  Other Topics Concern  . Not on file  Social History Narrative   ** Merged History Encounter **       Lives alone.  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   Social Determinants of Health   Financial Resource Strain: Not on file  Food Insecurity: Not on file  Transportation Needs: Not on file  Physical Activity: Not on file  Stress: Not on file  Social Connections: Not on file  Intimate Partner Violence: Not on file    Outpatient Medications Prior to Visit  Medication Sig Dispense Refill  . aspirin EC 81  MG tablet Take 1 tablet (81 mg total) by mouth daily. 30 tablet 5  . cetirizine (ZYRTEC) 10 MG tablet Take 10 mg by mouth daily as needed for allergies.    Marland Kitchen ergocalciferol (VITAMIN D2) 1.25 MG (50000 UT) capsule Take 1 capsule (50,000 Units total) by mouth once a week. (Patient taking differently: Take 50,000 Units by mouth every Monday.) 12 capsule 1  . folic acid (FOLVITE) 1 MG tablet Take 1 tablet (1 mg total) by mouth daily. 90 tablet 3  . ibuprofen (ADVIL) 800 MG tablet Take 1 tablet (800 mg total) by mouth every 8 (eight) hours as needed. (Patient taking differently: Take 800 mg by mouth every 8 (eight) hours as needed for moderate pain.) 60 tablet 1  . oxycodone (OXY-IR) 5 MG capsule Take 1 capsule (5 mg total) by mouth every 4 (four) hours as needed. (Patient taking  differently: Take 5 mg by mouth every 4 (four) hours as needed for pain.) 30 capsule 0  . prednisoLONE acetate (PRED FORTE) 1 % ophthalmic suspension Place 1 drop into the left eye 4 (four) times daily. 15 mL 5  . Prenatal Vit-Fe Fumarate-FA (PRENATAL MULTIVITAMIN) TABS tablet Take 1 tablet by mouth daily at 12 noon.    . Bromfenac Sodium (PROLENSA) 0.07 % SOLN Place 1 drop into the left eye 4 (four) times daily. 6 mL 3  . dorzolamide-timolol (COSOPT) 22.3-6.8 MG/ML ophthalmic solution Place 1 drop into the left eye 2 (two) times daily. 10 mL 5  . mometasone (ELOCON) 0.1 % cream Apply 1 application topically as needed. (Patient not taking: Reported on 01/06/2021) 45 g 2  . norethindrone (MICRONOR) 0.35 MG tablet Take 1 tablet by mouth daily.    . promethazine (PHENERGAN) 12.5 MG tablet Take 1 tablet (12.5 mg total) by mouth every 6 (six) hours as needed for nausea or vomiting. (Patient not taking: Reported on 01/06/2021) 30 tablet 0   No facility-administered medications prior to visit.    Allergies  Allergen Reactions  . Keflex [Cephalexin] Swelling    Face swelling    ROS Review of Systems  Constitutional: Negative for activity change and appetite change.  HENT: Negative.   Eyes: Negative.   Respiratory: Negative.   Cardiovascular: Negative.   Gastrointestinal: Negative.   Genitourinary: Negative.   Musculoskeletal: Positive for back pain.  Neurological: Negative.   Hematological: Negative.   Psychiatric/Behavioral: Negative.       Objective:    Physical Exam Constitutional:      Appearance: Normal appearance.  Eyes:     Pupils: Pupils are equal, round, and reactive to light.  Cardiovascular:     Rate and Rhythm: Normal rate and regular rhythm.     Pulses: Normal pulses.  Pulmonary:     Effort: Pulmonary effort is normal.  Abdominal:     General: Bowel sounds are normal.  Neurological:     General: No focal deficit present.     Mental Status: She is alert. Mental  status is at baseline.  Psychiatric:        Mood and Affect: Mood normal.        Thought Content: Thought content normal.        Judgment: Judgment normal.     BP 111/67 (BP Location: Left Arm, Patient Position: Sitting, Cuff Size: Normal)   Pulse 70   Temp 98.6 F (37 C) (Temporal)   Ht 5\' 7"  (1.702 m)   Wt 158 lb 9.6 oz (71.9 kg)   LMP 12/30/2020  SpO2 98%   BMI 24.84 kg/m  Wt Readings from Last 3 Encounters:  01/06/21 158 lb 9.6 oz (71.9 kg)  12/09/20 153 lb 2 oz (69.5 kg)  12/07/20 153 lb 2 oz (69.5 kg)     Health Maintenance Due  Topic Date Due  . PAP SMEAR-Modifier  01/15/2021    There are no preventive care reminders to display for this patient.  Lab Results  Component Value Date   TSH 2.21 03/24/2017   Lab Results  Component Value Date   WBC 12.9 (H) 12/10/2020   HGB 7.7 (L) 12/10/2020   HCT 23.8 (L) 12/10/2020   MCV 93.0 12/10/2020   PLT 713 (H) 12/10/2020   Lab Results  Component Value Date   NA 141 12/08/2020   K 3.5 12/08/2020   CO2 25 12/08/2020   GLUCOSE 85 12/08/2020   BUN 8 12/08/2020   CREATININE 0.43 (L) 12/08/2020   BILITOT 3.5 (H) 12/08/2020   ALKPHOS 61 12/08/2020   AST 16 12/08/2020   ALT 14 12/08/2020   PROT 7.2 12/08/2020   ALBUMIN 4.4 12/08/2020   CALCIUM 8.7 (L) 12/08/2020   ANIONGAP 9 12/08/2020   Lab Results  Component Value Date   CHOL 112 (L) 08/12/2015   Lab Results  Component Value Date   HDL 26 (L) 08/12/2015   Lab Results  Component Value Date   LDLCALC 67 08/12/2015   Lab Results  Component Value Date   TRIG 95 08/12/2015   Lab Results  Component Value Date   CHOLHDL 4.3 08/12/2015   Lab Results  Component Value Date   HGBA1C <4.0 03/24/2017      Assessment & Plan:   Problem List Items Addressed This Visit      Other   Hb-SS disease without crisis ([place]) - Primary   Relevant Orders   Sickle Cell Panel     Hb-SS disease without crisis ([place]) Patient is having pain that is consistent with  previous sickle cell pain crisis.  She is advised to hydrate with 64 ounces of water, take oxycodone as prescribed, ibuprofen 800 every 8 hours with food. If pain persists, patient advised to notify sickle cell day infusion clinic in a.m. for day admission.  Patient declines admission on today. - Sickle Cell Panel - POCT Urinalysis Dipstick  2. Vitamin D deficiency -Sickle cell panel  3. Hospital discharge follow-up Patient was discharged on 12/11/2020 and sickle cell pain crisis.  Follow-up: Return in about 3 months (around 04/08/2021) for sickle cell anemia.   [name]  APRN, MSN, FNP-C [facility] [address] [place], [place] [address] [phone]

## 2021-01-06 NOTE — Patient Instructions (Signed)
Discussed the importance of drinking 64 ounces of water daily. The Importance of Water. To help prevent pain crises, it is important to drink plenty of water throughout the day. This is because dehydration of red blood cells may lead to the sickling process.      Sickle Cell Anemia, Adult  Sickle cell anemia is a condition where your red blood cells are shaped like sickles. Red blood cells carry oxygen through the body. Sickle-shaped cells do not live as long as normal red blood cells. They also clump together and block blood from flowing through the blood vessels. This prevents the body from getting enough oxygen. Sickle cell anemia causes organ damage and pain. It also increases the risk of infection. Follow these instructions at home: Medicines  Take over-the-counter and prescription medicines only as told by your doctor.  If you were prescribed an antibiotic medicine, take it as told by your doctor. Do not stop taking the antibiotic even if you start to feel better.  If you develop a fever, do not take medicines to lower the fever right away. Tell your doctor about the fever. Managing pain, stiffness, and swelling  Try these methods to help with pain: ? Use a heating pad. ? Take a warm bath. ? Distract yourself, such as by watching TV. Eating and drinking  Drink enough fluid to keep your pee (urine) clear or pale yellow. Drink more in hot weather and during exercise.  Limit or avoid alcohol.  Eat a healthy diet. Eat plenty of fruits, vegetables, whole grains, and lean protein.  Take vitamins and supplements as told by your doctor. Traveling  When traveling, keep these with you: ? Your medical information. ? The names of your doctors. ? Your medicines.  If you need to take an airplane, talk to your doctor first. Activity  Rest often.  Avoid exercises that make your heart beat much faster, such as jogging. General instructions  Do not use products that have  nicotine or tobacco, such as cigarettes and e-cigarettes. If you need help quitting, ask your doctor.  Consider wearing a medical alert bracelet.  Avoid being in high places (high altitudes), such as mountains.  Avoid very hot or cold temperatures.  Avoid places where the temperature changes a lot.  Keep all follow-up visits as told by your doctor. This is important. Contact a doctor if:  A joint hurts.  Your feet or hands hurt or swell.  You feel tired (fatigued). Get help right away if:  You have symptoms of infection. These include: ? Fever. ? Chills. ? Being very tired. ? Irritability. ? Poor eating. ? Throwing up (vomiting).  You feel dizzy or faint.  You have new stomach pain, especially on the left side.  You have a an erection (priapism) that lasts more than 4 hours.  You have numbness in your arms or legs.  You have a hard time moving your arms or legs.  You have trouble talking.  You have pain that does not go away when you take medicine.  You are short of breath.  You are breathing fast.  You have a long-term cough.  You have pain in your chest.  You have a bad headache.  You have a stiff neck.  Your stomach looks bloated even though you did not eat much.  Your skin is pale.  You suddenly cannot see well. Summary  Sickle cell anemia is a condition where your red blood cells are shaped like sickles.  Follow  your doctor's advice on ways to manage pain, food to eat, activities to do, and steps to take for safe travel.  Get medical help right away if you have any signs of infection, such as a fever. This information is not intended to replace advice given to you by your health care provider. Make sure you discuss any questions you have with your health care provider. Document Revised: 02/28/2020 Document Reviewed: 02/28/2020 Elsevier Patient Education  [place].

## 2021-01-07 LAB — CMP14+CBC/D/PLT+FER+RETIC+V...
ALT: 11 IU/L (ref 0–32)
AST: 18 IU/L (ref 0–40)
Albumin/Globulin Ratio: 2 (ref 1.2–2.2)
Albumin: 5 g/dL — ABNORMAL HIGH (ref 3.8–4.8)
Alkaline Phosphatase: 82 IU/L (ref 44–121)
BUN/Creatinine Ratio: 15 (ref 9–23)
BUN: 8 mg/dL (ref 6–20)
Basophils Absolute: 0.2 10*3/uL (ref 0.0–0.2)
Basos: 1 %
Bilirubin Total: 2.5 mg/dL — ABNORMAL HIGH (ref 0.0–1.2)
CO2: 20 mmol/L (ref 20–29)
Calcium: 9.7 mg/dL (ref 8.7–10.2)
Chloride: 104 mmol/L (ref 96–106)
Creatinine, Ser: 0.55 mg/dL — ABNORMAL LOW (ref 0.57–1.00)
EOS (ABSOLUTE): 0.1 10*3/uL (ref 0.0–0.4)
Eos: 1 %
Ferritin: 321 ng/mL — ABNORMAL HIGH (ref 15–150)
Globulin, Total: 2.5 g/dL (ref 1.5–4.5)
Glucose: 80 mg/dL (ref 65–99)
Hematocrit: 24.8 % — ABNORMAL LOW (ref 34.0–46.6)
Hemoglobin: 8.4 g/dL — ABNORMAL LOW (ref 11.1–15.9)
Immature Grans (Abs): 0.1 10*3/uL (ref 0.0–0.1)
Immature Granulocytes: 1 %
Lymphocytes Absolute: 4.2 10*3/uL — ABNORMAL HIGH (ref 0.7–3.1)
Lymphs: 30 %
MCH: 30.1 pg (ref 26.6–33.0)
MCHC: 33.9 g/dL (ref 31.5–35.7)
MCV: 89 fL (ref 79–97)
Monocytes Absolute: 0.9 10*3/uL (ref 0.1–0.9)
Monocytes: 6 %
NRBC: 2 % — ABNORMAL HIGH (ref 0–0)
Neutrophils Absolute: 8.6 10*3/uL — ABNORMAL HIGH (ref 1.4–7.0)
Neutrophils: 61 %
Platelets: 760 10*3/uL — ABNORMAL HIGH (ref 150–450)
Potassium: 4.3 mmol/L (ref 3.5–5.2)
RBC: 2.79 x10E6/uL — ABNORMAL LOW (ref 3.77–5.28)
RDW: 15.9 % — ABNORMAL HIGH (ref 11.7–15.4)
Retic Ct Pct: 13.1 % — ABNORMAL HIGH (ref 0.6–2.6)
Sodium: 140 mmol/L (ref 134–144)
Total Protein: 7.5 g/dL (ref 6.0–8.5)
Vit D, 25-Hydroxy: 21.8 ng/mL — ABNORMAL LOW (ref 30.0–100.0)
WBC: 14 10*3/uL — ABNORMAL HIGH (ref 3.4–10.8)
eGFR: 123 mL/min/{1.73_m2} (ref >59)

## 2021-01-12 ENCOUNTER — Encounter (HOSPITAL_COMMUNITY): Payer: Self-pay | Admitting: Internal Medicine

## 2021-01-12 ENCOUNTER — Other Ambulatory Visit: Payer: Self-pay

## 2021-01-12 ENCOUNTER — Telehealth (HOSPITAL_COMMUNITY): Payer: Self-pay | Admitting: *Deleted

## 2021-01-12 ENCOUNTER — Observation Stay (HOSPITAL_COMMUNITY)
Admission: RE | Admit: 2021-01-12 | Discharge: 2021-01-13 | Disposition: A | Discharge status: Home / Self Care | Payer: BC Managed Care – PPO | Source: Ambulatory Visit | Attending: Internal Medicine | Admitting: Internal Medicine

## 2021-01-12 DIAGNOSIS — D5700 Hb-SS disease with crisis, unspecified: Secondary | ICD-10-CM | POA: Diagnosis not present

## 2021-01-12 DIAGNOSIS — Z20822 Contact with and (suspected) exposure to covid-19: Secondary | ICD-10-CM | POA: Insufficient documentation

## 2021-01-12 DIAGNOSIS — E876 Hypokalemia: Secondary | ICD-10-CM | POA: Diagnosis not present

## 2021-01-12 DIAGNOSIS — Z7982 Long term (current) use of aspirin: Secondary | ICD-10-CM | POA: Diagnosis not present

## 2021-01-12 DIAGNOSIS — D72829 Elevated white blood cell count, unspecified: Secondary | ICD-10-CM | POA: Diagnosis present

## 2021-01-12 DIAGNOSIS — D57219 Sickle-cell/Hb-C disease with crisis, unspecified: Secondary | ICD-10-CM | POA: Diagnosis present

## 2021-01-12 DIAGNOSIS — D75839 Thrombocytosis, unspecified: Secondary | ICD-10-CM | POA: Diagnosis not present

## 2021-01-12 LAB — CBC
HCT: 23.8 % — ABNORMAL LOW (ref 36.0–46.0)
Hemoglobin: 7.7 g/dL — ABNORMAL LOW (ref 12.0–15.0)
MCH: 30.2 pg (ref 26.0–34.0)
MCHC: 32.4 g/dL (ref 30.0–36.0)
MCV: 93.3 fL (ref 80.0–100.0)
Platelets: 601 10*3/uL — ABNORMAL HIGH (ref 150–400)
RBC: 2.55 MIL/uL — ABNORMAL LOW (ref 3.87–5.11)
RDW: 17.5 % — ABNORMAL HIGH (ref 11.5–15.5)
WBC: 11.9 10*3/uL — ABNORMAL HIGH (ref 4.0–10.5)
nRBC: 2.8 % — ABNORMAL HIGH (ref 0.0–0.2)

## 2021-01-12 LAB — CBC WITH DIFFERENTIAL/PLATELET
Abs Immature Granulocytes: 0.05 10*3/uL (ref 0.00–0.07)
Basophils Absolute: 0.1 10*3/uL (ref 0.0–0.1)
Basophils Relative: 1 %
Eosinophils Absolute: 0.1 10*3/uL (ref 0.0–0.5)
Eosinophils Relative: 1 %
HCT: 26.4 % — ABNORMAL LOW (ref 36.0–46.0)
Hemoglobin: 8.6 g/dL — ABNORMAL LOW (ref 12.0–15.0)
Immature Granulocytes: 1 %
Lymphocytes Relative: 33 %
Lymphs Abs: 2.9 10*3/uL (ref 0.7–4.0)
MCH: 30.2 pg (ref 26.0–34.0)
MCHC: 32.6 g/dL (ref 30.0–36.0)
MCV: 92.6 fL (ref 80.0–100.0)
Monocytes Absolute: 0.6 10*3/uL (ref 0.1–1.0)
Monocytes Relative: 7 %
Neutro Abs: 5.1 10*3/uL (ref 1.7–7.7)
Neutrophils Relative %: 57 %
Platelets: 679 10*3/uL — ABNORMAL HIGH (ref 150–400)
RBC: 2.85 MIL/uL — ABNORMAL LOW (ref 3.87–5.11)
RDW: 17.6 % — ABNORMAL HIGH (ref 11.5–15.5)
WBC: 8.8 10*3/uL (ref 4.0–10.5)
nRBC: 4.3 % — ABNORMAL HIGH (ref 0.0–0.2)

## 2021-01-12 LAB — URINALYSIS, ROUTINE W REFLEX MICROSCOPIC
Bilirubin Urine: NEGATIVE
Glucose, UA: NEGATIVE mg/dL
Hgb urine dipstick: NEGATIVE
Ketones, ur: NEGATIVE mg/dL
Leukocytes,Ua: NEGATIVE
Nitrite: NEGATIVE
Protein, ur: NEGATIVE mg/dL
Specific Gravity, Urine: 1.013 (ref 1.005–1.030)
pH: 5 (ref 5.0–8.0)

## 2021-01-12 LAB — RETICULOCYTES
Immature Retic Fract: 23 % — ABNORMAL HIGH (ref 2.3–15.9)
RBC.: 2.85 MIL/uL — ABNORMAL LOW (ref 3.87–5.11)
Retic Count, Absolute: 406.5 10*3/uL — ABNORMAL HIGH (ref 19.0–186.0)
Retic Ct Pct: 14 % — ABNORMAL HIGH (ref 0.4–3.1)

## 2021-01-12 LAB — COMPREHENSIVE METABOLIC PANEL
ALT: 18 U/L (ref 0–44)
AST: 23 U/L (ref 15–41)
Albumin: 4.7 g/dL (ref 3.5–5.0)
Alkaline Phosphatase: 78 U/L (ref 38–126)
Anion gap: 10 (ref 5–15)
BUN: 10 mg/dL (ref 6–20)
CO2: 23 mmol/L (ref 22–32)
Calcium: 8.9 mg/dL (ref 8.9–10.3)
Chloride: 109 mmol/L (ref 98–111)
Creatinine, Ser: 0.44 mg/dL (ref 0.44–1.00)
GFR, Estimated: >60 mL/min (ref >60)
Glucose, Bld: 97 mg/dL (ref 70–99)
Potassium: 3.4 mmol/L — ABNORMAL LOW (ref 3.5–5.1)
Sodium: 142 mmol/L (ref 135–145)
Total Bilirubin: 5.8 mg/dL — ABNORMAL HIGH (ref 0.3–1.2)
Total Protein: 7.8 g/dL (ref 6.5–8.1)

## 2021-01-12 LAB — PREGNANCY, URINE: Preg Test, Ur: NEGATIVE

## 2021-01-12 MED ORDER — POLYETHYLENE GLYCOL 3350 17 G PO PACK: 17.0000 g | PACK | Freq: Every day | ORAL | Status: DC | PRN

## 2021-01-12 MED ORDER — NALOXONE HCL 0.4 MG/ML IJ SOLN: 0.4000 mg | INTRAMUSCULAR | Status: DC | PRN

## 2021-01-12 MED ORDER — ENOXAPARIN SODIUM 40 MG/0.4ML ~~LOC~~ SOLN
40.0000 mg | SUBCUTANEOUS | Status: DC
Start: 2021-01-12 — End: 2021-01-13
  Administered 2021-01-12: 40 mg via SUBCUTANEOUS
  Filled 2021-01-12: qty 0.4

## 2021-01-12 MED ORDER — HYDROMORPHONE 1 MG/ML IV SOLN
INTRAVENOUS | Status: DC
Start: 2021-01-12 — End: 2021-01-13
  Administered 2021-01-12: 30 mg via INTRAVENOUS
  Administered 2021-01-12: 1.5 mg via INTRAVENOUS
  Administered 2021-01-12: 2 mg via INTRAVENOUS
  Administered 2021-01-13: 0.3 mg via INTRAVENOUS
  Administered 2021-01-13: 1.2 mg via INTRAVENOUS
  Filled 2021-01-12: qty 30

## 2021-01-12 MED ORDER — PREDNISOLONE ACETATE 1 % OP SUSP
1.0000 [drp] | Freq: Four times a day (QID) | OPHTHALMIC | Status: DC
  Filled 2021-01-12 (×2): qty 5

## 2021-01-12 MED ORDER — DIPHENHYDRAMINE HCL 12.5 MG/5ML PO ELIX: 12.5000 mg | ORAL_SOLUTION | Freq: Four times a day (QID) | ORAL | Status: DC | PRN

## 2021-01-12 MED ORDER — KETOROLAC TROMETHAMINE 15 MG/ML IJ SOLN
15.0000 mg | Freq: Four times a day (QID) | INTRAMUSCULAR | Status: DC
  Administered 2021-01-12 – 2021-01-13 (×3): 15 mg via INTRAVENOUS
  Filled 2021-01-12 (×3): qty 1

## 2021-01-12 MED ORDER — SENNOSIDES-DOCUSATE SODIUM 8.6-50 MG PO TABS
1.0000 | ORAL_TABLET | Freq: Two times a day (BID) | ORAL | Status: DC
  Administered 2021-01-12 – 2021-01-13 (×2): 1 via ORAL
  Filled 2021-01-12 (×2): qty 1

## 2021-01-12 MED ORDER — ONDANSETRON HCL 4 MG/2ML IJ SOLN
4.0000 mg | Freq: Four times a day (QID) | INTRAMUSCULAR | Status: DC | PRN
  Administered 2021-01-12 – 2021-01-13 (×4): 4 mg via INTRAVENOUS
  Filled 2021-01-12 (×4): qty 2

## 2021-01-12 MED ORDER — FOLIC ACID 1 MG PO TABS
1.0000 mg | ORAL_TABLET | Freq: Every day | ORAL | Status: DC
  Administered 2021-01-13: 1 mg via ORAL
  Filled 2021-01-12: qty 1

## 2021-01-12 MED ORDER — DORZOLAMIDE HCL-TIMOLOL MAL 2-0.5 % OP SOLN
1.0000 [drp] | Freq: Two times a day (BID) | OPHTHALMIC | Status: DC
  Administered 2021-01-12 – 2021-01-13 (×2): 1 [drp] via OPHTHALMIC
  Filled 2021-01-12: qty 10

## 2021-01-12 MED ORDER — SODIUM CHLORIDE 0.45 % IV SOLN: INTRAVENOUS | Status: DC

## 2021-01-12 MED ORDER — PRENATAL MULTIVITAMIN CH
1.0000 | ORAL_TABLET | Freq: Every day | ORAL | Status: DC
  Filled 2021-01-12: qty 1

## 2021-01-12 MED ORDER — ASPIRIN EC 81 MG PO TBEC
81.0000 mg | DELAYED_RELEASE_TABLET | Freq: Every day | ORAL | Status: DC
  Administered 2021-01-13: 81 mg via ORAL
  Filled 2021-01-12: qty 1

## 2021-01-12 MED ORDER — LORATADINE 10 MG PO TABS
10.0000 mg | ORAL_TABLET | Freq: Every day | ORAL | Status: DC
  Administered 2021-01-13: 10 mg via ORAL
  Filled 2021-01-12: qty 1

## 2021-01-12 MED ORDER — SODIUM CHLORIDE 0.9% FLUSH: 9.0000 mL | INTRAVENOUS | Status: DC | PRN

## 2021-01-12 MED ORDER — OXYCODONE HCL 5 MG PO TABS: 5.0000 mg | ORAL_TABLET | ORAL | Status: DC | PRN

## 2021-01-12 NOTE — Telephone Encounter (Signed)
Patient called requesting to come to the day hospital for sickle cell pain. Patient reports bilateral hips and leg pain rated 6/10. Reports having pain since last Tuesday. Patient reports taking Oxycodone at 4:00 am and Ibuprofen at 6:00 am. COVID-19 screening done and patient denies all symptoms and exposures. Admits to some nausea and abdominal pain. Also admits to having chest pain on and off but currently denies chest pain. Denies fever, vomiting and diarrhea. Admits to having transportation without driving self. [place], [place] notified. Patient can come to the day hospital for pain management. Patient advised and expresses an understanding.

## 2021-01-12 NOTE — Progress Notes (Signed)
Patient admitted to the day infusion hospital for sickle cell pain crisis. Initially, patient reported bilateral hip, leg and abdominal pain rated 6/10.  For pain management, patient placed on Dilaudid PCA and hydrated with IV fluids. Patient's pain decreased to 1/10 temporarily but went back up to 6/10. Provider assessed patient and placed orders for admission to inpatient unit for continued pain management and hydration. Report called to Collins [place], RN on [place]. Patient transferred to [place] by RN in wheelchair. Vital signs wnl. Patient alert, oriented and stable at transfer.

## 2021-01-12 NOTE — Progress Notes (Signed)
[facility] History and Physical   Date: 01/12/2021  Patient name: Krista [name] Medical record number: [id number] Date of birth: January 27, 1986 Age: 35 y.o. Gender: female PCP: Dorena Dew, FNP  Attending physician: Tresa Garter, MD  Chief Complaint: Sickle cell pain  History of Present Illness: Krista [name] is a 35 year old female with a medical history significant for sickle cell disease that presents with complaints of generalized pain that is consistent with his typical sickle cell pain crisis.  Patient typically has very well-controlled sickle cell disease, but has had increased pain crisis over the past several months.  Patient had a baby several months ago and has been having frequent sickle cell crises since that time.  She states that pain intensity increased 1 week ago and has been uncontrolled by home oxycodone.  She last had oxycodone this a.m. without sustained relief.  She says that pain is primarily to upper and lower extremities and low back.  She also endorses some central chest pain that has been waxing and waning.  She has not identified any palliative or provocative factors concerning current crisis.  Her current pain intensity is 8/10 characterized as intermittent and throbbing.  She denies any headache, shortness of breath, dizziness, or paresthesias.  No urinary symptoms, nausea, vomiting, or diarrhea.  Patient has had no sick contacts, recent travel, or exposure to COVID-19.  Meds: Medications Prior to Admission  Medication Sig Dispense Refill Last Dose   aspirin EC 81 MG tablet Take 1 tablet (81 mg total) by mouth daily. 30 tablet 5 01/12/2021 at Unknown time   Bromfenac Sodium (PROLENSA) 0.07 % SOLN Place 1 drop into the left eye 4 (four) times daily. 6 mL 3 01/12/2021 at Unknown time   dorzolamide-timolol (COSOPT) 22.3-6.8 MG/ML ophthalmic solution Place 1 drop into the left eye 2 (two) times daily. 10 mL 5 01/12/2021 at Unknown time    ergocalciferol (VITAMIN D2) 1.25 MG (50000 UT) capsule Take 1 capsule (50,000 Units total) by mouth once a week. (Patient taking differently: Take 50,000 Units by mouth every Monday.) 12 capsule 1 Past Week at Unknown time   folic acid (FOLVITE) 1 MG tablet Take 1 tablet (1 mg total) by mouth daily. 90 tablet 3 01/12/2021 at Unknown time   ibuprofen (ADVIL) 800 MG tablet Take 1 tablet (800 mg total) by mouth every 8 (eight) hours as needed. (Patient taking differently: Take 800 mg by mouth every 8 (eight) hours as needed for moderate pain.) 60 tablet 1 01/12/2021 at Unknown time   oxycodone (OXY-IR) 5 MG capsule Take 1 capsule (5 mg total) by mouth every 4 (four) hours as needed. (Patient taking differently: Take 5 mg by mouth every 4 (four) hours as needed for pain.) 30 capsule 0 01/12/2021 at Unknown time   prednisoLONE acetate (PRED FORTE) 1 % ophthalmic suspension Place 1 drop into the left eye 4 (four) times daily. 15 mL 5 01/12/2021 at Unknown time   Prenatal Vit-Fe Fumarate-FA (PRENATAL MULTIVITAMIN) TABS tablet Take 1 tablet by mouth daily at 12 noon.   01/12/2021 at Unknown time   promethazine (PHENERGAN) 12.5 MG tablet Take 1 tablet (12.5 mg total) by mouth every 6 (six) hours as needed for nausea or vomiting. 30 tablet 0 01/12/2021 at Unknown time   cetirizine (ZYRTEC) 10 MG tablet Take 10 mg by mouth daily as needed for allergies.   More than a month at Unknown time   mometasone (ELOCON) 0.1 % cream Apply 1 application topically  as needed. (Patient not taking: Reported on 01/06/2021) 45 g 2    norethindrone (MICRONOR) 0.35 MG tablet Take 1 tablet by mouth daily.       Allergies: Keflex [cephalexin] Past Medical History:  Diagnosis Date   Chronic pain    with sickle cell    Eczema    Heart murmur    07-13-2019  per pt since childhood,  has not ever had a echo done ,  asymptomatic   History of blood transfusion    x3    Missed ab    Sickle cell anemia ([facility])    followed by  pcp---  last crisis 12-14-2018   Vitamin D deficiency    Wears glasses    Past Surgical History:  Procedure Laterality Date   EYE SURGERY Left 11/12/2019   LAPAROSCOPIC CHOLECYSTECTOMY  1997   Family History  Problem Relation Age of Onset   Diabetes Father    Hypertension Father    Heart disease Brother    Cancer Maternal Grandmother    Heart disease Brother    Breast cancer Paternal Aunt    Cancer Paternal Uncle    Social History   Socioeconomic History   Marital status: Married    Spouse name: n/a   Number of children: 0   Years of education: 16+   Highest education level: Not on file  Occupational History   Occupation: TEACHER    Employer: [employer]  Tobacco Use   Smoking status: Never Smoker   Smokeless tobacco: Never Used  Scientific laboratory technician Use: Never used  Substance and Sexual Activity   Alcohol use: Not Currently    Comment: socially   Drug use: Never   Sexual activity: Yes    Birth control/protection: None  Other Topics Concern   Not on file  Social History Narrative   ** Merged History Encounter **       Lives alone.  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   Social Determinants of Health   Financial Resource Strain: Not on file  Food Insecurity: Not on file  Transportation Needs: Not on file  Physical Activity: Not on file  Stress: Not on file  Social Connections: Not on file  Intimate Partner Violence: Not on file   Review of Systems  Constitutional: Negative.   HENT: Negative.   Eyes: Negative.   Respiratory: Negative.   Cardiovascular: Positive for chest pain.  Gastrointestinal: Negative.   Genitourinary: Negative.   Musculoskeletal: Positive for back pain and joint pain.  Skin: Negative.   Neurological: Negative.   Psychiatric/Behavioral: Negative.     Physical Exam: Blood pressure 112/77, pulse 99, temperature 98.1 F  (36.7 C), temperature source Temporal, resp. rate 15, last menstrual period 12/30/2020, SpO2 97 %, unknown if currently breastfeeding. BP 130/76 (BP Location: Right Arm)    Pulse 71    Temp 97.6 F (36.4 C) (Oral)    Resp 18    Ht 5\' 7"  (1.702 m)    Wt 71.9 kg    LMP 12/30/2020    SpO2 96%    BMI 24.84 kg/m   General Appearance:    Alert, cooperative, no distress, appears stated age  Head:    Normocephalic, without obvious abnormality, atraumatic  Eyes:    PERRL, conjunctiva/corneas clear, EOM's intact, fundi    benign, both eyes  Ears:    Normal TM's and external ear canals, both ears  Throat:  Lips, mucosa, and tongue normal; teeth and gums normal  Neck:   Supple, symmetrical, trachea midline, no adenopathy;    thyroid:  no enlargement/tenderness/nodules; no carotid   bruit or JVD  Back:     Symmetric, no curvature, ROM normal, no CVA tenderness  Lungs:     Clear to auscultation bilaterally, respirations unlabored  Chest Wall:    No tenderness or deformity   Heart:    Regular rate and rhythm, S1 and S2 normal, no murmur, rub   or gallop  Abdomen:     Soft, non-tender, bowel sounds active all four quadrants,    no masses, no organomegaly  Extremities:   Extremities normal, atraumatic, no cyanosis or edema  Pulses:   2+ and symmetric all extremities  Skin:   Skin color, texture, turgor normal, no rashes or lesions  Lymph nodes:   Cervical, supraclavicular, and axillary nodes normal  Neurologic:   CNII-XII intact, normal strength, sensation and reflexes    throughout     Lab results: Results for orders placed or performed during the hospital encounter of 01/12/21 (from the past 24 hour(s))  Comprehensive metabolic panel     Status: Abnormal   Collection Time: 01/12/21 11:09 AM  Result Value Ref Range   Sodium 142 135 - 145 mmol/L   Potassium 3.4 (L) 3.5 - 5.1 mmol/L   Chloride 109 98 - 111 mmol/L   CO2 23 22 - 32 mmol/L   Glucose, Bld 97 70 - 99 mg/dL   BUN 10 6 - 20 mg/dL    Creatinine, Ser 0.44 0.44 - 1.00 mg/dL   Calcium 8.9 8.9 - 10.3 mg/dL   Total Protein 7.8 6.5 - 8.1 g/dL   Albumin 4.7 3.5 - 5.0 g/dL   AST 23 15 - 41 U/L   ALT 18 0 - 44 U/L   Alkaline Phosphatase 78 38 - 126 U/L   Total Bilirubin 5.8 (H) 0.3 - 1.2 mg/dL   GFR, Estimated >60 >60 mL/min   Anion gap 10 5 - 15  CBC WITH DIFFERENTIAL     Status: Abnormal   Collection Time: 01/12/21 11:09 AM  Result Value Ref Range   WBC 8.8 4.0 - 10.5 K/uL   RBC 2.85 (L) 3.87 - 5.11 MIL/uL   Hemoglobin 8.6 (L) 12.0 - 15.0 g/dL   HCT 26.4 (L) 36.0 - 46.0 %   MCV 92.6 80.0 - 100.0 fL   MCH 30.2 26.0 - 34.0 pg   MCHC 32.6 30.0 - 36.0 g/dL   RDW 17.6 (H) 11.5 - 15.5 %   Platelets 679 (H) 150 - 400 K/uL   nRBC 4.3 (H) 0.0 - 0.2 %   Neutrophils Relative % 57 %   Neutro Abs 5.1 1.7 - 7.7 K/uL   Lymphocytes Relative 33 %   Lymphs Abs 2.9 0.7 - 4.0 K/uL   Monocytes Relative 7 %   Monocytes Absolute 0.6 0.1 - 1.0 K/uL   Eosinophils Relative 1 %   Eosinophils Absolute 0.1 0.0 - 0.5 K/uL   Basophils Relative 1 %   Basophils Absolute 0.1 0.0 - 0.1 K/uL   Immature Granulocytes 1 %   Abs Immature Granulocytes 0.05 0.00 - 0.07 K/uL   Polychromasia PRESENT    Sickle Cells MARKED    Target Cells PRESENT   Reticulocytes     Status: Abnormal   Collection Time: 01/12/21 11:09 AM  Result Value Ref Range   Retic Ct Pct 14.0 (H) 0.4 - 3.1 %  RBC. 2.85 (L) 3.87 - 5.11 MIL/uL   Retic Count, Absolute 406.5 (H) 19.0 - 186.0 K/uL   Immature Retic Fract 23.0 (H) 2.3 - 15.9 %  Pregnancy, urine     Status: None   Collection Time: 01/12/21 11:09 AM  Result Value Ref Range   Preg Test, Ur NEGATIVE NEGATIVE  Urinalysis, Routine w reflex microscopic     Status: None   Collection Time: 01/12/21 11:09 AM  Result Value Ref Range   Color, Urine YELLOW YELLOW   APPearance CLEAR CLEAR   Specific Gravity, Urine 1.013 1.005 - 1.030   pH 5.0 5.0 - 8.0   Glucose, UA NEGATIVE NEGATIVE mg/dL   Hgb urine dipstick NEGATIVE  NEGATIVE   Bilirubin Urine NEGATIVE NEGATIVE   Ketones, ur NEGATIVE NEGATIVE mg/dL   Protein, ur NEGATIVE NEGATIVE mg/dL   Nitrite NEGATIVE NEGATIVE   Leukocytes,Ua NEGATIVE NEGATIVE    Imaging results:  No results found.   Assessment & Plan: Patient admitted to sickle cell day infusion center for management of pain crisis.  Patient is opiate naive Initiate IV dilaudid PCA, full dose IV fluids, 0.45% saline at 150 mL/h Toradol 15 mg IV times one dose Tylenol 1000 mg by mouth times one dose Review CBC with differential, complete metabolic panel, and reticulocytes as results become available.  Pain intensity will be reevaluated in context of functioning and relationship to baseline as care progresses If pain intensity remains elevated and/or sudden change in hemodynamic stability transition to inpatient services for higher level of care.      [name]  APRN, MSN, FNP-C [facility] [address] [place], [place] [address] [phone]  01/12/2021, 12:21 PM

## 2021-01-13 ENCOUNTER — Other Ambulatory Visit: Payer: Self-pay | Admitting: Family Medicine

## 2021-01-13 DIAGNOSIS — D57219 Sickle-cell/Hb-C disease with crisis, unspecified: Secondary | ICD-10-CM | POA: Diagnosis not present

## 2021-01-13 DIAGNOSIS — D5700 Hb-SS disease with crisis, unspecified: Secondary | ICD-10-CM | POA: Diagnosis not present

## 2021-01-13 DIAGNOSIS — E876 Hypokalemia: Secondary | ICD-10-CM

## 2021-01-13 DIAGNOSIS — D571 Sickle-cell disease without crisis: Secondary | ICD-10-CM

## 2021-01-13 LAB — COMPREHENSIVE METABOLIC PANEL
ALT: 16 U/L (ref 0–44)
AST: 19 U/L (ref 15–41)
Albumin: 3.8 g/dL (ref 3.5–5.0)
Alkaline Phosphatase: 62 U/L (ref 38–126)
Anion gap: 7 (ref 5–15)
BUN: 7 mg/dL (ref 6–20)
CO2: 22 mmol/L (ref 22–32)
Calcium: 8.2 mg/dL — ABNORMAL LOW (ref 8.9–10.3)
Chloride: 109 mmol/L (ref 98–111)
Creatinine, Ser: 0.56 mg/dL (ref 0.44–1.00)
GFR, Estimated: >60 mL/min (ref >60)
Glucose, Bld: 77 mg/dL (ref 70–99)
Potassium: 3.2 mmol/L — ABNORMAL LOW (ref 3.5–5.1)
Sodium: 138 mmol/L (ref 135–145)
Total Bilirubin: 3.8 mg/dL — ABNORMAL HIGH (ref 0.3–1.2)
Total Protein: 6.2 g/dL — ABNORMAL LOW (ref 6.5–8.1)

## 2021-01-13 LAB — LACTATE DEHYDROGENASE: LDH: 168 U/L (ref 98–192)

## 2021-01-13 LAB — SARS CORONAVIRUS 2 (TAT 6-24 HRS): SARS Coronavirus 2: NEGATIVE

## 2021-01-13 MED ORDER — PROMETHAZINE HCL 25 MG PO TABS
12.5000 mg | ORAL_TABLET | ORAL | Status: DC | PRN
  Administered 2021-01-13: 12.5 mg via ORAL
  Filled 2021-01-13: qty 1

## 2021-01-13 MED ORDER — PROMETHAZINE HCL 12.5 MG PO TABS: 12.5000 mg | ORAL_TABLET | Freq: Four times a day (QID) | ORAL | 0 refills | Status: DC | PRN

## 2021-01-13 MED ORDER — L-GLUTAMINE ORAL POWDER: 5.0000 g | PACK | Freq: Two times a day (BID) | ORAL | 1 refills | Status: DC

## 2021-01-13 NOTE — Progress Notes (Unsigned)
Orders Placed This Encounter  Procedures  . Basic metabolic panel    Standing Status:   Future    Standing Expiration Date:   01/13/2022  . CBC    Standing Status:   Future    Standing Expiration Date:   01/13/2022     [name]  APRN, MSN, FNP-C Patient [facility] [address] [place], [place] [address] [phone]

## 2021-01-13 NOTE — Progress Notes (Signed)
Meds ordered this encounter  Medications  . L-glutamine (ENDARI) 5 g PACK Powder Packet    Sig: Take 5 g by mouth 2 (two) times daily.    Dispense:  180 each    Refill:  1    Order Specific Question:   Supervising Provider    Answer:   Tresa Garter [[id number]]     [name]  APRN, MSN, FNP-C Patient [facility] [address] [place], [place] [address] [phone]

## 2021-01-13 NOTE — Plan of Care (Signed)
  Problem: Education: Goal: Knowledge of General Education information will improve Description: Including pain rating scale, medication(s)/side effects and non-pharmacologic comfort measures Outcome: Completed/Met   Problem: Health Behavior/Discharge Planning: Goal: Ability to manage health-related needs will improve Outcome: Completed/Met   Problem: Clinical Measurements: Goal: Ability to maintain clinical measurements within normal limits will improve Outcome: Completed/Met Goal: Will remain free from infection Outcome: Completed/Met Goal: Diagnostic test results will improve Outcome: Completed/Met Goal: Respiratory complications will improve Outcome: Completed/Met Goal: Cardiovascular complication will be avoided Outcome: Completed/Met   Problem: Nutrition: Goal: Adequate nutrition will be maintained Outcome: Completed/Met   Problem: Activity: Goal: Risk for activity intolerance will decrease Outcome: Completed/Met   Problem: Coping: Goal: Level of anxiety will decrease Outcome: Completed/Met   Problem: Elimination: Goal: Will not experience complications related to bowel motility Outcome: Completed/Met Goal: Will not experience complications related to urinary retention Outcome: Completed/Met   Problem: Pain Managment: Goal: General experience of comfort will improve Outcome: Completed/Met   Problem: Safety: Goal: Ability to remain free from injury will improve Outcome: Completed/Met   Problem: Skin Integrity: Goal: Risk for impaired skin integrity will decrease Outcome: Completed/Met   

## 2021-01-13 NOTE — Discharge Instructions (Signed)
Sickle Cell Anemia, Adult  Sickle cell anemia is a condition where your red blood cells are shaped like sickles. Red blood cells carry oxygen through the body. Sickle-shaped cells do not live as long as normal red blood cells. They also clump together and block blood from flowing through the blood vessels. This prevents the body from getting enough oxygen. Sickle cell anemia causes organ damage and pain. It also increases the risk of infection. Follow these instructions at home: Medicines  Take over-the-counter and prescription medicines only as told by your doctor.  If you were prescribed an antibiotic medicine, take it as told by your doctor. Do not stop taking the antibiotic even if you start to feel better.  If you develop a fever, do not take medicines to lower the fever right away. Tell your doctor about the fever. Managing pain, stiffness, and swelling  Try these methods to help with pain: ? Use a heating pad. ? Take a warm bath. ? Distract yourself, such as by watching TV. Eating and drinking  Drink enough fluid to keep your pee (urine) clear or pale yellow. Drink more in hot weather and during exercise.  Limit or avoid alcohol.  Eat a healthy diet. Eat plenty of fruits, vegetables, whole grains, and lean protein.  Take vitamins and supplements as told by your doctor. Traveling  When traveling, keep these with you: ? Your medical information. ? The names of your doctors. ? Your medicines.  If you need to take an airplane, talk to your doctor first. Activity  Rest often.  Avoid exercises that make your heart beat much faster, such as jogging. General instructions  Do not use products that have nicotine or tobacco, such as cigarettes and e-cigarettes. If you need help quitting, ask your doctor.  Consider wearing a medical alert bracelet.  Avoid being in high places (high altitudes), such as mountains.  Avoid very hot or cold temperatures.  Avoid places where the  temperature changes a lot.  Keep all follow-up visits as told by your doctor. This is important. Contact a doctor if:  A joint hurts.  Your feet or hands hurt or swell.  You feel tired (fatigued). Get help right away if:  You have symptoms of infection. These include: ? Fever. ? Chills. ? Being very tired. ? Irritability. ? Poor eating. ? Throwing up (vomiting).  You feel dizzy or faint.  You have new stomach pain, especially on the left side.  You have a an erection (priapism) that lasts more than 4 hours.  You have numbness in your arms or legs.  You have a hard time moving your arms or legs.  You have trouble talking.  You have pain that does not go away when you take medicine.  You are short of breath.  You are breathing fast.  You have a long-term cough.  You have pain in your chest.  You have a bad headache.  You have a stiff neck.  Your stomach looks bloated even though you did not eat much.  Your skin is pale.  You suddenly cannot see well. Summary  Sickle cell anemia is a condition where your red blood cells are shaped like sickles.  Follow your doctor's advice on ways to manage pain, food to eat, activities to do, and steps to take for safe travel.  Get medical help right away if you have any signs of infection, such as a fever. This information is not intended to replace advice given to you by   your health care provider. Make sure you discuss any questions you have with your health care provider. Document Revised: 02/28/2020 Document Reviewed: 02/28/2020 Elsevier Patient Education  2021 [employer]  

## 2021-01-13 NOTE — H&P (Signed)
H&P  Patient Demographics:  Krista [name], is a 35 y.o. female  MRN: [id number]   DOB - 1986-03-01  Admit Date - 01/12/2021  Outpatient Primary MD for the patient is Krista Dew, FNP      HPI:  History of present illness:  Krista [name] is a 35 year old female with a medical history significant for sickle cell disease that presents with complaints of generalized pain that is consistent with his typical sickle cell pain crisis.  Patient typically has very well-controlled sickle cell disease, but has had increased pain crisis over the past several months.  Patient had a baby several months ago and has been having frequent sickle cell crises since that time.  She states that pain intensity increased 1 week ago and has been uncontrolled by home oxycodone.  She last had oxycodone this a.m. without sustained relief.  She says that pain is primarily to upper and lower extremities and low back.  She also endorses some central chest pain that has been waxing and waning.  She has not identified any palliative or provocative factors concerning current crisis.  Her current pain intensity is 8/10 characterized as intermittent and throbbing.  She denies any headache, shortness of breath, dizziness, or paresthesias.  No urinary symptoms, nausea, vomiting, or diarrhea.  Patient has had no sick contacts, recent travel, or exposure to COVID-19.   Sickle cell day infusion center course: Complete blood count shows: Hemoglobin 7.7, platelets 6.1, and WBCs 11.9 Comprehensive metabolic, shows: Potassium 3.2, sodium 138, chloride 109, CO2 22, creatinine 0.56, and T bili 3.8. Patient is hemodynamically stable.  Oxygen saturation 97% on RA.  COVID-19 test negative.  Pain persists despite IV Dilaudid PCA, IV Toradol, Tylenol, and IV fluids.  Admit to [place] and observation for further management of pain crisis.      Review of systems:   Review of Systems  Constitutional: Negative.   HENT: Negative.   Eyes:  Negative.   Respiratory: Negative for sputum production and shortness of breath.   Cardiovascular: Positive for chest pain.  Gastrointestinal: Negative.   Genitourinary: Negative.   Musculoskeletal: Positive for back pain and joint pain.  Skin: Negative.   Neurological: Negative.   Endo/Heme/Allergies: Negative.   Psychiatric/Behavioral: Negative.      With Past History of the following :   Past Medical History:  Diagnosis Date  . Chronic pain    with sickle cell   . Eczema   . Heart murmur    07-13-2019  per pt since childhood,  has not ever had a echo done ,  asymptomatic  . History of blood transfusion    x3   . Missed ab   . Sickle cell anemia ([place])    followed by pcp---  last crisis 12-14-2018  . Vitamin D deficiency   . Wears glasses       Past Surgical History:  Procedure Laterality Date  . EYE SURGERY Left 11/12/2019  . LAPAROSCOPIC CHOLECYSTECTOMY  1997     Social History:   Social History   Tobacco Use  . Smoking status: Never Smoker  . Smokeless tobacco: Never Used  Substance Use Topics  . Alcohol use: Not Currently    Comment: socially     Lives - At home   Family History :   Family History  Problem Relation Age of Onset  . Diabetes Father   . Hypertension Father   . Heart disease Brother   . Cancer Maternal Grandmother   . Heart  disease Brother   . Breast cancer Paternal Aunt   . Cancer Paternal Uncle      Home Medications:   Prior to Admission medications   Medication Sig Start Date End Date Taking? Authorizing Provider  aspirin EC 81 MG tablet Take 1 tablet (81 mg total) by mouth daily. 09/05/20  Yes Krista Dew, FNP  Bromfenac Sodium (PROLENSA) 0.07 % SOLN Place 1 drop into the left eye 4 (four) times daily. 09/03/20  Yes Bernarda Caffey, MD  dorzolamide-timolol (COSOPT) 22.3-6.8 MG/ML ophthalmic solution Place 1 drop into the left eye 2 (two) times daily. 08/27/20  Yes Bernarda Caffey, MD  ergocalciferol (VITAMIN D2) 1.25 MG  (50000 UT) capsule Take 1 capsule (50,000 Units total) by mouth once a week. Patient taking differently: Take 50,000 Units by mouth every Monday. 11/12/20  Yes Krista Dew, FNP  folic acid (FOLVITE) 1 MG tablet Take 1 tablet (1 mg total) by mouth daily. 07/29/20  Yes Krista Dew, FNP  ibuprofen (ADVIL) 800 MG tablet Take 1 tablet (800 mg total) by mouth every 8 (eight) hours as needed. Patient taking differently: Take 800 mg by mouth every 8 (eight) hours as needed for moderate pain. 07/29/20  Yes Krista Dew, FNP  oxycodone (OXY-IR) 5 MG capsule Take 1 capsule (5 mg total) by mouth every 4 (four) hours as needed. Patient taking differently: Take 5 mg by mouth every 4 (four) hours as needed for pain. 07/31/20  Yes Krista Dew, FNP  prednisoLONE acetate (PRED FORTE) 1 % ophthalmic suspension Place 1 drop into the left eye 4 (four) times daily. 08/27/20  Yes Bernarda Caffey, MD  Prenatal Vit-Fe Fumarate-FA (PRENATAL MULTIVITAMIN) TABS tablet Take 1 tablet by mouth daily at 12 noon.   Yes [provider]  promethazine (PHENERGAN) 12.5 MG tablet Take 1 tablet (12.5 mg total) by mouth every 6 (six) hours as needed for nausea or vomiting. 01/13/21  Yes Krista Dew, FNP  cetirizine (ZYRTEC) 10 MG tablet Take 10 mg by mouth daily as needed for allergies.    [provider]  norethindrone (MICRONOR) 0.35 MG tablet Take 1 tablet by mouth daily. 12/03/20   [provider]  promethazine (PHENERGAN) 12.5 MG tablet Take 1 tablet (12.5 mg total) by mouth every 6 (six) hours as needed for nausea or vomiting. 01/13/21   Krista Dew, FNP     Allergies:   Allergies  Allergen Reactions  . Keflex [Cephalexin] Swelling    Face swelling     Physical Exam:   Vitals:   Vitals:   01/13/21 0732 01/13/21 0924  BP:  130/76  Pulse:  71  Resp: 16 18  Temp:  97.6 F (36.4 C)  SpO2: 98% 96%    Physical Exam: Constitutional: Patient appears well-developed  and well-nourished. Not in obvious distress. HENT: Normocephalic, atraumatic, External right and left ear normal. Oropharynx is clear and moist.  Eyes: Conjunctivae and EOM are normal. PERRLA, no scleral icterus. Neck: Normal ROM. Neck supple. No JVD. No tracheal deviation. No thyromegaly. CVS: RRR, S1/S2 +, no murmurs, no gallops, no carotid bruit.  Pulmonary: Effort and breath sounds normal, no stridor, rhonchi, wheezes, rales.  Abdominal: Soft. BS +, no distension, tenderness, rebound or guarding.  Musculoskeletal: Normal range of motion. No edema and no tenderness.  Lymphadenopathy: No lymphadenopathy noted, cervical, inguinal or axillary Neuro: Alert. Normal reflexes, muscle tone coordination. No cranial nerve deficit. Skin: Skin is warm and dry. No rash noted. Not diaphoretic. No  erythema. No pallor. Psychiatric: Normal mood and affect. Behavior, judgment, thought content normal.   Data Review:   CBC Recent Labs  Lab 01/12/21 1109 01/12/21 1741  WBC 8.8 11.9*  HGB 8.6* 7.7*  HCT 26.4* 23.8*  PLT 679* 601*  MCV 92.6 93.3  MCH 30.2 30.2  MCHC 32.6 32.4  RDW 17.6* 17.5*  LYMPHSABS 2.9  --   MONOABS 0.6  --   EOSABS 0.1  --   BASOSABS 0.1  --    ------------------------------------------------------------------------------------------------------------------  Chemistries  Recent Labs  Lab 01/12/21 1109 01/13/21 0534  NA 142 138  K 3.4* 3.2*  CL 109 109  CO2 23 22  GLUCOSE 97 77  BUN 10 7  CREATININE 0.44 0.56  CALCIUM 8.9 8.2*  AST 23 19  ALT 18 16  ALKPHOS 78 62  BILITOT 5.8* 3.8*   ------------------------------------------------------------------------------------------------------------------ estimated creatinine clearance is 96.4 mL/min (by C-G formula based on SCr of 0.56 mg/dL). ------------------------------------------------------------------------------------------------------------------ No results for input(s): TSH, T4TOTAL, T3FREE, THYROIDAB in  the last 72 hours.  Invalid input(s): FREET3  Coagulation profile No results for input(s): INR, PROTIME in the last 168 hours. ------------------------------------------------------------------------------------------------------------------- No results for input(s): DDIMER in the last 72 hours. -------------------------------------------------------------------------------------------------------------------  Cardiac Enzymes No results for input(s): CKMB, TROPONINI, MYOGLOBIN in the last 168 hours.  Invalid input(s): CK ------------------------------------------------------------------------------------------------------------------ No results found for: BNP  ---------------------------------------------------------------------------------------------------------------  Urinalysis    Component Value Date/Time   COLORURINE YELLOW 01/12/2021 1109   APPEARANCEUR CLEAR 01/12/2021 1109   APPEARANCEUR Clear 08/26/2020 1050   LABSPEC 1.013 01/12/2021 1109   PHURINE 5.0 01/12/2021 1109   GLUCOSEU NEGATIVE 01/12/2021 1109   [place] 01/12/2021 1109   [place] 01/12/2021 1109   BILIRUBINUR neg 01/06/2021 1147   BILIRUBINUR Negative 08/26/2020 [place] 01/12/2021 1109   PROTEINUR NEGATIVE 01/12/2021 1109   UROBILINOGEN 1.0 01/06/2021 1147   UROBILINOGEN 4.0 (H) 06/08/2017 1634   NITRITE NEGATIVE 01/12/2021 1109   [place] 01/12/2021 1109    ----------------------------------------------------------------------------------------------------------------   Imaging Results:    No results found.   Assessment & Plan:  Principal Problem:   Sickle cell pain crisis ([place]) Active Problems:   Thrombocytosis   Leukocytosis   Hypokalemia  Sickle cell disease with pain crisis: Admit patient to [place] and observation for further management of pain crisis. Continue IV fluids, 0.45% saline at 150 mL/h Continue IV Dilaudid PCA per full dose  being that patient is opiate nave. IV Toradol 15 mg every 6 hours Oxycodone 5 mg every 6 hours as needed for severe breakthrough pain Monitor vital signs very closely, reevaluate pain scale regularly, and supplemental oxygen as needed  Sickle cell anemia: Hemoglobin 7.7, slightly below patient's baseline.  Patient is typically not transfused unless hemoglobin is less than 7.0 g/dL.  Continue to monitor closely.  CBC in AM.  Hypokalemia: Potassium slightly decreased.  Monitor closely.  BMP in a.m.  Thrombocytosis: Platelets 601.  Chronically elevated.  Continue aspirin 81 mg daily.  Leukocytosis: WBC slightly elevated.  Considered to be secondary to sickle cell pain crisis.  Urinalysis and urine culture pending.  COVID-19 test pending.  Repeat CBC in AM.   DVT Prophylaxis: Subcut Lovenox and SCDs  AM Labs Ordered, also please review Full Orders  Family Communication: Admission, patient's condition and plan of care including tests being ordered have been discussed with the patient who indicate understanding and agree with the plan and Code Status.  Code Status: Full Code  Consults called: None  Admission status: Inpatient    Time spent in minutes : 35 minutes   01/13/2021 at 5:48 PM

## 2021-01-13 NOTE — Discharge Summary (Signed)
Physician Discharge Summary  Krista [name] WCB:[id number] DOB: Sep 06, 1986 DOA: 01/12/2021  PCP: Dorena Dew, FNP  Admit date: 01/12/2021  Discharge date: 01/13/2021  Discharge Diagnoses:  Active Problems:   Sickle cell pain crisis [facility])   Discharge Condition: Stable  Disposition:  Pt is discharged home in good condition and is to follow up with Dorena Dew, FNP this week to have labs evaluated. [name] is instructed to increase activity slowly and balance with rest for the next few days, and use prescribed medication to complete treatment of pain  Diet: Regular Wt Readings from Last 3 Encounters:  01/13/21 71.9 kg  01/06/21 71.9 kg  12/09/20 69.5 kg    History of present illness:  Krista [name] is a 35 year old female with a medical history significant for sickle cell disease that presents with complaints of generalized pain that is consistent with his typical sickle cell pain crisis.  Patient typically has very well-controlled sickle cell disease, but has had increased pain crisis over the past several months.  Patient had a baby several months ago and has been having frequent sickle cell crises since that time.  She states that pain intensity increased 1 week ago and has been uncontrolled by home oxycodone.  She last had oxycodone this a.m. without sustained relief.  She says that pain is primarily to upper and lower extremities and low back.  She also endorses some central chest pain that has been waxing and waning.  She has not identified any palliative or provocative factors concerning current crisis.  Her current pain intensity is 8/10 characterized as intermittent and throbbing.  She denies any headache, shortness of breath, dizziness, or paresthesias.  No urinary symptoms, nausea, vomiting, or diarrhea.  Patient has had no sick contacts, recent travel, or exposure to COVID-19.   Sickle cell day infusion center course: Complete blood count shows: Hemoglobin 7.7,  platelets 6.1, and WBCs 11.9 Comprehensive metabolic, shows: Potassium 3.2, sodium 138, chloride 109, CO2 22, creatinine 0.56, and T bili 3.8. Patient is hemodynamically stable.  Oxygen saturation 97% on RA.  COVID-19 test negative.  Pain persists despite IV Dilaudid PCA, IV Toradol, Tylenol, and IV fluids.  Admit to [place] and observation for further management of pain crisis.     Hospital Course:  Sickle cell disease with pain crisis: Patient was admitted for sickle cell pain crisis and managed appropriately with IVF, IV Dilaudid via PCA and IV Toradol, as well as other adjunct therapies per sickle cell pain management protocols.  Patient states that pain intensity has improved greatly overnight.  She rates pain as 3/10 and is requesting discharge home.  Patient is alert, oriented, and ambulating without assistance.  She will follow-up with PCP in 1 week to repeat CBC with differential and CMP.  Also, follow-up with PCP for medication management.  Due to frequent crises, it is recommended that patient start Endari.  [name] will be prescribed by PCP.  Sickle cell anemia: Hemoglobin 7.7 prior to discharge.  No blood transfusion warranted.  Will repeat CBC in primary care in 1 week.  Hypokalemia: Recommended that patient continue a potassium rich diet.  No medications warranted at this time.  We will continue to follow.  Repeat potassium level in 1 week.  Thrombocytosis: Patient has chronic thrombocytosis.  Continue aspirin 81 mg daily.  Patient was therefore discharged home today in a hemodynamically stable condition.   [name] will follow-up with PCP within 1 week of this discharge. [name] was counseled extensively about  nonpharmacologic means of pain management, patient verbalized understanding and was appreciative of  the care received during this admission.   We discussed the need for good hydration, monitoring of hydration status, avoidance of heat, cold, stress, and infection  triggers.  Patient was reminded of the need to seek medical attention immediately if any symptom of bleeding, anemia, or infection occurs.  Discharge Exam: Vitals:   01/13/21 0732 01/13/21 0924  BP:  130/76  Pulse:  71  Resp: 16 18  Temp:  97.6 F (36.4 C)  SpO2: 98% 96%   Vitals:   01/13/21 0442 01/13/21 0732 01/13/21 0924 01/13/21 1108  BP: 107/74  130/76   Pulse: 63  71   Resp: 14 16 18    Temp: 98.1 F (36.7 C)  97.6 F (36.4 C)   TempSrc: Oral  Oral   SpO2: 96% 98% 96%   Weight:    71.9 kg  Height:    5\' 7"  (1.702 m)    General appearance : Awake, alert, not in any distress. Speech Clear. Not toxic looking HEENT: Atraumatic and Normocephalic, pupils equally reactive to light and accomodation Neck: Supple, no JVD. No cervical lymphadenopathy.  Chest: Good air entry bilaterally, no added sounds  CVS: S1 S2 regular, no murmurs.  Abdomen: Bowel sounds present, Non tender and not distended with no guarding, rigidity or rebound. Extremities: B/L Lower Ext shows no edema, both legs are warm to touch Neurology: Awake alert, and oriented X 3, CN II-XII intact, Non focal Skin: No Rash  Discharge Instructions  Discharge Instructions    Discharge patient   Complete by: As directed    Discharge disposition: 01-Home or Self Care   Discharge patient date: 01/13/2021     Allergies as of 01/13/2021      Reactions   Keflex [cephalexin] Swelling   Face swelling      Medication List    TAKE these medications   aspirin EC 81 MG tablet Take 1 tablet (81 mg total) by mouth daily.   cetirizine 10 MG tablet Commonly known as: ZYRTEC Take 10 mg by mouth daily as needed for allergies.   dorzolamide-timolol 22.3-6.8 MG/ML ophthalmic solution Commonly known as: COSOPT Place 1 drop into the left eye 2 (two) times daily.   ergocalciferol 1.25 MG (50000 UT) capsule Commonly known as: VITAMIN D2 Take 1 capsule (50,000 Units total) by mouth once a week. What changed: when to take  this   folic acid 1 MG tablet Commonly known as: FOLVITE Take 1 tablet (1 mg total) by mouth daily.   ibuprofen 800 MG tablet Commonly known as: ADVIL Take 1 tablet (800 mg total) by mouth every 8 (eight) hours as needed. What changed: reasons to take this   norethindrone 0.35 MG tablet Commonly known as: MICRONOR Take 1 tablet by mouth daily.   oxycodone 5 MG capsule Commonly known as: OXY-IR Take 1 capsule (5 mg total) by mouth every 4 (four) hours as needed. What changed: reasons to take this   prednisoLONE acetate 1 % ophthalmic suspension Commonly known as: PRED FORTE Place 1 drop into the left eye 4 (four) times daily.   prenatal multivitamin Tabs tablet Take 1 tablet by mouth daily at 12 noon.   Prolensa 0.07 % Soln Generic drug: Bromfenac Sodium Place 1 drop into the left eye 4 (four) times daily.   promethazine 12.5 MG tablet Commonly known as: PHENERGAN Take 1 tablet (12.5 mg total) by mouth every 6 (six) hours as needed for nausea  or vomiting. What changed: You were already taking a medication with the same name, and this prescription was added. Make sure you understand how and when to take each.   promethazine 12.5 MG tablet Commonly known as: PHENERGAN Take 1 tablet (12.5 mg total) by mouth every 6 (six) hours as needed for nausea or vomiting. What changed: Another medication with the same name was added. Make sure you understand how and when to take each.       The results of significant diagnostics from this hospitalization (including imaging, microbiology, ancillary and laboratory) are listed below for reference.    Significant Diagnostic Studies: No results found.  Microbiology: Recent Results (from the past 240 hour(s))  SARS CORONAVIRUS 2 (TAT 6-24 HRS) Nasopharyngeal Nasopharyngeal Swab     Status: None   Collection Time: 01/12/21  5:27 PM   Specimen: Nasopharyngeal Swab  Result Value Ref Range Status   SARS Coronavirus 2 NEGATIVE NEGATIVE  Final    Comment: (NOTE) SARS-CoV-2 target nucleic acids are NOT DETECTED.  The SARS-CoV-2 RNA is generally detectable in upper and lower respiratory specimens during the acute phase of infection. Negative results do not preclude SARS-CoV-2 infection, do not rule out co-infections with other pathogens, and should not be used as the sole basis for treatment or other patient management decisions. Negative results must be combined with clinical observations, patient history, and epidemiological information. The expected result is Negative.  Fact Sheet for Patients: SugarRoll.be  Fact Sheet for Healthcare Providers: https://www.woods-mathews.com/  This test is not yet approved or cleared by the [place] FDA and  has been authorized for detection and/or diagnosis of SARS-CoV-2 by FDA under an Emergency Use Authorization (EUA). This EUA will remain  in effect (meaning this test can be used) for the duration of the COVID-19 declaration under Se ction 564(b)(1) of the Act, 21 U.S.C. section 360bbb-3(b)(1), unless the authorization is terminated or revoked sooner.  Performed at [facility], [place] [address] [place], [place] [address]      Labs: Basic Metabolic Panel: Recent Labs  Lab 01/12/21 1109 01/13/21 0534  NA 142 138  K 3.4* 3.2*  CL 109 109  CO2 23 22  GLUCOSE 97 77  BUN 10 7  CREATININE 0.44 0.56  CALCIUM 8.9 8.2*   Liver Function Tests: Recent Labs  Lab 01/12/21 1109 01/13/21 0534  AST 23 19  ALT 18 16  ALKPHOS 78 62  BILITOT 5.8* 3.8*  PROT 7.8 6.2*  ALBUMIN 4.7 3.8   No results for input(s): LIPASE, AMYLASE in the last 168 hours. No results for input(s): AMMONIA in the last 168 hours. CBC: Recent Labs  Lab 01/12/21 1109 01/12/21 1741  WBC 8.8 11.9*  NEUTROABS 5.1  --   HGB 8.6* 7.7*  HCT 26.4* 23.8*  MCV 92.6 93.3  PLT 679* 601*   Cardiac Enzymes: No results for input(s): CKTOTAL, CKMB,  CKMBINDEX, TROPONINI in the last 168 hours. BNP: Invalid input(s): POCBNP CBG: No results for input(s): GLUCAP in the last 168 hours.  Time coordinating discharge: 30 minutes  Signed:  [name]  APRN, MSN, FNP-C Patient [facility] [address] [place], [place] [address] [phone]  [facility] 01/13/2021, 5:39 PM

## 2021-01-14 LAB — URINE CULTURE: Culture: NO GROWTH

## 2021-01-15 ENCOUNTER — Other Ambulatory Visit: Payer: Self-pay | Admitting: Family Medicine

## 2021-01-16 ENCOUNTER — Other Ambulatory Visit: Payer: Self-pay

## 2021-01-16 ENCOUNTER — Other Ambulatory Visit: Payer: BC Managed Care – PPO

## 2021-01-16 DIAGNOSIS — E559 Vitamin D deficiency, unspecified: Secondary | ICD-10-CM

## 2021-01-16 DIAGNOSIS — D571 Sickle-cell disease without crisis: Secondary | ICD-10-CM

## 2021-01-16 DIAGNOSIS — D5700 Hb-SS disease with crisis, unspecified: Secondary | ICD-10-CM

## 2021-01-16 NOTE — Progress Notes (Unsigned)
Patient no-showed today's appointment; provider notified for review of record.

## 2021-01-17 LAB — CBC WITH DIFFERENTIAL/PLATELET
Basophils Absolute: 0.1 10*3/uL (ref 0.0–0.2)
Basos: 1 %
EOS (ABSOLUTE): 0.2 10*3/uL (ref 0.0–0.4)
Eos: 1 %
Hematocrit: 23 % — ABNORMAL LOW (ref 34.0–46.6)
Hemoglobin: 7.8 g/dL — ABNORMAL LOW (ref 11.1–15.9)
Immature Grans (Abs): 0.2 10*3/uL — ABNORMAL HIGH (ref 0.0–0.1)
Immature Granulocytes: 1 %
Lymphocytes Absolute: 5.4 10*3/uL — ABNORMAL HIGH (ref 0.7–3.1)
Lymphs: 40 %
MCH: 30.7 pg (ref 26.6–33.0)
MCHC: 33.9 g/dL (ref 31.5–35.7)
MCV: 91 fL (ref 79–97)
Monocytes Absolute: 1.1 10*3/uL — ABNORMAL HIGH (ref 0.1–0.9)
Monocytes: 8 %
NRBC: 5 % — ABNORMAL HIGH (ref 0–0)
Neutrophils Absolute: 6.7 10*3/uL (ref 1.4–7.0)
Neutrophils: 49 %
Platelets: 585 10*3/uL — ABNORMAL HIGH (ref 150–450)
RBC: 2.54 x10E6/uL — CL (ref 3.77–5.28)
RDW: 18.5 % — ABNORMAL HIGH (ref 11.7–15.4)
WBC: 13.6 10*3/uL — ABNORMAL HIGH (ref 3.4–10.8)

## 2021-01-17 LAB — BASIC METABOLIC PANEL
BUN/Creatinine Ratio: 9 (ref 9–23)
BUN: 5 mg/dL — ABNORMAL LOW (ref 6–20)
CO2: 23 mmol/L (ref 20–29)
Calcium: 8.9 mg/dL (ref 8.7–10.2)
Chloride: 106 mmol/L (ref 96–106)
Creatinine, Ser: 0.54 mg/dL — ABNORMAL LOW (ref 0.57–1.00)
Glucose: 88 mg/dL (ref 65–99)
Potassium: 3.9 mmol/L (ref 3.5–5.2)
Sodium: 143 mmol/L (ref 134–144)
eGFR: 124 mL/min/{1.73_m2} (ref >59)

## 2021-01-21 ENCOUNTER — Other Ambulatory Visit: Payer: Self-pay | Admitting: Family Medicine

## 2021-02-16 ENCOUNTER — Encounter: Payer: Self-pay | Admitting: Family Medicine

## 2021-02-16 NOTE — Telephone Encounter (Signed)
Pt wants to know if she can have a work note to return to on next Tuesday, instead of next Monday since she has an appt with you Tuesday, so will not have to take off whole  for her appt. Please advise

## 2021-02-24 ENCOUNTER — Ambulatory Visit: Payer: BC Managed Care – PPO | Admitting: Family Medicine

## 2021-03-02 ENCOUNTER — Other Ambulatory Visit: Payer: Self-pay | Admitting: Family Medicine

## 2021-03-02 MED ORDER — TRIAMCINOLONE ACETONIDE 0.5 % EX OINT: 1.0000 "application " | TOPICAL_OINTMENT | Freq: Two times a day (BID) | CUTANEOUS | 3 refills | Status: AC

## 2021-03-02 NOTE — Progress Notes (Signed)
Meds ordered this encounter  Medications  . triamcinolone ointment (KENALOG) 0.5 %    Sig: Apply 1 application topically 2 (two) times daily.    Dispense:  30 g    Refill:  3    Order Specific Question:   Supervising Provider    Answer:   Tresa Garter [[id number]]     [name]  APRN, MSN, FNP-C Patient [facility] [address] [place], [place] [address] [phone]

## 2021-03-04 NOTE — Progress Notes (Shared)
[facility] Note  03/09/2021     CHIEF COMPLAINT Patient presents for No chief complaint on file.   HISTORY OF PRESENT ILLNESS: Krista [name] is a 35 y.o. female who presents to the clinic today for:  Pt feels like vision has improved, her next appt with Dr. Manuella Ghazi is 12.17.21  Referring physician: DeMarco, [place] OD [place], [place] [address]   HISTORICAL INFORMATION:   Selected notes from the [facility] Referral from Dr. [place] DeMarco for macular edema OS LEE: 11.18.20, BCVA OD: 20/20 OS: 20/30+2 PMH: sickle cell disease, chronic pain, heart murmer, hx of retinal vasculitits   CURRENT MEDICATIONS: Current Outpatient Medications (Ophthalmic Drugs)  Medication Sig  . Bromfenac Sodium (PROLENSA) 0.07 % SOLN Place 1 drop into the left eye 4 (four) times daily.  . dorzolamide-timolol (COSOPT) 22.3-6.8 MG/ML ophthalmic solution Place 1 drop into the left eye 2 (two) times daily.  . prednisoLONE acetate (PRED FORTE) 1 % ophthalmic suspension Place 1 drop into the left eye 4 (four) times daily.   No current facility-administered medications for this visit. (Ophthalmic Drugs)   Current Outpatient Medications (Other)  Medication Sig  . aspirin EC 81 MG tablet Take 1 tablet (81 mg total) by mouth daily.  . cetirizine (ZYRTEC) 10 MG tablet Take daily as needed  . ergocalciferol (VITAMIN D2) 1.25 MG (50000 UT) capsule Take 1 capsule (50,000 Units total) by mouth once a week. (Patient taking differently: Take 50,000 Units by mouth every Monday.)  . folic acid (FOLVITE) 1 MG tablet Take 1 tablet (1 mg total) by mouth daily.  Marland Kitchen ibuprofen (ADVIL) 800 MG tablet Take 1 tablet (800 mg total) by mouth every 8 (eight) hours as needed. (Patient taking differently: Take 800 mg by mouth every 8 (eight) hours as needed for moderate pain.)  . L-glutamine (ENDARI) 5 g PACK Powder Packet Take 5 g by mouth 2 (two) times daily.  .  norethindrone (MICRONOR) 0.35 MG tablet Take 1 tablet by mouth daily.  Marland Kitchen oxycodone (OXY-IR) 5 MG capsule Take 1 capsule (5 mg total) by mouth every 4 (four) hours as needed. (Patient taking differently: Take 5 mg by mouth every 4 (four) hours as needed for pain.)  . Prenatal Vit-Fe Fumarate-FA (PRENATAL MULTIVITAMIN) TABS tablet Take 1 tablet by mouth daily at 12 noon.  . promethazine (PHENERGAN) 12.5 MG tablet Take 1 tablet (12.5 mg total) by mouth every 6 (six) hours as needed for nausea or vomiting.  . promethazine (PHENERGAN) 12.5 MG tablet Take 1 tablet (12.5 mg total) by mouth every 6 (six) hours as needed for nausea or vomiting.  . triamcinolone ointment (KENALOG) 0.5 % Apply 1 application topically 2 (two) times daily.   No current facility-administered medications for this visit. (Other)      REVIEW OF SYSTEMS:    ALLERGIES Allergies  Allergen Reactions  . Keflex [Cephalexin] Swelling    Face swelling    PAST MEDICAL HISTORY Past Medical History:  Diagnosis Date  . Chronic pain    with sickle cell   . Eczema   . Heart murmur    07-13-2019  per pt since childhood,  has not ever had a echo done ,  asymptomatic  . History of blood transfusion    x3   . Missed ab   . Sickle cell anemia ([place])    followed by pcp---  last crisis 12-14-2018  . Vitamin D deficiency   . Wears glasses  Past Surgical History:  Procedure Laterality Date  . EYE SURGERY Left 11/12/2019  . LAPAROSCOPIC CHOLECYSTECTOMY  1997    FAMILY HISTORY Family History  Problem Relation Age of Onset  . Diabetes Father   . Hypertension Father   . Heart disease Brother   . Cancer Maternal Grandmother   . Heart disease Brother   . Breast cancer Paternal Aunt   . Cancer Paternal Uncle     SOCIAL HISTORY Social History   Tobacco Use  . Smoking status: Never Smoker  . Smokeless tobacco: Never Used  Vaping Use  . Vaping Use: Never used  Substance Use Topics  . Alcohol use: Not Currently     Comment: socially  . Drug use: Never         OPHTHALMIC EXAM:  Not recorded     IMAGING AND PROCEDURES  Imaging and Procedures for @TODAY @           ASSESSMENT/PLAN:    ICD-10-CM   1. 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 Retinal edema  H35.81   4. 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 Ocular hypertension of left eye  H40.052   6. Hypertensive retinopathy of both eyes  H35.033    1,2. Peripheral chorioretinal inflammation and vasculitis OS  - s/p IVO #1 w/ Dr. Manuella Ghazi (03.23.21), s/p IVO #2 (10.05.21)  - had baby in August 2021  - pt reports vague history "vasculitis" OS -- first diagnosed in ? 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 Matilde Bash, Uveitis/Retina expert, who initiated extensive work up and advised laser PRP             - s/p PRP OS (01.25.21) -- good laser changes 360  - s/p IVO #1 (Dr. Manuella Ghazi, 03.23.21), s/p IVO #2 (10.05.21)  - cont PF and Prolensa qid OS  - IOP improved to 15 OS from 28 after starting Cosopt BID OS -- likely steroid response  - cont Cosopt BID OS  - f/u here in 6 months, DFE, OCT  - scheduled to see Dr. Manuella Ghazi Oct 03, 2020  3. Retinal edema  - OCT shows interval improvement in central CME/IRF,  but FA has no corresponding central hyperfluorescent leakage/staining--petaloid or otherwise  4. History of Sickle Cell SS disease  - no SS retinopathy  5. Ocular Hypertension OS  - possible steroid response  - IOP improved to 15 today from 28 -- started Cosopt BID OS  - continue Cosopt BID OS  Ophthalmic Meds Ordered this visit:  No orders of the defined types were placed in this encounter.     No follow-ups on file.  There are no Patient Instructions on file for this visit.  This document  serves as a record of services personally performed by Gardiner Sleeper, MD, PhD. It was created on their behalf by Leeann Must, [place], an ophthalmic technician. The creation of this record is the provider's dictation and/or activities during the visit.    Electronically signed by: Leeann Must, COA @TODAY @ 9:26 AM  Abbreviations: M myopia (nearsighted); A astigmatism; H hyperopia (farsighted); P presbyopia; Mrx spectacle prescription;  CTL contact lenses; OD right eye; OS left eye; OU both eyes  XT exotropia; ET esotropia; PEK punctate epithelial keratitis; PEE punctate epithelial erosions; DES dry eye syndrome; MGD meibomian gland dysfunction; ATs artificial tears; PFAT's preservative free artificial tears; [place] nuclear sclerotic cataract; PSC posterior subcapsular cataract; ERM epi-retinal membrane; PVD posterior vitreous detachment; RD retinal detachment; DM diabetes mellitus; DR diabetic retinopathy; NPDR non-proliferative diabetic retinopathy; PDR proliferative diabetic retinopathy; CSME clinically significant macular edema; DME diabetic macular edema; dbh dot blot hemorrhages; CWS cotton wool spot; POAG primary open angle glaucoma; C/D cup-to-disc ratio; HVF humphrey visual field; GVF goldmann visual field; OCT optical coherence tomography; IOP intraocular pressure; BRVO Branch retinal vein occlusion; CRVO central retinal vein occlusion; CRAO central retinal artery occlusion; BRAO branch retinal  artery occlusion; RT retinal tear; SB scleral buckle; PPV pars plana vitrectomy; VH Vitreous hemorrhage; PRP panretinal laser photocoagulation; IVK intravitreal kenalog; VMT vitreomacular traction; MH Macular hole;  NVD neovascularization of the disc; NVE neovascularization elsewhere; AREDS age related eye disease study; ARMD age related macular degeneration; POAG primary open angle glaucoma; EBMD epithelial/anterior basement membrane dystrophy; ACIOL anterior chamber intraocular lens; IOL intraocular lens; PCIOL posterior chamber intraocular lens; Phaco/IOL phacoemulsification with intraocular lens placement; [place] photorefractive keratectomy; LASIK laser assisted in situ keratomileusis; HTN hypertension; DM diabetes mellitus; COPD chronic obstructive pulmonary disease

## 2021-03-07 ENCOUNTER — Other Ambulatory Visit: Payer: Self-pay | Admitting: Family Medicine

## 2021-03-09 ENCOUNTER — Encounter (INDEPENDENT_AMBULATORY_CARE_PROVIDER_SITE_OTHER): Payer: BC Managed Care – PPO | Admitting: Ophthalmology

## 2021-03-09 DIAGNOSIS — H30032 Focal chorioretinal inflammation, peripheral, left eye: Secondary | ICD-10-CM

## 2021-03-09 DIAGNOSIS — H35062 Retinal vasculitis, left eye: Secondary | ICD-10-CM

## 2021-03-09 DIAGNOSIS — D571 Sickle-cell disease without crisis: Secondary | ICD-10-CM

## 2021-03-09 DIAGNOSIS — H40052 Ocular hypertension, left eye: Secondary | ICD-10-CM

## 2021-03-09 DIAGNOSIS — H3581 Retinal edema: Secondary | ICD-10-CM

## 2021-03-09 DIAGNOSIS — H35033 Hypertensive retinopathy, bilateral: Secondary | ICD-10-CM

## 2021-04-03 ENCOUNTER — Encounter (INDEPENDENT_AMBULATORY_CARE_PROVIDER_SITE_OTHER): Payer: BC Managed Care – PPO | Admitting: Ophthalmology

## 2021-04-07 NOTE — Progress Notes (Signed)
[facility] Note  04/08/2021     CHIEF COMPLAINT Patient presents for Retina Follow Up   HISTORY OF PRESENT ILLNESS: Krista [name] is a 35 y.o. female who presents to the clinic today for:  HPI     Retina Follow Up   Patient presents with  Other.  In left eye.  Duration of 6 months.  Since onset it is stable.  I, the attending physician,  performed the HPI with the patient and updated documentation appropriately.        Comments   Pt here for 6 mo ret follow up vasculitis. Pt states vision is fine, no changes. She tried to update specs but neither glasses or contacts worked for her w/ new rx. She is still wearing old specs.       Last edited by Bernarda Caffey, MD on 04/09/2021 12:12 AM.     Pt has been receiving injections with Manuella Ghazi, but she missed her last appt due to her  work schedule, pt states her sickle cell has been acting up since she had her last baby in August 2021  Referring physician: DeMarco, [place], [place] [id number]   HISTORICAL INFORMATION:   Selected notes from the [facility] Referral from Dr. [place] DeMarco for macular edema OS LEE: 11.18.20, BCVA OD: 20/20 OS: 20/30+2 PMH: sickle cell disease, chronic pain, heart murmer, hx of retinal vasculitits   CURRENT MEDICATIONS: Current Outpatient Medications (Ophthalmic Drugs)  Medication Sig   Bromfenac Sodium (PROLENSA) 0.07 % SOLN Place 1 drop into the left eye 4 (four) times daily.   dorzolamide-timolol (COSOPT) 22.3-6.8 MG/ML ophthalmic solution Place 1 drop into the left eye 2 (two) times daily.   prednisoLONE acetate (PRED FORTE) 1 % ophthalmic suspension Place 1 drop into the left eye 4 (four) times daily.   No current facility-administered medications for this visit. (Ophthalmic Drugs)   Current Outpatient Medications (Other)  Medication Sig   ASPIRIN LOW DOSE 81 MG EC tablet TAKE 1 TABLET BY MOUTH EVERY DAY   cetirizine  (ZYRTEC) 10 MG tablet Take daily as needed   ergocalciferol (VITAMIN D2) 1.25 MG (50000 UT) capsule Take 1 capsule (50,000 Units total) by mouth once a week. (Patient taking differently: Take 50,000 Units by mouth every Monday.)   folic acid (FOLVITE) 1 MG tablet Take 1 tablet (1 mg total) by mouth daily.   ibuprofen (ADVIL) 800 MG tablet Take 1 tablet (800 mg total) by mouth every 8 (eight) hours as needed. (Patient taking differently: Take 800 mg by mouth every 8 (eight) hours as needed for moderate pain.)   L-glutamine (ENDARI) 5 g PACK Powder Packet Take 5 g by mouth 2 (two) times daily.   norethindrone (MICRONOR) 0.35 MG tablet Take 1 tablet by mouth daily.   oxycodone (OXY-IR) 5 MG capsule Take 1 capsule (5 mg total) by mouth every 4 (four) hours as needed. (Patient taking differently: Take 5 mg by mouth every 4 (four) hours as needed for pain.)   Prenatal Vit-Fe Fumarate-FA (PRENATAL MULTIVITAMIN) TABS tablet Take 1 tablet by mouth daily at 12 noon.   promethazine (PHENERGAN) 12.5 MG tablet Take 1 tablet (12.5 mg total) by mouth every 6 (six) hours as needed for nausea or vomiting.   promethazine (PHENERGAN) 12.5 MG tablet Take 1 tablet (12.5 mg total) by mouth every 6 (six) hours as needed for nausea or vomiting.   triamcinolone ointment (KENALOG) 0.5 % Apply 1  application topically 2 (two) times daily.   No current facility-administered medications for this visit. (Other)      REVIEW OF SYSTEMS:     ALLERGIES Allergies  Allergen Reactions   Keflex [Cephalexin] Swelling    Face swelling    PAST MEDICAL HISTORY Past Medical History:  Diagnosis Date   Chronic pain    with sickle cell    Eczema    Heart murmur    07-13-2019  per pt since childhood,  has not ever had a echo done ,  asymptomatic   History of blood transfusion    x3    Missed ab    Sickle cell anemia ([facility])    followed by pcp---  last crisis 12-14-2018   Vitamin D deficiency    Wears glasses    Past  Surgical History:  Procedure Laterality Date   EYE SURGERY Left 11/12/2019   LAPAROSCOPIC CHOLECYSTECTOMY  1997    FAMILY HISTORY Family History  Problem Relation Age of Onset   Diabetes Father    Hypertension Father    Heart disease Brother    Cancer Maternal Grandmother    Heart disease Brother    Breast cancer Paternal Aunt    Cancer Paternal Uncle     SOCIAL HISTORY Social History   Tobacco Use   Smoking status: Never   Smokeless tobacco: Never  Vaping Use   Vaping Use: Never used  Substance Use Topics   Alcohol use: Not Currently    Comment: socially   Drug use: Never         OPHTHALMIC EXAM:  Base Eye Exam     Visual Acuity (Snellen - Linear)       Right Left   Dist cc 20/20 -1 20/50   Dist ph cc  20/40 -2         Tonometry (Tonopen, 2:49 PM)       Right Left   Pressure 15 14         Pupils       Dark Light Shape React APD   Right 3 2 Round Brisk None   Left 3 2 Round Brisk None         Visual Fields (Counting fingers)       Left Right     Full   Restrictions Partial outer superior temporal, inferior temporal deficiencies          Extraocular Movement       Right Left    Full, Ortho Full, Ortho         Neuro/Psych     Oriented x3: Yes   Mood/Affect: Normal         Dilation     Both eyes: 1.0% Mydriacyl, 2.5% Phenylephrine @ 2:49 PM           Slit Lamp and Fundus Exam     Slit Lamp Exam       Right Left   Lids/Lashes Normal Normal   Conjunctiva/Sclera Mild Melanosis Mild Melanosis   Cornea Clear, mild tear film debris mild tear film debris, trace PEE   Anterior Chamber Deep and quiet, no cell or flare deep and clear, no cell or flare   Iris Round and reactive Round and dilated   Lens Clear Trace Posterior subcapsular cataract   Vitreous Mild Vitreous syneresis mild Vitreous syneresis, vitreous condensations, Ozurdex pellet inferiorly, Posterior vitreous detachment         Fundus Exam       Right  Left  Disc Pink and Sharp, mild PPA, Compact Pink and Sharp, 360 degrees of PPP, +fibrosis   C/D Ratio 0.3 0.2   Macula Flat, good foveal reflex, No heme or edema Blunted foveal reflex, +Cystic changes centrally -- persistent, mild Epiretinal membrane, RPE mottling and clumping   Vessels Tortuous, mild AV crossing changes, Copper wiring Vascular attenuation, Tortuous   Periphery Attached, no heme    Attached, focal CR scar at 0730 equator, good PRP laser changes -- peripheral 360           Refraction     Wearing Rx       Sphere Cylinder Axis   Right -1.50 +1.25 005   Left -1.50 +0.75 006    Type: SVL            IMAGING AND PROCEDURES  Imaging and Procedures for $RemoveBefore'@TODAY'[id number]$ @  OCT, Retina - OU - Both Eyes       Right Eye Quality was good. Central Foveal Thickness: 211. Progression has been stable. Findings include normal foveal contour, no IRF, no SRF, vitreomacular adhesion (Broad foveal depression).   Left Eye Quality was good. Central Foveal Thickness: 221. Progression has improved. Findings include intraretinal fluid, epiretinal membrane, outer retinal atrophy, abnormal foveal contour, no SRF (Persistent IRF nasal macula).   Notes *Images captured and stored on drive  Diagnosis / Impression: OD: NFP, no IRF/SRF OS: +CME -- Persistent IRF nasal macula   Abbreviations: NFP - Normal foveal profile. CME - cystoid macular edema. PED - pigment epithelial detachment. IRF - intraretinal fluid. SRF - subretinal fluid. EZ - ellipsoid zone. ERM - epiretinal membrane. ORA - outer retinal atrophy. ORT - outer retinal tubulation. SRHM - subretinal hyper-reflective material            ASSESSMENT/PLAN:    ICD-10-CM   1. 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 Retinal edema  H35.81 OCT, Retina - OU - Both Eyes    4. 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 Ocular  hypertension of left eye  H40.052       1,2. Peripheral chorioretinal inflammation and vasculitis OS  - s/p IVO #1 w/ Dr. Manuella Ghazi (03.23.21), IVO #2 (10.05.21), IVO #3 (12.17.21), IVO #4 (3.8.22)  - had baby in August 2021  - pt reports vague history "vasculitis" OS -- first diagnosed in ?[place] in 2016 -- doesn't think she saw a retina specialist  - states in 2016 had an episode of seeing spots then vision loss OS, told she had "vasculitis"  - +vitreous cell  - exam shows peripapillary atrophy and pigment deposition  - OCT OS with diffuse peripheral ellipsoid loss with central sparing + Persistent IRF nasal macula  - FA (12.02.20) shows mild, late perivascular leakage, peripheral midzone OS (very late appearing--likely increased signal if study were allowed to continue beyond 10 min); OD normal study  - BCVA stable at 20/40 OS, but 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 Matilde Bash, Uveitis/Retina expert, who initiated extensive work up and advised laser PRP             - s/p PRP OS (01.25.21) -- good laser changes 360  - s/p IVO #1 w/ Dr. Manuella Ghazi (03.23.21), IVO #2 (10.05.21), IVO #3 (12.17.21), IVO #4 (3.8.22)  - cont PF  and Prolensa qid OS  - IOP stable at 14 OS  - cont Cosopt BID OS  - pt plans on breastfeeding her baby for 1 year, then plans to switch to IMT for treatment of chorioretinal inflammation OS  - will release pt to Dr. Manuella Ghazi for continued management  - scheduled to see Dr. Manuella Ghazi April 21, 2021  - pt can f/u here prn per Dr. Manuella Ghazi -- we are happy to see Ms. Loe at anytime  3. Retinal edema  - OCT shows interval improvement in central CME/IRF, but FA has no corresponding central hyperfluorescent leakage/staining--petaloid or otherwise  4. History of Sickle Cell SS disease  - no SS retinopathy  5. Ocular Hypertension OS  - possible steroid response  - IOP 14 OS -- stably improved on Cosopt BID OS  - continue  cosopt BID OS  Ophthalmic Meds Ordered this visit:  No orders of the defined types were placed in this encounter.     Return if symptoms worsen or fail to improve.  There are no Patient Instructions on file for this visit.   Explained the diagnoses, plan, and follow up with the patient and they expressed understanding.  Patient expressed understanding of the importance of proper follow up care.   This document serves as a record of services personally performed by Gardiner Sleeper, MD, PhD. It was created on their behalf by Roselee Nova, COMT. The creation of this record is the provider's dictation and/or activities during the visit.  Electronically signed by: Roselee Nova, COMT 04/09/21 12:22 AM  This document serves as a record of services personally performed by Gardiner Sleeper, MD, PhD. It was created on their behalf by San Jetty. Owens Shark, OA an ophthalmic technician. The creation of this record is the provider's dictation and/or activities during the visit.    Electronically signed by: San Jetty. Owens Shark, [place] 06.22.2022 12:22 AM  Gardiner Sleeper, M.D., Ph.D. Diseases & Surgery of the Retina and Vitreous [facility]  I have reviewed the above documentation for accuracy and completeness, and I agree with the above. Gardiner Sleeper, M.D., Ph.D. 04/09/21 12:22 AM  Abbreviations: M myopia (nearsighted); A astigmatism; H hyperopia (farsighted); P presbyopia; Mrx spectacle prescription;  CTL contact lenses; OD right eye; OS left eye; OU both eyes  XT exotropia; ET esotropia; PEK punctate epithelial keratitis; PEE punctate epithelial erosions; DES dry eye syndrome; MGD meibomian gland dysfunction; ATs artificial tears; PFAT's preservative free artificial tears; [place] nuclear sclerotic cataract; PSC posterior subcapsular cataract; ERM epi-retinal membrane; PVD posterior vitreous detachment; RD retinal detachment; DM diabetes mellitus; DR diabetic retinopathy; NPDR  non-proliferative diabetic retinopathy; PDR proliferative diabetic retinopathy; CSME clinically significant macular edema; DME diabetic macular edema; dbh dot blot hemorrhages; CWS cotton wool spot; POAG primary open angle glaucoma; C/D cup-to-disc ratio; HVF humphrey visual field; GVF goldmann visual field; OCT optical coherence tomography; IOP intraocular pressure; BRVO Branch retinal vein occlusion; CRVO central retinal vein occlusion; CRAO central retinal artery occlusion; BRAO branch retinal artery occlusion; RT retinal tear; SB scleral buckle; PPV pars plana vitrectomy; VH Vitreous hemorrhage; PRP panretinal laser photocoagulation; IVK intravitreal kenalog; VMT vitreomacular traction; MH Macular hole;  NVD neovascularization of the disc; NVE neovascularization elsewhere; AREDS age related eye disease study; ARMD age related macular degeneration; POAG primary open angle glaucoma; EBMD epithelial/anterior basement membrane dystrophy; ACIOL anterior chamber intraocular lens; IOL intraocular lens; PCIOL posterior chamber intraocular lens; Phaco/IOL phacoemulsification with intraocular lens placement; PRK photorefractive keratectomy; LASIK laser  assisted in situ keratomileusis; HTN hypertension; DM diabetes mellitus; COPD chronic obstructive pulmonary disease

## 2021-04-08 ENCOUNTER — Ambulatory Visit (INDEPENDENT_AMBULATORY_CARE_PROVIDER_SITE_OTHER): Payer: BC Managed Care – PPO | Admitting: Ophthalmology

## 2021-04-08 ENCOUNTER — Encounter (INDEPENDENT_AMBULATORY_CARE_PROVIDER_SITE_OTHER): Payer: Self-pay | Admitting: Ophthalmology

## 2021-04-08 ENCOUNTER — Other Ambulatory Visit: Payer: Self-pay

## 2021-04-08 DIAGNOSIS — D571 Sickle-cell disease without crisis: Secondary | ICD-10-CM | POA: Diagnosis not present

## 2021-04-08 DIAGNOSIS — H35062 Retinal vasculitis, left eye: Secondary | ICD-10-CM | POA: Diagnosis not present

## 2021-04-08 DIAGNOSIS — H40052 Ocular hypertension, left eye: Secondary | ICD-10-CM

## 2021-04-08 DIAGNOSIS — H36 Retinal disorders in diseases classified elsewhere: Secondary | ICD-10-CM

## 2021-04-08 DIAGNOSIS — H30032 Focal chorioretinal inflammation, peripheral, left eye: Secondary | ICD-10-CM | POA: Diagnosis not present

## 2021-04-08 DIAGNOSIS — H3581 Retinal edema: Secondary | ICD-10-CM

## 2021-04-09 ENCOUNTER — Encounter (INDEPENDENT_AMBULATORY_CARE_PROVIDER_SITE_OTHER): Payer: Self-pay | Admitting: Ophthalmology

## 2021-04-14 ENCOUNTER — Encounter: Payer: Self-pay | Admitting: Family Medicine

## 2021-04-14 ENCOUNTER — Ambulatory Visit (INDEPENDENT_AMBULATORY_CARE_PROVIDER_SITE_OTHER): Payer: BC Managed Care – PPO | Admitting: Family Medicine

## 2021-04-14 ENCOUNTER — Other Ambulatory Visit: Payer: Self-pay

## 2021-04-14 VITALS — BP 106/68 | HR 95 | Temp 98.6°F | Ht 67.0 in | Wt 161.0 lb

## 2021-04-14 DIAGNOSIS — D571 Sickle-cell disease without crisis: Secondary | ICD-10-CM | POA: Diagnosis not present

## 2021-04-14 DIAGNOSIS — Z23 Encounter for immunization: Secondary | ICD-10-CM | POA: Diagnosis not present

## 2021-04-14 DIAGNOSIS — L309 Dermatitis, unspecified: Secondary | ICD-10-CM

## 2021-04-14 DIAGNOSIS — E559 Vitamin D deficiency, unspecified: Secondary | ICD-10-CM

## 2021-04-14 LAB — POCT URINALYSIS DIPSTICK
Bilirubin, UA: NEGATIVE
Blood, UA: NEGATIVE
Glucose, UA: NEGATIVE
Ketones, UA: NEGATIVE
Leukocytes, UA: NEGATIVE
Nitrite, UA: NEGATIVE
Protein, UA: POSITIVE — AB
Spec Grav, UA: 1.015 (ref 1.010–1.025)
Urobilinogen, UA: 0.2 E.U./dL
pH, UA: 5.5 (ref 5.0–8.0)

## 2021-04-14 MED ORDER — SARNA 0.5-0.5 % EX LOTN: 1.0000 "application " | TOPICAL_LOTION | CUTANEOUS | 0 refills | Status: DC | PRN

## 2021-04-14 NOTE — Patient Instructions (Signed)
Sickle Cell Anemia, Adult  Sickle cell anemia is a condition where your red blood cells are shaped like sickles. Red blood cells carry oxygen through the body. Sickle-shaped cells do not live as long as normal red blood cells. They also clump together and block blood from flowing through the blood vessels. This prevents the body from getting enough oxygen. Sickle cell anemia causes organ damage and pain. It alsoincreases the risk of infection. Follow these instructions at home: Medicines Take over-the-counter and prescription medicines only as told by your doctor. If you were prescribed an antibiotic medicine, take it as told by your doctor. Do not stop taking the antibiotic even if you start to feel better. If you develop a fever, do not take medicines to lower the fever right away. Tell your doctor about the fever. Managing pain, stiffness, and swelling Try these methods to help with pain: Use a heating pad. Take a warm bath. Distract yourself, such as by watching TV. Eating and drinking Drink enough fluid to keep your pee (urine) clear or pale yellow. Drink more in hot weather and during exercise. Limit or avoid alcohol. Eat a healthy diet. Eat plenty of fruits, vegetables, whole grains, and lean protein. Take vitamins and supplements as told by your doctor. Traveling When traveling, keep these with you: Your medical information. The names of your doctors. Your medicines. If you need to take an airplane, talk to your doctor first. Activity Rest often. Avoid exercises that make your heart beat much faster, such as jogging. General instructions Do not use products that have nicotine or tobacco, such as cigarettes and e-cigarettes. If you need help quitting, ask your doctor. Consider wearing a medical alert bracelet. Avoid being in high places (high altitudes), such as mountains. Avoid very hot or cold temperatures. Avoid places where the temperature changes a lot. Keep all follow-up  visits as told by your doctor. This is important. Contact a doctor if: A joint hurts. Your feet or hands hurt or swell. You feel tired (fatigued). Get help right away if: You have symptoms of infection. These include: Fever. Chills. Being very tired. Irritability. Poor eating. Throwing up (vomiting). You feel dizzy or faint. You have new stomach pain, especially on the left side. You have a an erection (priapism) that lasts more than 4 hours. You have numbness in your arms or legs. You have a hard time moving your arms or legs. You have trouble talking. You have pain that does not go away when you take medicine. You are short of breath. You are breathing fast. You have a long-term cough. You have pain in your chest. You have a bad headache. You have a stiff neck. Your stomach looks bloated even though you did not eat much. Your skin is pale. You suddenly cannot see well. Summary Sickle cell anemia is a condition where your red blood cells are shaped like sickles. Follow your doctor's advice on ways to manage pain, food to eat, activities to do, and steps to take for safe travel. Get medical help right away if you have any signs of infection, such as a fever. This information is not intended to replace advice given to you by your health care provider. Make sure you discuss any questions you have with your healthcare provider. Document Revised: 02/28/2020 Document Reviewed: 02/28/2020 Elsevier Patient Education  [place].

## 2021-04-14 NOTE — Progress Notes (Signed)
[facility]   Established Patient Office Visit  Subjective:  Patient ID: Krista [name], female    DOB: 20-Aug-1986  Age: 35 y.o. MRN: [id number]  CC:  Chief Complaint  Patient presents with   Follow-up    3 month follow up; ankle/ joint pain after sitting for a well.     HPI Krista [name] is a very pleasant 35 year old female with a medical history significant for sickle cell disease, and vitamin D deficiency, and anemia of chronic disease presents for follow-up of chronic conditions.  Today, patient's primary complaint is bilateral ankle pain over the past several months.  Patient previously had very well-controlled sickle cell disease with infrequent pain crisis.  Since delivering her second baby 10 months ago, patient has had several sickle cell pain crises.  Patient was started on disease modifying agent Endari twice daily.  Patient has not been hospitalized for sickle cell crisis over the past 3 months.  However, she continues to have chronic pain that is relieved by over-the-counter ibuprofen and Tylenol.  Patient says that she only takes oxycodone when pain is unbearable.  Today, patient is not having pain. She is also complaining of a rash to hands bilaterally.  Rash has been waxing and waning over the past several months.  At some points, rash appears dry and flaky.  Generalized rash is somewhat improved with triamcinolone ointment.  Patient states that problem tends to reappear periodically.  She has not identified any provocative factors concerning Rash.  She has not used any new soaps, perfumes, lotions, or laundry detergent.  Past Medical History:  Diagnosis Date   Chronic pain    with sickle cell    Eczema    Heart murmur    07-13-2019  per pt since childhood,  has not ever had a echo done ,  asymptomatic   History of blood transfusion    x3    Missed ab    Sickle cell anemia (HCC)    followed by pcp---  last crisis  12-14-2018   Vitamin D deficiency    Wears glasses     Past Surgical History:  Procedure Laterality Date   EYE SURGERY Left 11/12/2019   LAPAROSCOPIC CHOLECYSTECTOMY  1997    Family History  Problem Relation Age of Onset   Diabetes Father    Hypertension Father    Heart disease Brother    Cancer Maternal Grandmother    Heart disease Brother    Breast cancer Paternal Aunt    Cancer Paternal Uncle     Social History   Socioeconomic History   Marital status: Married    Spouse name: n/a   Number of children: 0   Years of education: 16+   Highest education level: Not on file  Occupational History   Occupation: TEACHER    Employer: [employer]  Tobacco Use   Smoking status: Never   Smokeless tobacco: Never  Vaping Use   Vaping Use: Never used  Substance and Sexual Activity   Alcohol use: Not Currently    Comment: socially   Drug use: Never   Sexual activity: Yes    Birth control/protection: None  Other Topics Concern   Not on file  Social History Narrative   ** Merged History Encounter **       Lives alone.  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   Social Determinants of Health  Financial Resource Strain: Not on file  Food Insecurity: Not on file  Transportation Needs: Not on file  Physical Activity: Not on file  Stress: Not on file  Social Connections: Not on file  Intimate Partner Violence: Not on file    Outpatient Medications Prior to Visit  Medication Sig Dispense Refill   ASPIRIN LOW DOSE 81 MG EC tablet TAKE 1 TABLET BY MOUTH EVERY DAY 90 tablet 1   Bromfenac Sodium (PROLENSA) 0.07 % SOLN Place 1 drop into the left eye 4 (four) times daily. 6 mL 3   cetirizine (ZYRTEC) 10 MG tablet Take daily as needed 90 tablet 0   dorzolamide-timolol (COSOPT) 22.3-6.8 MG/ML ophthalmic solution Place 1 drop into the left eye 2 (two) times daily. 10 mL 5   ergocalciferol  (VITAMIN D2) 1.25 MG (50000 UT) capsule Take 1 capsule (50,000 Units total) by mouth once a week. (Patient taking differently: Take 50,000 Units by mouth every Monday.) 12 capsule 1   folic acid (FOLVITE) 1 MG tablet Take 1 tablet (1 mg total) by mouth daily. 90 tablet 3   ibuprofen (ADVIL) 800 MG tablet Take 1 tablet (800 mg total) by mouth every 8 (eight) hours as needed. (Patient taking differently: Take 800 mg by mouth every 8 (eight) hours as needed for moderate pain.) 60 tablet 1   oxycodone (OXY-IR) 5 MG capsule Take 1 capsule (5 mg total) by mouth every 4 (four) hours as needed. (Patient taking differently: Take 5 mg by mouth every 4 (four) hours as needed for pain.) 30 capsule 0   Prenatal Vit-Fe Fumarate-FA (PRENATAL MULTIVITAMIN) TABS tablet Take 1 tablet by mouth daily at 12 noon.     triamcinolone ointment (KENALOG) 0.5 % Apply 1 application topically 2 (two) times daily. 30 g 3   L-glutamine (ENDARI) 5 g PACK Powder Packet Take 5 g by mouth 2 (two) times daily. 180 each 1   levonorgestrel (MIRENA, 52 MG,) 20 MCG/DAY IUD Mirena 20 mcg/24 hours (7 yrs) 52 mg intrauterine device  Take 1 device by intrauterine route.     norethindrone (MICRONOR) 0.35 MG tablet Take 1 tablet by mouth daily.     prednisoLONE acetate (PRED FORTE) 1 % ophthalmic suspension Place 1 drop into the left eye 4 (four) times daily. 15 mL 5   promethazine (PHENERGAN) 12.5 MG tablet Take 1 tablet (12.5 mg total) by mouth every 6 (six) hours as needed for nausea or vomiting. 30 tablet 0   promethazine (PHENERGAN) 12.5 MG tablet Take 1 tablet (12.5 mg total) by mouth every 6 (six) hours as needed for nausea or vomiting. 30 tablet 0   No facility-administered medications prior to visit.    Allergies  Allergen Reactions   Keflex [Cephalexin] Swelling    Face swelling    ROS Review of Systems  Constitutional: Negative.   HENT: Negative.    Eyes: Negative.   Respiratory: Negative.    Gastrointestinal: Negative.    Endocrine: Negative.   Genitourinary: Negative.   Musculoskeletal:  Positive for arthralgias.  Skin:  Positive for rash.  Allergic/Immunologic: Negative.   Neurological: Negative.   Psychiatric/Behavioral: Negative.       Objective:    Physical Exam Constitutional:      Appearance: Normal appearance.  Eyes:     Pupils: Pupils are equal, round, and reactive to light.  Cardiovascular:     Rate and Rhythm: Normal rate and regular rhythm.     Pulses: Normal pulses.  Abdominal:     General:  Abdomen is flat. Bowel sounds are normal.  Musculoskeletal:        General: Normal range of motion.  Skin:    General: Skin is dry.  Neurological:     General: No focal deficit present.     Mental Status: She is alert. Mental status is at baseline.  Psychiatric:        Mood and Affect: Mood normal.        Behavior: Behavior normal.        Thought Content: Thought content normal.        Judgment: Judgment normal.    BP 106/68 (BP Location: Left Arm, Patient Position: Sitting)   Pulse 95   Temp 98.6 F (37 C)   Ht 5' 7"  (1.702 m)   Wt 161 lb 0.2 oz (73 kg)   LMP 03/28/2021   SpO2 98%   Breastfeeding Yes   BMI 25.22 kg/m  Wt Readings from Last 3 Encounters:  04/14/21 161 lb 0.2 oz (73 kg)  01/13/21 158 lb 9.6 oz (71.9 kg)  01/06/21 158 lb 9.6 oz (71.9 kg)     Health Maintenance Due  Topic Date Due   Pneumococcal Vaccine 23-32 Years old (1 - PCV) Never done   COVID-19 Vaccine (3 - Pfizer risk series) 08/23/2020   PAP SMEAR-Modifier  01/15/2021    There are no preventive care reminders to display for this patient.  Lab Results  Component Value Date   TSH 2.21 03/24/2017   Lab Results  Component Value Date   WBC 13.6 (H) 01/16/2021   HGB 7.8 (L) 01/16/2021   HCT 23.0 (L) 01/16/2021   MCV 91 01/16/2021   PLT 585 (H) 01/16/2021   Lab Results  Component Value Date   NA 143 01/16/2021   K 3.9 01/16/2021   CO2 23 01/16/2021   GLUCOSE 88 01/16/2021   BUN 5 (L)  01/16/2021   CREATININE 0.54 (L) 01/16/2021   BILITOT 3.8 (H) 01/13/2021   ALKPHOS 62 01/13/2021   AST 19 01/13/2021   ALT 16 01/13/2021   PROT 6.2 (L) 01/13/2021   ALBUMIN 3.8 01/13/2021   CALCIUM 8.9 01/16/2021   ANIONGAP 7 01/13/2021   EGFR 124 01/16/2021   Lab Results  Component Value Date   CHOL 112 (L) 08/12/2015   Lab Results  Component Value Date   HDL 26 (L) 08/12/2015   Lab Results  Component Value Date   LDLCALC 67 08/12/2015   Lab Results  Component Value Date   TRIG 95 08/12/2015   Lab Results  Component Value Date   CHOLHDL 4.3 08/12/2015   Lab Results  Component Value Date   HGBA1C <4.0 03/24/2017      Assessment & Plan:   Problem List Items Addressed This Visit   None Visit Diagnoses     Sickle-cell disease without crisis ([place])    -  Primary   Relevant Orders   Urinalysis Dipstick (Completed)   Sickle Cell Panel (Completed)   Need for vaccination       Relevant Orders   Pneumococcal polysaccharide vaccine 23-valent greater than or equal to 2yo subcutaneous/IM (Completed)   Dermatitis due to unknown cause       Relevant Medications   camphor-menthol (SARNA) lotion   Other Relevant Orders   Ambulatory referral to Dermatology   Vitamin D deficiency       Relevant Orders   Sickle Cell Panel (Completed)      1. Sickle-cell disease without crisis ([place]) Overall, Ms. Grimley  sickle cell disease is well controlled.  We will continue folic acid and Endari as prescribed.  No other medication changes are warranted at this time.  Recommended patient hydrate with 64 ounces of fluid, especially during summer months.  - Urinalysis Dipstick - Sickle Cell Panel  2. Need for vaccination Patient received pneumococcal 23 vaccination without complication.  She is up-to-date with all other vaccinations including COVID-19. - Pneumococcal polysaccharide vaccine 23-valent greater than or equal to 2yo subcutaneous/IM  3. Dermatitis due to unknown  cause Patient continues to have periodic rash to hands.  At this juncture, a dermatology referral is warranted for further work-up and evaluation. - Ambulatory referral to Dermatology - camphor-menthol [facility]) lotion; Apply 1 application topically as needed for itching.  Dispense: 222 mL; Refill: 0  4. Vitamin D deficiency  - Sickle Cell Panel   Follow-up: Return in about 3 months (around 07/15/2021) for sickle cell anemia.    [name]  APRN, MSN, FNP-C Patient [facility] [address] [place], [place] [address] [phone]

## 2021-04-15 LAB — CMP14+CBC/D/PLT+FER+RETIC+V...
ALT: 21 IU/L (ref 0–32)
AST: 18 IU/L (ref 0–40)
Albumin/Globulin Ratio: 2.1 (ref 1.2–2.2)
Albumin: 4.9 g/dL — ABNORMAL HIGH (ref 3.8–4.8)
Alkaline Phosphatase: 111 IU/L (ref 44–121)
BUN/Creatinine Ratio: 8 — ABNORMAL LOW (ref 9–23)
BUN: 5 mg/dL — ABNORMAL LOW (ref 6–20)
Basophils Absolute: 0.2 10*3/uL (ref 0.0–0.2)
Basos: 1 %
Bilirubin Total: 2.3 mg/dL — ABNORMAL HIGH (ref 0.0–1.2)
CO2: 23 mmol/L (ref 20–29)
Calcium: 9.7 mg/dL (ref 8.7–10.2)
Chloride: 105 mmol/L (ref 96–106)
Creatinine, Ser: 0.66 mg/dL (ref 0.57–1.00)
EOS (ABSOLUTE): 0.2 10*3/uL (ref 0.0–0.4)
Eos: 1 %
Ferritin: 335 ng/mL — ABNORMAL HIGH (ref 15–150)
Globulin, Total: 2.3 g/dL (ref 1.5–4.5)
Glucose: 69 mg/dL (ref 65–99)
Hematocrit: 26.5 % — ABNORMAL LOW (ref 34.0–46.6)
Hemoglobin: 8.7 g/dL — ABNORMAL LOW (ref 11.1–15.9)
Immature Grans (Abs): 0.2 10*3/uL — ABNORMAL HIGH (ref 0.0–0.1)
Immature Granulocytes: 1 %
Lymphocytes Absolute: 4 10*3/uL — ABNORMAL HIGH (ref 0.7–3.1)
Lymphs: 28 %
MCH: 30.6 pg (ref 26.6–33.0)
MCHC: 32.8 g/dL (ref 31.5–35.7)
MCV: 93 fL (ref 79–97)
Monocytes Absolute: 1.1 10*3/uL — ABNORMAL HIGH (ref 0.1–0.9)
Monocytes: 8 %
NRBC: 4 % — ABNORMAL HIGH (ref 0–0)
Neutrophils Absolute: 8.7 10*3/uL — ABNORMAL HIGH (ref 1.4–7.0)
Neutrophils: 61 %
Platelets: 776 10*3/uL — ABNORMAL HIGH (ref 150–450)
Potassium: 4.5 mmol/L (ref 3.5–5.2)
RBC: 2.84 x10E6/uL — ABNORMAL LOW (ref 3.77–5.28)
RDW: 16.5 % — ABNORMAL HIGH (ref 11.7–15.4)
Retic Ct Pct: 17.6 % — ABNORMAL HIGH (ref 0.6–2.6)
Sodium: 141 mmol/L (ref 134–144)
Total Protein: 7.2 g/dL (ref 6.0–8.5)
Vit D, 25-Hydroxy: 41.6 ng/mL (ref 30.0–100.0)
WBC: 14.3 10*3/uL — ABNORMAL HIGH (ref 3.4–10.8)
eGFR: 118 mL/min/{1.73_m2} (ref >59)

## 2021-04-23 ENCOUNTER — Telehealth: Payer: Self-pay | Admitting: Family Medicine

## 2021-04-23 NOTE — Telephone Encounter (Signed)
Ms. Skalicky is a 35 year old female with a medical history significant for sickle cell disease and vitamin D deficiency.  Patient was evaluated in clinic on 04/14/2021.  At that time, labs were obtained.  All laboratory values are consistent with patient's baseline.  We will repeat labs in around 6 months.  Advised patient to continue folic acid and Endari.  Recommend hydrating with 64 ounces of fluid per day.  Advised patient to follow-up at sickle cell day clinic in the event of acute sickle cell pain crisis.  Otherwise, follow-up as scheduled.  [name]  APRN, MSN, FNP-C Patient [facility] [address] [place], [place] [address] [phone]

## 2021-04-24 NOTE — Telephone Encounter (Signed)
Left voicemail advised to call back with any questions or concerns.

## 2021-05-16 ENCOUNTER — Other Ambulatory Visit: Payer: Self-pay | Admitting: Family Medicine

## 2021-05-16 DIAGNOSIS — E559 Vitamin D deficiency, unspecified: Secondary | ICD-10-CM

## 2021-05-28 ENCOUNTER — Other Ambulatory Visit: Payer: Self-pay | Admitting: Family Medicine

## 2021-06-30 ENCOUNTER — Telehealth: Payer: Self-pay

## 2021-06-30 ENCOUNTER — Other Ambulatory Visit: Payer: Self-pay

## 2021-06-30 DIAGNOSIS — D571 Sickle-cell disease without crisis: Secondary | ICD-10-CM

## 2021-06-30 MED ORDER — FOLIC ACID 1 MG PO TABS: 1.0000 mg | ORAL_TABLET | Freq: Every day | ORAL | 0 refills | Status: DC

## 2021-06-30 NOTE — Telephone Encounter (Signed)
Rx was sent  

## 2021-06-30 NOTE — Telephone Encounter (Signed)
Folic acid  [facility] [place]

## 2021-07-14 ENCOUNTER — Encounter: Payer: Self-pay | Admitting: Family Medicine

## 2021-07-14 ENCOUNTER — Other Ambulatory Visit: Payer: Self-pay

## 2021-07-14 ENCOUNTER — Ambulatory Visit (INDEPENDENT_AMBULATORY_CARE_PROVIDER_SITE_OTHER): Payer: BC Managed Care – PPO | Admitting: Family Medicine

## 2021-07-14 VITALS — BP 115/76 | HR 70 | Temp 99.9°F | Ht 67.0 in | Wt 164.8 lb

## 2021-07-14 DIAGNOSIS — D571 Sickle-cell disease without crisis: Secondary | ICD-10-CM

## 2021-07-14 DIAGNOSIS — Z23 Encounter for immunization: Secondary | ICD-10-CM | POA: Diagnosis not present

## 2021-07-14 DIAGNOSIS — S99922A Unspecified injury of left foot, initial encounter: Secondary | ICD-10-CM | POA: Diagnosis not present

## 2021-07-14 DIAGNOSIS — E559 Vitamin D deficiency, unspecified: Secondary | ICD-10-CM

## 2021-07-14 LAB — POCT URINALYSIS DIP (CLINITEK)
Bilirubin, UA: NEGATIVE
Blood, UA: NEGATIVE
Glucose, UA: NEGATIVE mg/dL
Ketones, POC UA: NEGATIVE mg/dL
Leukocytes, UA: NEGATIVE
Nitrite, UA: NEGATIVE
POC PROTEIN,UA: NEGATIVE
Spec Grav, UA: 1.015 (ref 1.010–1.025)
Urobilinogen, UA: 0.2 E.U./dL
pH, UA: 5.5 (ref 5.0–8.0)

## 2021-07-14 NOTE — Progress Notes (Signed)
Patient [facility]   Established Patient Office Visit  Subjective:  Patient ID: Krista [name], female    DOB: Nov 15, 1985  Age: 35 y.o. MRN: [id number]  CC:  Chief Complaint  Patient presents with   Follow-up    Pt is here today for a follow up visit for her sickle cell anemia. Pt states she has been having back and neck pain when she is sleeping. Pt states that she hurt her left second toe about two months ago at the daycare when she was opening up the gate. She went to urgent care to get it looked and they said it was just swollen but nothing was broken or fractured. Pt states that her toe still look a little swollen but no pain unless she hits it on something.    HPI Krista [name] is a 35 year old female with a medical history significant for sickle cell disease presents for follow-up of chronic conditions.  Patient states that lately she has been having pain primarily to upper back and neck, this mostly occurs during sleep.  She awakens with increased neck pain, which typically dissipates throughout the day and improves with ibuprofen and Tylenol.  She occasionally has to take oxycodone. Patient is also complaining of left second toe pain.  She states that she injured her toe on a gait about 2 months ago.  She had this problem evaluated at urgent care, who said it was not fractured and to continue to ice and take ibuprofen.  Patient continues to complain of left second toe swelling, is typically worsened by wearing shoes. Patient has a history of sickle cell disease, generally well controlled.  Patient has infrequent sickle cell pain crises.  However, she has had some chronic pain primarily to her neck.  She is not having any pain at this time.  She denies shortness of breath, chest pain, urinary symptoms, nausea, vomiting, or diarrhea.  Past Medical History:  Diagnosis Date   Chronic pain    with sickle cell    Eczema    Heart murmur     07-13-2019  per pt since childhood,  has not ever had a echo done ,  asymptomatic   History of blood transfusion    x3    Missed ab    Sickle cell anemia (HCC)    followed by pcp---  last crisis 12-14-2018   Vitamin D deficiency    Wears glasses     Past Surgical History:  Procedure Laterality Date   EYE SURGERY Left 11/12/2019   LAPAROSCOPIC CHOLECYSTECTOMY  1997    Family History  Problem Relation Age of Onset   Diabetes Father    Hypertension Father    Heart disease Brother    Cancer Maternal Grandmother    Heart disease Brother    Breast cancer Paternal Aunt    Cancer Paternal Uncle     Social History   Socioeconomic History   Marital status: Married    Spouse name: n/a   Number of children: 0   Years of education: 16+   Highest education level: Not on file  Occupational History   Occupation: TEACHER    Employer: [employer]  Tobacco Use   Smoking status: Never   Smokeless tobacco: Never  Vaping Use   Vaping Use: Never used  Substance and Sexual Activity   Alcohol use: Not Currently    Comment: socially   Drug use: Never   Sexual activity: Yes  Birth control/protection: None  Other Topics Concern   Not on file  Social History Narrative   ** Merged History Encounter **       Lives alone.  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   Social Determinants of Health   Financial Resource Strain: Not on file  Food Insecurity: Not on file  Transportation Needs: Not on file  Physical Activity: Not on file  Stress: Not on file  Social Connections: Not on file  Intimate Partner Violence: Not on file    Outpatient Medications Prior to Visit  Medication Sig Dispense Refill   ASPIRIN LOW DOSE 81 MG EC tablet TAKE 1 TABLET BY MOUTH EVERY DAY 90 tablet 1   Bromfenac Sodium (PROLENSA) 0.07 % SOLN Place 1 drop into the left eye 4 (four) times daily. 6 mL 3   camphor-menthol  (SARNA) lotion Apply 1 application topically as needed for itching. 222 mL 0   dorzolamide-timolol (COSOPT) 22.3-6.8 MG/ML ophthalmic solution Place 1 drop into the left eye 2 (two) times daily. 10 mL 5   EQ ALLERGY RELIEF, CETIRIZINE, 10 MG tablet TAKE 1 TABLET BY MOUTH ONCE DAILY AS NEEDED 90 tablet 0   folic acid (FOLVITE) 1 MG tablet Take 1 tablet (1 mg total) by mouth daily. 90 tablet 0   ibuprofen (ADVIL) 800 MG tablet Take 1 tablet (800 mg total) by mouth every 8 (eight) hours as needed. (Patient taking differently: Take 800 mg by mouth every 8 (eight) hours as needed for moderate pain.) 60 tablet 1   levonorgestrel (MIRENA, 52 MG,) 20 MCG/DAY IUD Mirena 20 mcg/24 hours (7 yrs) 52 mg intrauterine device  Take 1 device by intrauterine route.     oxycodone (OXY-IR) 5 MG capsule Take 1 capsule (5 mg total) by mouth every 4 (four) hours as needed. (Patient taking differently: Take 5 mg by mouth every 4 (four) hours as needed for pain.) 30 capsule 0   prednisoLONE acetate (PRED FORTE) 1 % ophthalmic suspension SMARTSIG:In Eye(s)     Prenatal Vit-Fe Fumarate-FA (PRENATAL MULTIVITAMIN) TABS tablet Take 1 tablet by mouth daily at 12 noon.     triamcinolone ointment (KENALOG) 0.5 % Apply 1 application topically 2 (two) times daily. 30 g 3   Vitamin D, Ergocalciferol, (DRISDOL) 1.25 MG (50000 UNIT) CAPS capsule TAKE 1 CAPSULE BY MOUTH ONE TIME PER WEEK 12 capsule 1   Bromfenac Sodium (PROLENSA) 0.07 % SOLN Place 1 drop into the left eye daily.     L-glutamine (ENDARI) 5 g PACK Powder Packet Take 5 g by mouth 2 (two) times daily. 180 each 1   levonorgestrel (MIRENA, 52 MG,) 20 MCG/DAY IUD Mirena 20 mcg/24 hours (8 yrs) 52 mg intrauterine device  Take 1 device by intrauterine route.     Prenatal Vit-Fe Fumarate-FA (PRENATAL VITAMIN) 27-0.8 MG TABS Prenatal Vitamin     No facility-administered medications prior to visit.    Allergies  Allergen Reactions   Keflex [Cephalexin] Swelling    Face  swelling    ROS Review of Systems  Constitutional: Negative.   HENT: Negative.    Eyes: Negative.   Respiratory: Negative.    Cardiovascular: Negative.   Gastrointestinal: Negative.   Endocrine: Negative.   Genitourinary: Negative.   Musculoskeletal:  Positive for arthralgias.  Skin: Negative.   Neurological: Negative.   Hematological: Negative.   Psychiatric/Behavioral: Negative.       Objective:    Physical Exam Constitutional:  Appearance: Normal appearance.  Eyes:     Pupils: Pupils are equal, round, and reactive to light.  Cardiovascular:     Rate and Rhythm: Normal rate and regular rhythm.     Pulses: Normal pulses.  Pulmonary:     Effort: Pulmonary effort is normal.     Breath sounds: Normal breath sounds.  Abdominal:     General: Bowel sounds are normal.  Skin:    General: Skin is warm.  Neurological:     General: No focal deficit present.     Mental Status: She is alert. Mental status is at baseline.  Psychiatric:        Mood and Affect: Mood normal.        Behavior: Behavior normal.        Thought Content: Thought content normal.        Judgment: Judgment normal.    BP 115/76   Pulse 70   Temp 99.9 F (37.7 C)   Ht 5' 7"  (1.702 m)   Wt 164 lb 12.8 oz (74.8 kg)   SpO2 100%   BMI 25.81 kg/m  Wt Readings from Last 3 Encounters:  07/14/21 164 lb 12.8 oz (74.8 kg)  04/14/21 161 lb 0.2 oz (73 kg)  01/13/21 158 lb 9.6 oz (71.9 kg)     Health Maintenance Due  Topic Date Due   COVID-19 Vaccine (3 - Pfizer risk series) 08/23/2020   PAP SMEAR-Modifier  01/15/2021    There are no preventive care reminders to display for this patient.  Lab Results  Component Value Date   TSH 2.21 03/24/2017   Lab Results  Component Value Date   WBC 14.3 (H) 04/14/2021   HGB 8.7 (L) 04/14/2021   HCT 26.5 (L) 04/14/2021   MCV 93 04/14/2021   PLT 776 (H) 04/14/2021   Lab Results  Component Value Date   NA 141 04/14/2021   K 4.5 04/14/2021   CO2 23  04/14/2021   GLUCOSE 69 04/14/2021   BUN 5 (L) 04/14/2021   CREATININE 0.66 04/14/2021   BILITOT 2.3 (H) 04/14/2021   ALKPHOS 111 04/14/2021   AST 18 04/14/2021   ALT 21 04/14/2021   PROT 7.2 04/14/2021   ALBUMIN 4.9 (H) 04/14/2021   CALCIUM 9.7 04/14/2021   ANIONGAP 7 01/13/2021   EGFR 118 04/14/2021   Lab Results  Component Value Date   CHOL 112 (L) 08/12/2015   Lab Results  Component Value Date   HDL 26 (L) 08/12/2015   Lab Results  Component Value Date   LDLCALC 67 08/12/2015   Lab Results  Component Value Date   TRIG 95 08/12/2015   Lab Results  Component Value Date   CHOLHDL 4.3 08/12/2015   Lab Results  Component Value Date   HGBA1C <4.0 03/24/2017      Assessment & Plan:   Problem List Items Addressed This Visit   None Visit Diagnoses     Sickle-cell disease without crisis ([place])    -  Primary   Relevant Orders   POCT URINALYSIS DIP (CLINITEK) (Completed)   Need for prophylactic vaccination and inoculation against influenza       Relevant Orders   Flu Vaccine QUAD 36+ mos IM (Fluarix, Fluzone & Afluria Quad PF      1. Sickle-cell disease without crisis ([place])  - POCT URINALYSIS DIP (CLINITEK) - Sickle Cell Panel  2. Need for prophylactic vaccination and inoculation against influenza  - Flu Vaccine QUAD 36+ mos IM (Fluarix, Fluzone &  Afluria Quad PF  3. Toe injury, left, initial encounter  - DG Foot Complete Left; Future  4. Vitamin D deficiency  - Sickle Cell Panel    Follow-up: Return in about 6 months (around 01/11/2022) for sickle cell anemia.    [name]  APRN, MSN, FNP-C Patient [facility] [address] [place], [place] [address] [phone]

## 2021-07-15 ENCOUNTER — Telehealth: Payer: Self-pay | Admitting: Family Medicine

## 2021-07-15 LAB — CMP14+CBC/D/PLT+FER+RETIC+V...
ALT: 14 IU/L (ref 0–32)
AST: 17 IU/L (ref 0–40)
Albumin/Globulin Ratio: 2.1 (ref 1.2–2.2)
Albumin: 5.1 g/dL — ABNORMAL HIGH (ref 3.8–4.8)
Alkaline Phosphatase: 82 IU/L (ref 44–121)
BUN/Creatinine Ratio: 15 (ref 9–23)
BUN: 8 mg/dL (ref 6–20)
Basophils Absolute: 0.2 10*3/uL (ref 0.0–0.2)
Basos: 1 %
Bilirubin Total: 2.6 mg/dL — ABNORMAL HIGH (ref 0.0–1.2)
CO2: 22 mmol/L (ref 20–29)
Calcium: 10 mg/dL (ref 8.7–10.2)
Chloride: 104 mmol/L (ref 96–106)
Creatinine, Ser: 0.55 mg/dL — ABNORMAL LOW (ref 0.57–1.00)
EOS (ABSOLUTE): 0.2 10*3/uL (ref 0.0–0.4)
Eos: 1 %
Ferritin: 328 ng/mL — ABNORMAL HIGH (ref 15–150)
Globulin, Total: 2.4 g/dL (ref 1.5–4.5)
Glucose: 79 mg/dL (ref 70–99)
Hematocrit: 27.7 % — ABNORMAL LOW (ref 34.0–46.6)
Hemoglobin: 9.3 g/dL — ABNORMAL LOW (ref 11.1–15.9)
Immature Grans (Abs): 0.1 10*3/uL (ref 0.0–0.1)
Immature Granulocytes: 1 %
Lymphocytes Absolute: 4.2 10*3/uL — ABNORMAL HIGH (ref 0.7–3.1)
Lymphs: 32 %
MCH: 31.2 pg (ref 26.6–33.0)
MCHC: 33.6 g/dL (ref 31.5–35.7)
MCV: 93 fL (ref 79–97)
Monocytes Absolute: 0.8 10*3/uL (ref 0.1–0.9)
Monocytes: 6 %
NRBC: 4 % — ABNORMAL HIGH (ref 0–0)
Neutrophils Absolute: 7.8 10*3/uL — ABNORMAL HIGH (ref 1.4–7.0)
Neutrophils: 59 %
Platelets: 726 10*3/uL — ABNORMAL HIGH (ref 150–450)
Potassium: 5 mmol/L (ref 3.5–5.2)
RBC: 2.98 x10E6/uL — ABNORMAL LOW (ref 3.77–5.28)
RDW: 15.2 % (ref 11.7–15.4)
Retic Ct Pct: 16.2 % — ABNORMAL HIGH (ref 0.6–2.6)
Sodium: 140 mmol/L (ref 134–144)
Total Protein: 7.5 g/dL (ref 6.0–8.5)
Vit D, 25-Hydroxy: 43 ng/mL (ref 30.0–100.0)
WBC: 13.1 10*3/uL — ABNORMAL HIGH (ref 3.4–10.8)
eGFR: 123 mL/min/{1.73_m2} (ref >59)

## 2021-07-15 NOTE — Telephone Encounter (Signed)
Patient notified of results, verbally understood. No questions or concerns,

## 2021-07-15 NOTE — Telephone Encounter (Signed)
Please inform patient that laboratory values are largely consistent with her baseline.  Recommend that she continue folic acid daily.  Also, avoid stressors that trigger sickle cell pain crisis.  Recommend that she drinks 32-64 ounces of water per day.  Also, low impact exercise as tolerated.  Recommend ibuprofen and Tylenol as directed for pain control.  Remind patient that sickle cell day infusion clinic is available for acute sickle cell pain crisis Monday through Friday 8 AM to 4:30 PM.  [name]  APRN, MSN, FNP-C Patient [facility] [address] [place], [place] [address] [phone]

## 2021-07-24 ENCOUNTER — Other Ambulatory Visit: Payer: Self-pay | Admitting: Family Medicine

## 2021-07-24 ENCOUNTER — Telehealth: Payer: Self-pay

## 2021-07-24 DIAGNOSIS — D5700 Hb-SS disease with crisis, unspecified: Secondary | ICD-10-CM

## 2021-07-24 DIAGNOSIS — D571 Sickle-cell disease without crisis: Secondary | ICD-10-CM

## 2021-07-24 MED ORDER — OXYCODONE HCL 5 MG PO CAPS: 5.0000 mg | ORAL_CAPSULE | ORAL | 0 refills | Status: DC | PRN

## 2021-07-24 MED ORDER — IBUPROFEN 800 MG PO TABS: 800.0000 mg | ORAL_TABLET | Freq: Three times a day (TID) | ORAL | 1 refills | Status: AC | PRN

## 2021-07-24 NOTE — Progress Notes (Signed)
Reviewed PDMP substance reporting system prior to prescribing opiate medications. No inconsistencies noted.    Meds ordered this encounter  Medications   oxycodone (OXY-IR) 5 MG capsule    Sig: Take 1 capsule (5 mg total) by mouth every 4 (four) hours as needed.    Dispense:  30 capsule    Refill:  0    Order Specific Question:   Supervising Provider    Answer:   Tresa Garter [[id number]]   ibuprofen (ADVIL) 800 MG tablet    Sig: Take 1 tablet (800 mg total) by mouth every 8 (eight) hours as needed.    Dispense:  60 tablet    Refill:  1    Order Specific Question:   Supervising Provider    Answer:   Tresa Garter [[id number]]     [name]  APRN, MSN, FNP-C Patient [facility] [address] [place], [place] [address] [phone]

## 2021-07-24 NOTE — Telephone Encounter (Signed)
Ibuprofen Oxycodone   [place] [facility]

## 2021-07-30 ENCOUNTER — Ambulatory Visit (HOSPITAL_COMMUNITY)
Admission: RE | Admit: 2021-07-30 | Discharge: 2021-07-30 | Disposition: A | Discharge status: Home / Self Care | Payer: BC Managed Care – PPO | Source: Ambulatory Visit | Attending: Family Medicine | Admitting: Family Medicine

## 2021-07-30 ENCOUNTER — Other Ambulatory Visit: Payer: Self-pay

## 2021-07-30 DIAGNOSIS — S99922A Unspecified injury of left foot, initial encounter: Secondary | ICD-10-CM | POA: Diagnosis present

## 2021-08-05 ENCOUNTER — Telehealth: Payer: Self-pay | Admitting: Family Medicine

## 2021-08-05 NOTE — Telephone Encounter (Signed)
Left foot x-ray reviewed, no fractures or dislocation noted.  No interventions at this time.  If patient continues to have issues, have her follow-up with our office for orthopedic referral.   [name]  APRN, MSN, FNP-C Patient [facility] [address] [place], [place] [address] [phone]

## 2021-08-17 ENCOUNTER — Telehealth (HOSPITAL_COMMUNITY): Payer: Self-pay | Admitting: *Deleted

## 2021-08-17 NOTE — Telephone Encounter (Signed)
Patient called requesting to come to the day hospital for sickle cell pain crisis. The day hospital is at capacity and unable to accept additional patients. Patient advised to continue to manage pain at home with home medications and to call back to the day hospital in the morning. Patient should go to the ED if unable to wait until tomorrow. Patient advised and expresses an understanding.

## 2021-08-25 ENCOUNTER — Ambulatory Visit (INDEPENDENT_AMBULATORY_CARE_PROVIDER_SITE_OTHER): Payer: BC Managed Care – PPO | Admitting: Family Medicine

## 2021-08-25 ENCOUNTER — Non-Acute Institutional Stay (HOSPITAL_COMMUNITY)
Admission: RE | Admit: 2021-08-25 | Discharge: 2021-08-25 | Disposition: A | Discharge status: Home / Self Care | Payer: BC Managed Care – PPO | Source: Ambulatory Visit | Attending: Internal Medicine | Admitting: Internal Medicine

## 2021-08-25 ENCOUNTER — Ambulatory Visit (HOSPITAL_COMMUNITY)
Admission: RE | Admit: 2021-08-25 | Discharge: 2021-08-25 | Disposition: A | Discharge status: Home / Self Care | Payer: BC Managed Care – PPO | Source: Ambulatory Visit | Attending: Family Medicine | Admitting: Family Medicine

## 2021-08-25 ENCOUNTER — Other Ambulatory Visit: Payer: Self-pay

## 2021-08-25 ENCOUNTER — Encounter: Payer: Self-pay | Admitting: Family Medicine

## 2021-08-25 VITALS — BP 115/66 | HR 84 | Temp 98.8°F | Ht 67.0 in | Wt 168.0 lb

## 2021-08-25 DIAGNOSIS — D5700 Hb-SS disease with crisis, unspecified: Secondary | ICD-10-CM | POA: Insufficient documentation

## 2021-08-25 DIAGNOSIS — E860 Dehydration: Secondary | ICD-10-CM

## 2021-08-25 DIAGNOSIS — R079 Chest pain, unspecified: Secondary | ICD-10-CM | POA: Diagnosis not present

## 2021-08-25 LAB — POCT URINALYSIS DIP (CLINITEK)
Bilirubin, UA: NEGATIVE
Blood, UA: NEGATIVE
Glucose, UA: NEGATIVE mg/dL
Ketones, POC UA: NEGATIVE mg/dL
Leukocytes, UA: NEGATIVE
Nitrite, UA: NEGATIVE
POC PROTEIN,UA: NEGATIVE
Spec Grav, UA: 1.015 (ref 1.010–1.025)
Urobilinogen, UA: 1 E.U./dL
pH, UA: 7.5 (ref 5.0–8.0)

## 2021-08-25 LAB — CBC WITH DIFFERENTIAL/PLATELET
Abs Immature Granulocytes: 0 10*3/uL (ref 0.00–0.07)
Basophils Absolute: 0.2 10*3/uL — ABNORMAL HIGH (ref 0.0–0.1)
Basophils Relative: 1 %
Eosinophils Absolute: 0.2 10*3/uL (ref 0.0–0.5)
Eosinophils Relative: 1 %
HCT: 27.6 % — ABNORMAL LOW (ref 36.0–46.0)
Hemoglobin: 9.2 g/dL — ABNORMAL LOW (ref 12.0–15.0)
Lymphocytes Relative: 33 %
Lymphs Abs: 5.1 10*3/uL — ABNORMAL HIGH (ref 0.7–4.0)
MCH: 30.8 pg (ref 26.0–34.0)
MCHC: 33.3 g/dL (ref 30.0–36.0)
MCV: 92.3 fL (ref 80.0–100.0)
Monocytes Absolute: 1.9 10*3/uL — ABNORMAL HIGH (ref 0.1–1.0)
Monocytes Relative: 12 %
Neutro Abs: 8.3 10*3/uL — ABNORMAL HIGH (ref 1.7–7.7)
Neutrophils Relative %: 53 %
Platelets: 917 10*3/uL (ref 150–400)
RBC: 2.99 MIL/uL — ABNORMAL LOW (ref 3.87–5.11)
RDW: 18 % — ABNORMAL HIGH (ref 11.5–15.5)
WBC: 15.6 10*3/uL — ABNORMAL HIGH (ref 4.0–10.5)
nRBC: 2.2 % — ABNORMAL HIGH (ref 0.0–0.2)

## 2021-08-25 LAB — COMPREHENSIVE METABOLIC PANEL
ALT: 20 U/L (ref 0–44)
AST: 18 U/L (ref 15–41)
Albumin: 4.9 g/dL (ref 3.5–5.0)
Alkaline Phosphatase: 77 U/L (ref 38–126)
Anion gap: 9 (ref 5–15)
BUN: 10 mg/dL (ref 6–20)
CO2: 24 mmol/L (ref 22–32)
Calcium: 9.4 mg/dL (ref 8.9–10.3)
Chloride: 109 mmol/L (ref 98–111)
Creatinine, Ser: 0.39 mg/dL — ABNORMAL LOW (ref 0.44–1.00)
GFR, Estimated: >60 mL/min (ref >60)
Glucose, Bld: 69 mg/dL — ABNORMAL LOW (ref 70–99)
Potassium: 4.3 mmol/L (ref 3.5–5.1)
Sodium: 142 mmol/L (ref 135–145)
Total Bilirubin: 2.7 mg/dL — ABNORMAL HIGH (ref 0.3–1.2)
Total Protein: 8.1 g/dL (ref 6.5–8.1)

## 2021-08-25 MED ORDER — KETOROLAC TROMETHAMINE 15 MG/ML IJ SOLN
15.0000 mg | Freq: Once | INTRAMUSCULAR | Status: AC
  Administered 2021-08-25: 15 mg via INTRAVENOUS
  Filled 2021-08-25: qty 1

## 2021-08-25 MED ORDER — SODIUM CHLORIDE 0.45 % IV BOLUS
1000.0000 mL | Freq: Once | INTRAVENOUS | Status: AC
  Administered 2021-08-25: 1000 mL via INTRAVENOUS

## 2021-08-25 NOTE — Progress Notes (Signed)
P [facility]   Established Patient Office Visit  Subjective:  Patient ID: Krista [name], female    DOB: [birth year]  Age: 35 y.o. MRN: [id number]  CC:  Chief Complaint  Patient presents with   Follow-up    Pt is here today for her follow up visit. Pt states that she had a stomach virus last week and it cause her body to go into a crisis. Pt would like to get checked out and get some labs done.    HPI Krista [name] is a 35 year old female with a medical history significant for sickle cell disease, chronic pain syndrome, vitamin D deficiency, and history of anemia of chronic disease presents for follow-up of chronic conditions.  Patient has complaints of worsening sickle cell pain over the past several months.  She states that she has had periodic chest pain and body pain that has been unrelieved by her home medications.  Patient is mostly opiate nave and generally takes ibuprofen to assist with her chronic pain.  Today, she is having pain primarily to central chest and lower extremities rated 5/10.  Patient has a history of pneumonia.  She currently denies headache, dizziness, blurred vision, urinary symptoms, nausea, vomiting, or diarrhea.  Patient continues to follow with her ophthalmologist closely due to history of retinopathy. Past Medical History:  Diagnosis Date   Chronic pain    with sickle cell    Eczema    Heart murmur    07-13-2019  per pt since childhood,  has not ever had a echo done ,  asymptomatic   History of blood transfusion    x3    Missed ab    Sickle cell anemia (HCC)    followed by pcp---  last crisis 12-14-2018   Vitamin D deficiency    Wears glasses     Past Surgical History:  Procedure Laterality Date   EYE SURGERY Left 11/12/2019   LAPAROSCOPIC CHOLECYSTECTOMY  1997    Family History  Problem Relation Age of Onset   Diabetes Father    Hypertension Father    Heart disease Brother    Cancer  Maternal Grandmother    Heart disease Brother    Breast cancer Paternal Aunt    Cancer Paternal Uncle     Social History   Socioeconomic History   Marital status: Married    Spouse name: n/a   Number of children: 0   Years of education: 16+   Highest education level: Not on file  Occupational History   Occupation: TEACHER    Employer: [employer]  Tobacco Use   Smoking status: Never   Smokeless tobacco: Never  Vaping Use   Vaping Use: Never used  Substance and Sexual Activity   Alcohol use: Not Currently    Comment: socially   Drug use: Never   Sexual activity: Yes    Birth control/protection: None  Other Topics Concern   Not on file  Social History Narrative   ** Merged History Encounter **       Lives alone.  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 Currently teaches at [employer].   Social Determinants of Health   Financial Resource Strain: Not on file  Food Insecurity: Not on file  Transportation Needs: Not on file  Physical Activity: Not on file  Stress: Not on file  Social Connections: Not on file  Intimate Partner Violence: Not on file  Outpatient Medications Prior to Visit  Medication Sig Dispense Refill   ASPIRIN LOW DOSE 81 MG EC tablet TAKE 1 TABLET BY MOUTH EVERY DAY 90 tablet 1   Bromfenac Sodium (PROLENSA) 0.07 % SOLN Place 1 drop into the left eye 4 (four) times daily. 6 mL 3   camphor-menthol (SARNA) lotion Apply 1 application topically as needed for itching. 222 mL 0   dorzolamide-timolol (COSOPT) 22.3-6.8 MG/ML ophthalmic solution Place 1 drop into the left eye 2 (two) times daily. 10 mL 5   EQ ALLERGY RELIEF, CETIRIZINE, 10 MG tablet TAKE 1 TABLET BY MOUTH ONCE DAILY AS NEEDED 90 tablet 0   folic acid (FOLVITE) 1 MG tablet Take 1 tablet (1 mg total) by mouth daily. 90 tablet 0   ibuprofen (ADVIL) 800 MG tablet Take 1 tablet (800 mg total) by mouth every 8 (eight) hours as needed. 60 tablet 1    levonorgestrel (MIRENA, 52 MG,) 20 MCG/DAY IUD Mirena 20 mcg/24 hours (7 yrs) 52 mg intrauterine device  Take 1 device by intrauterine route.     oxycodone (OXY-IR) 5 MG capsule Take 1 capsule (5 mg total) by mouth every 4 (four) hours as needed. 30 capsule 0   prednisoLONE acetate (PRED FORTE) 1 % ophthalmic suspension SMARTSIG:In Eye(s)     Prenatal Vit-Fe Fumarate-FA (PRENATAL MULTIVITAMIN) TABS tablet Take 1 tablet by mouth daily at 12 noon.     triamcinolone ointment (KENALOG) 0.5 % Apply 1 application topically 2 (two) times daily. 30 g 3   Vitamin D, Ergocalciferol, (DRISDOL) 1.25 MG (50000 UNIT) CAPS capsule TAKE 1 CAPSULE BY MOUTH ONE TIME PER WEEK 12 capsule 1   No facility-administered medications prior to visit.    Allergies  Allergen Reactions   Keflex [Cephalexin] Swelling    Face swelling    ROS Review of Systems  HENT: Negative.    Eyes: Negative.   Respiratory: Negative.    Cardiovascular:  Positive for chest pain.  Gastrointestinal: Negative.   Genitourinary: Negative.   Musculoskeletal:  Positive for back pain.     Objective:    Physical Exam Constitutional:      Appearance: Normal appearance.  Eyes:     Pupils: Pupils are equal, round, and reactive to light.  Cardiovascular:     Rate and Rhythm: Normal rate and regular rhythm.     Pulses: Normal pulses.  Pulmonary:     Effort: Pulmonary effort is normal.  Abdominal:     General: Bowel sounds are normal.  Skin:    General: Skin is warm.  Neurological:     Mental Status: She is alert. Mental status is at baseline.  Psychiatric:        Mood and Affect: Mood normal.        Behavior: Behavior normal.        Thought Content: Thought content normal.        Judgment: Judgment normal.    BP 115/66   Pulse 84   Temp 98.8 F (37.1 C)   Ht 5' 7"  (1.702 m)   Wt 168 lb (76.2 kg)   SpO2 96%   BMI 26.31 kg/m  Wt Readings from Last 3 Encounters:  08/25/21 168 lb (76.2 kg)  07/14/21 164 lb 12.8 oz  (74.8 kg)  04/14/21 161 lb 0.2 oz (73 kg)     Health Maintenance Due  Topic Date Due   COVID-19 Vaccine (3 - Pfizer risk series) 08/23/2020   PAP SMEAR-Modifier  01/15/2021    There are  no preventive care reminders to display for this patient.  Lab Results  Component Value Date   TSH 2.21 03/24/2017   Lab Results  Component Value Date   WBC 13.1 (H) 07/14/2021   HGB 9.3 (L) 07/14/2021   HCT 27.7 (L) 07/14/2021   MCV 93 07/14/2021   PLT 726 (H) 07/14/2021   Lab Results  Component Value Date   NA 140 07/14/2021   K 5.0 07/14/2021   CO2 22 07/14/2021   GLUCOSE 79 07/14/2021   BUN 8 07/14/2021   CREATININE 0.55 (L) 07/14/2021   BILITOT 2.6 (H) 07/14/2021   ALKPHOS 82 07/14/2021   AST 17 07/14/2021   ALT 14 07/14/2021   PROT 7.5 07/14/2021   ALBUMIN 5.1 (H) 07/14/2021   CALCIUM 10.0 07/14/2021   ANIONGAP 7 01/13/2021   EGFR 123 07/14/2021   Lab Results  Component Value Date   CHOL 112 (L) 08/12/2015   Lab Results  Component Value Date   HDL 26 (L) 08/12/2015   Lab Results  Component Value Date   LDLCALC 67 08/12/2015   Lab Results  Component Value Date   TRIG 95 08/12/2015   Lab Results  Component Value Date   CHOLHDL 4.3 08/12/2015   Lab Results  Component Value Date   HGBA1C <4.0 03/24/2017      Assessment & Plan:   Problem List Items Addressed This Visit       Other   Sickle cell crisis ([facility]) - Primary   Relevant Orders   POCT URINALYSIS DIP (CLINITEK) (Completed)  1. Sickle cell crisis (HCC)  Continue folic acid 1 mg daily to prevent aplastic bone marrow crises.    Pulmonary evaluation - Patient denies severe recurrent wheezes, shortness of breath with exercise, or persistent cough. If these symptoms develop, pulmonary function tests with spirometry will be ordered, and if abnormal, plan on referral to Pulmonology for further evaluation.  Cardiac - Routine screening for pulmonary hypertension is not recommended.       - POCT  URINALYSIS DIP (CLINITEK)  2. Chest pain in adult  - DG Chest 2 View; Future - EKG 12-Lead  3. Dehydration Patient will transition to sickle cell day infusion center for fluid bolus   Follow-up: Return in about 3 months (around 11/25/2021) for sickle cell anemia.    [name]  APRN, MSN, FNP-C [facility] [address] [name], [place] [address] [phone]

## 2021-08-25 NOTE — Patient Instructions (Signed)
Sickle Cell Anemia, Adult Sickle cell anemia is a condition where your red blood cells are shaped like sickles. Red blood cells carry oxygen through the body. Sickle-shaped cells do not live as long as normal red blood cells. They also clump together and block blood from flowing through the blood vessels. This prevents the body from getting enough oxygen. Sickle cell anemia causes organ damage and pain. It also increases the risk of infection. Follow these instructions at home: Medicines Take over-the-counter and prescription medicines only as told by your doctor. If you were prescribed an antibiotic medicine, take it as told by your doctor. Do not stop taking the antibiotic even if you start to feel better. If you develop a fever, do not take medicines to lower the fever right away. Tell your doctor about the fever. Managing pain, stiffness, and swelling Try these methods to help with pain: Use a heating pad. Take a warm bath. Distract yourself, such as by watching TV. Eating and drinking Drink enough fluid to keep your pee (urine) clear or pale yellow. Drink more in hot weather and during exercise. Limit or avoid alcohol. Eat a healthy diet. Eat plenty of fruits, vegetables, whole grains, and lean protein. Take vitamins and supplements as told by your doctor. Traveling When traveling, keep these with you: Your medical information. The names of your doctors. Your medicines. If you need to take an airplane, talk to your doctor first. Activity Rest often. Avoid exercises that make your heart beat much faster, such as jogging. General instructions Do not use products that have nicotine or tobacco, such as cigarettes and e-cigarettes. If you need help quitting, ask your doctor. Consider wearing a medical alert bracelet. Avoid being in high places (high altitudes), such as mountains. Avoid very hot or cold temperatures. Avoid places where the temperature changes a lot. Keep all follow-up  visits as told by your doctor. This is important. Contact a doctor if: A joint hurts. Your feet or hands hurt or swell. You feel tired (fatigued). Get help right away if: You have symptoms of infection. These include: Fever. Chills. Being very tired. Irritability. Poor eating. Throwing up (vomiting). You feel dizzy or faint. You have new stomach pain, especially on the left side. You have a an erection (priapism) that lasts more than 4 hours. You have numbness in your arms or legs. You have a hard time moving your arms or legs. You have trouble talking. You have pain that does not go away when you take medicine. You are short of breath. You are breathing fast. You have a long-term cough. You have pain in your chest. You have a bad headache. You have a stiff neck. Your stomach looks bloated even though you did not eat much. Your skin is pale. You suddenly cannot see well. Summary Sickle cell anemia is a condition where your red blood cells are shaped like sickles. Follow your doctor's advice on ways to manage pain, food to eat, activities to do, and steps to take for safe travel. Get medical help right away if you have any signs of infection, such as a fever. This information is not intended to replace advice given to you by your health care provider. Make sure you discuss any questions you have with your health care provider. Document Revised: 02/28/2020 Document Reviewed: 02/28/2020 Elsevier Patient Education  [place].

## 2021-08-25 NOTE — Progress Notes (Signed)
PATIENT CARE CENTER NOTE   Diagnosis: Dehydration (E86.0); Sickle cell crisis ([facility]) (D57.00)   Provider: [place], [place], FNP   Procedure: IV fluid hydration and lab work   Note:  Patient received 1 liter bolus of 0.45% NS via PIV. Bolus given over 1 hour. Toradol 15 mg IV also given. Patient's labs drawn (CBC w/diff and CMP). Patient tolerated well. Initially patient reported generalized pain rated 6/10. After bolus and Toradol patient's pain remained at 6/10. Vital signs stable. AVS offered but patient refused. Patient alert, oriented and ambulatory at discharge.

## 2021-09-03 ENCOUNTER — Telehealth: Payer: Self-pay

## 2021-09-03 DIAGNOSIS — E559 Vitamin D deficiency, unspecified: Secondary | ICD-10-CM

## 2021-09-03 MED ORDER — VITAMIN D (ERGOCALCIFEROL) 1.25 MG (50000 UNIT) PO CAPS: ORAL_CAPSULE | ORAL | 1 refills | Status: DC

## 2021-09-03 NOTE — Telephone Encounter (Signed)
Rx refill

## 2021-09-03 NOTE — Telephone Encounter (Signed)
[place]

## 2021-09-25 ENCOUNTER — Other Ambulatory Visit: Payer: Self-pay | Admitting: Family Medicine

## 2021-09-25 DIAGNOSIS — D571 Sickle-cell disease without crisis: Secondary | ICD-10-CM

## 2021-10-07 IMAGING — CR DG CHEST 2V
2 series · 2 of 2 positions shown · non-contrast
Comparison: 08/26/2020

CLINICAL DATA: Chest pain

EXAM:
CHEST - 2 VIEW

[w chest pa]
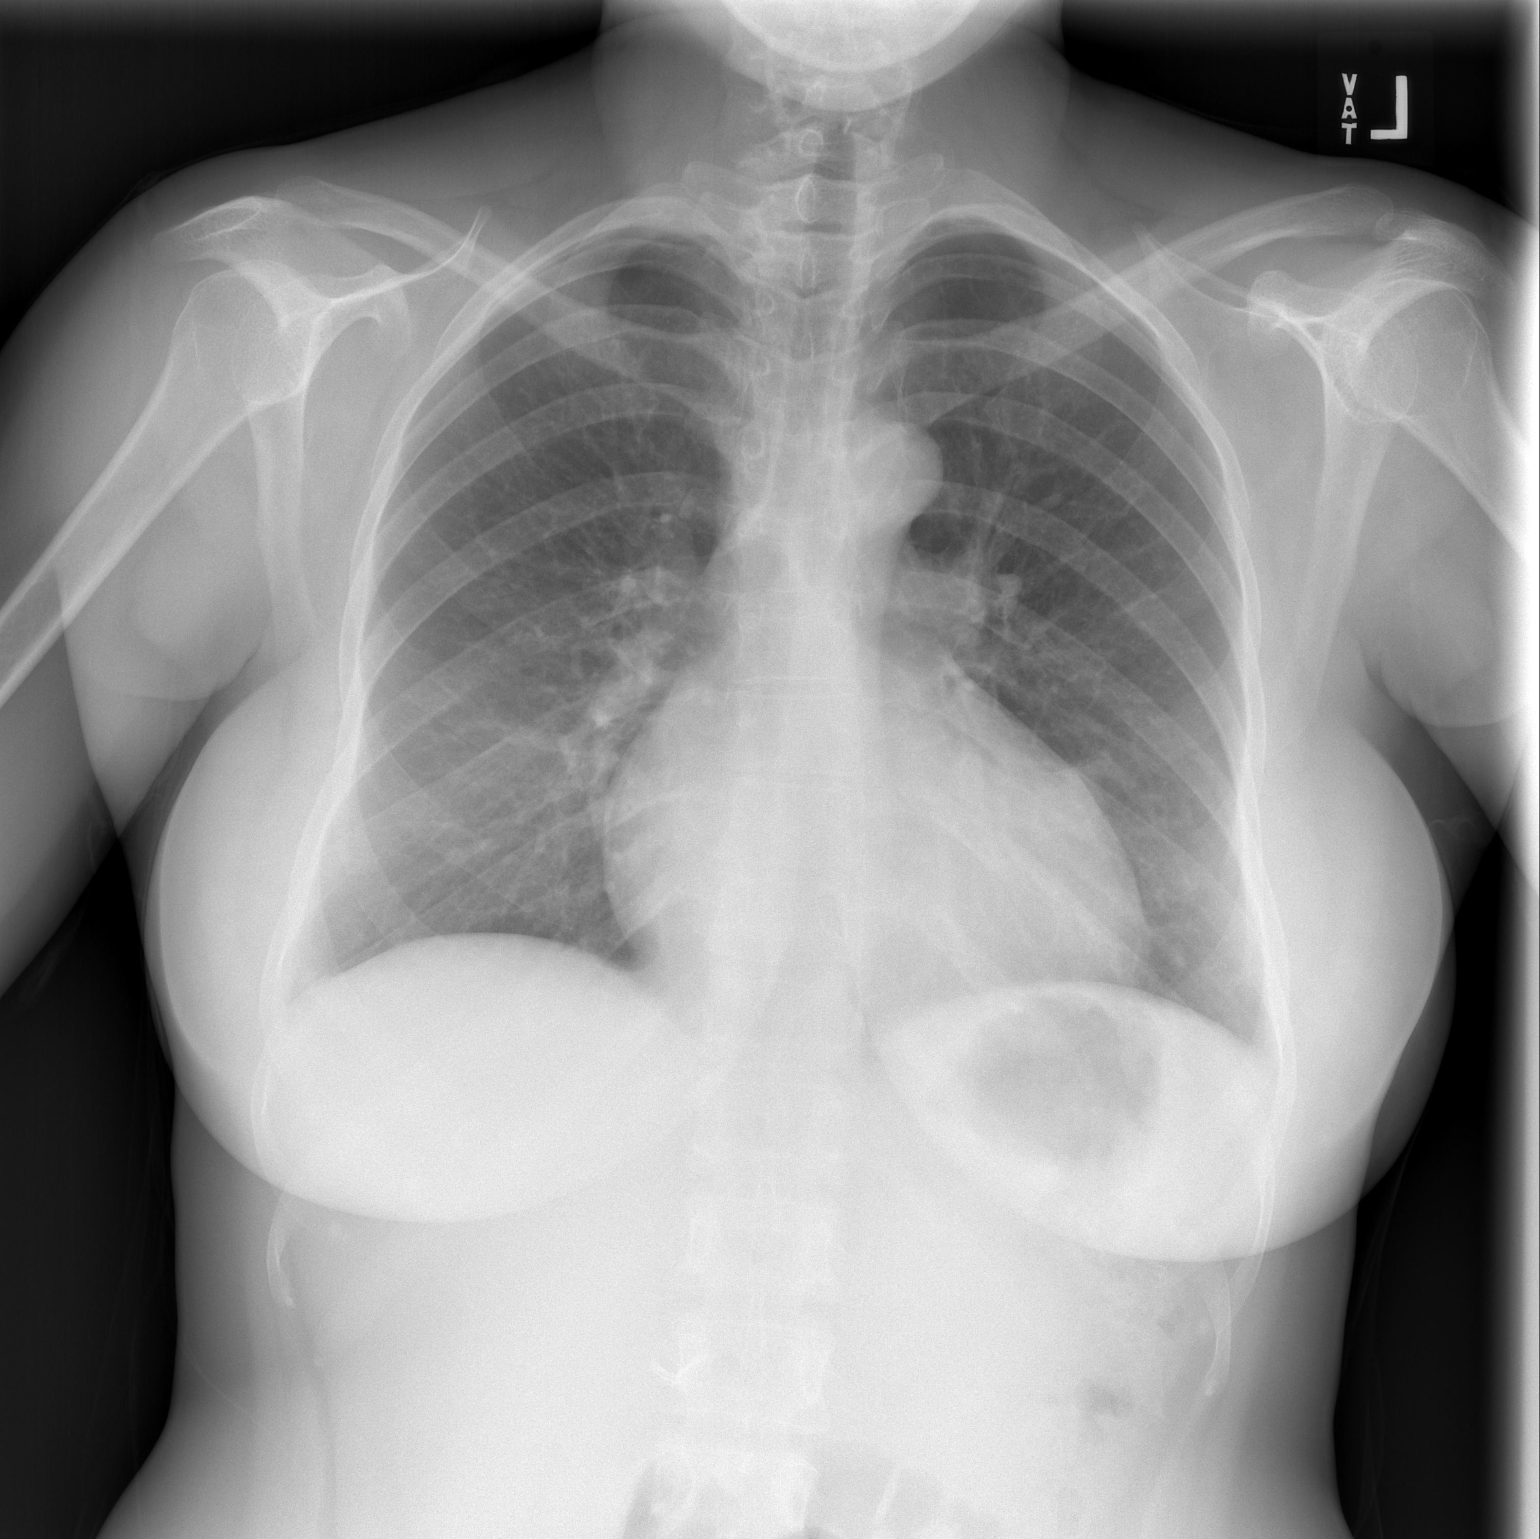

[w chest lat]
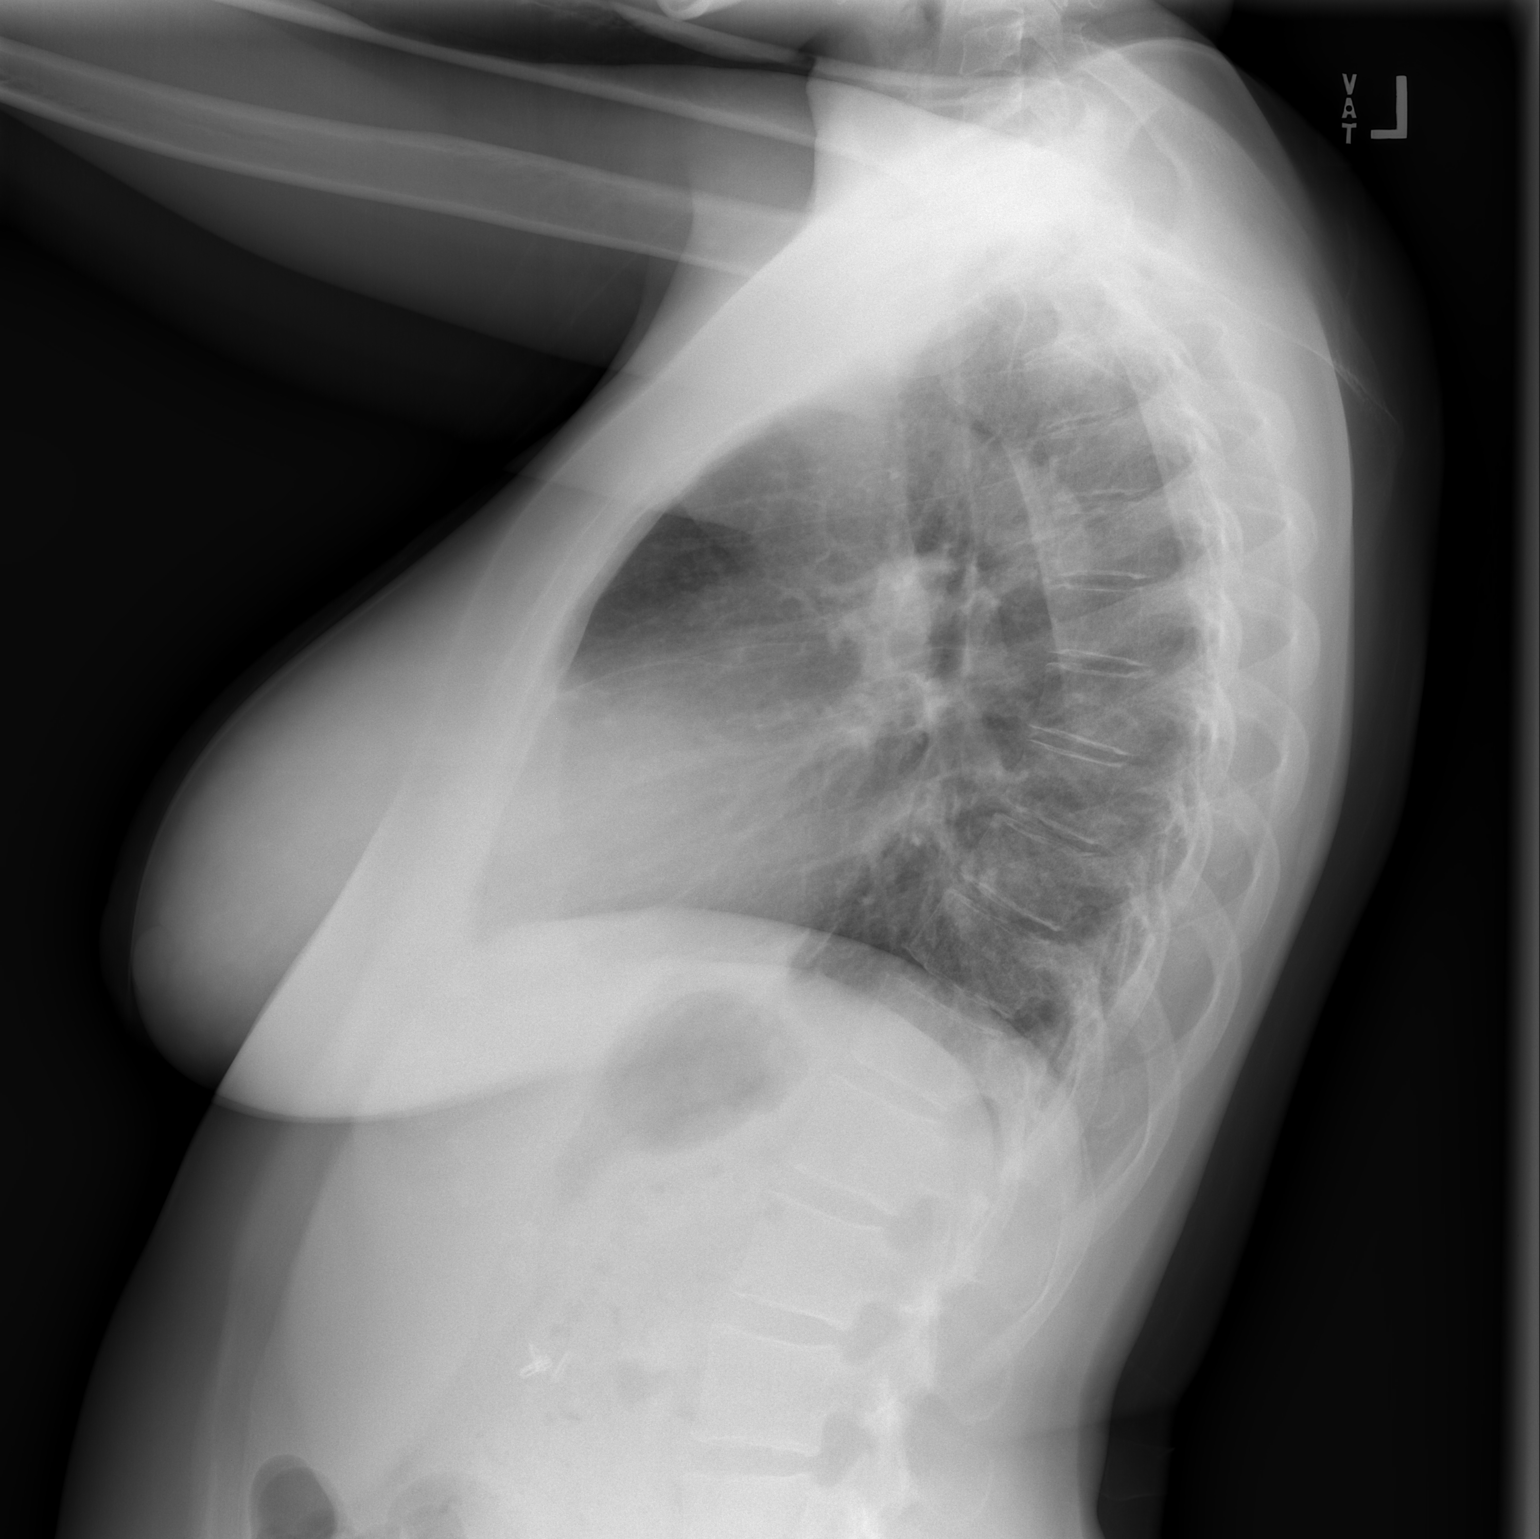

[2 of 2 positions shown; findings below may reference images not displayed]

FINDINGS: The heart size and mediastinal contours are within normal limits.
Both lungs are clear. The visualized skeletal structures are
unremarkable.
IMPRESSION: Normal study.

## 2021-11-03 ENCOUNTER — Ambulatory Visit (INDEPENDENT_AMBULATORY_CARE_PROVIDER_SITE_OTHER): Payer: BC Managed Care – PPO | Admitting: Dermatology

## 2021-11-03 ENCOUNTER — Other Ambulatory Visit: Payer: Self-pay

## 2021-11-03 ENCOUNTER — Encounter: Payer: Self-pay | Admitting: Dermatology

## 2021-11-03 DIAGNOSIS — L309 Dermatitis, unspecified: Secondary | ICD-10-CM

## 2021-11-03 MED ORDER — CLOBETASOL PROPIONATE 0.05 % EX OINT: 1.0000 "application " | TOPICAL_OINTMENT | Freq: Two times a day (BID) | CUTANEOUS | 2 refills | Status: DC

## 2021-11-23 ENCOUNTER — Encounter: Payer: Self-pay | Admitting: Dermatology

## 2021-11-23 NOTE — Progress Notes (Signed)
° °  New Patient   Subjective  Krista [name] is a 36 y.o. female who presents for the following: Eczema (Comes and goes- only occurs under rings- tx- tac cream- nothing really helps).  Rash left hand Location:  Duration:  Quality:  Associated Signs/Symptoms: Modifying Factors:  Severity:  Timing: Context:    The following portions of the chart were reviewed this encounter and updated as appropriate:  Tobacco   Allergies   Meds   Problems   Med Hx   Surg Hx   Fam Hx       Objective  Well appearing patient in no apparent distress; mood and affect are within normal limits. Left Proximal 4th Finger Minimal active dermatitis present today on finger.  No breaking out under other jewelry.  I discussed in some detail the distinction of irritant contact dermatitis versus allergic contact dermatitis.  I encouraged her to consider wearing the ring on her right hand for 1 to 2 weeks to see if that hand breaks out.  I encouraged her to use gloves to protect her hands with wet work or handling foods or cleansers.    A focused examination was performed including face, hands, nails.. Relevant physical exam findings are noted in the Assessment and Plan.   Assessment & Plan  Dermatitis Left Proximal 4th Finger  4 sided Band-Aid given to test for nickel at home; call us with the results next week.  Use clobetasol on the affected area nightly for 10 to 14 days.  We will consider scheduling him for full patch test series if no improvement.  clobetasol ointment (TEMOVATE) 0.05 % - Left Proximal 4th Finger Apply 1 application topically 2 (two) times daily.

## 2021-11-24 ENCOUNTER — Other Ambulatory Visit: Payer: Self-pay | Admitting: Family Medicine

## 2021-12-01 ENCOUNTER — Ambulatory Visit: Payer: BC Managed Care – PPO | Admitting: Family Medicine

## 2022-01-12 ENCOUNTER — Ambulatory Visit: Payer: BC Managed Care – PPO | Admitting: Family Medicine

## 2022-01-19 ENCOUNTER — Ambulatory Visit: Payer: BC Managed Care – PPO | Admitting: Family Medicine

## 2022-02-26 ENCOUNTER — Ambulatory Visit (INDEPENDENT_AMBULATORY_CARE_PROVIDER_SITE_OTHER): Payer: BC Managed Care – PPO | Admitting: Nurse Practitioner

## 2022-02-26 ENCOUNTER — Encounter: Payer: Self-pay | Admitting: Nurse Practitioner

## 2022-02-26 VITALS — BP 125/74 | HR 100 | Temp 97.9°F | Ht 67.0 in | Wt 167.8 lb

## 2022-02-26 DIAGNOSIS — J029 Acute pharyngitis, unspecified: Secondary | ICD-10-CM | POA: Diagnosis not present

## 2022-02-26 LAB — POCT RAPID STREP A (OFFICE): Rapid Strep A Screen: POSITIVE — AB

## 2022-02-26 MED ORDER — PENICILLIN V POTASSIUM 500 MG PO TABS: 500.0000 mg | ORAL_TABLET | Freq: Three times a day (TID) | ORAL | 0 refills | Status: AC

## 2022-02-26 NOTE — Patient Instructions (Signed)
You were seen today in the [facility] for flu like symptoms. Labs were collected, results will be available via MyChart or, if abnormal, you will be contacted by clinic staff. You were prescribed medications, please take as directed. Please follow up  as needed ?

## 2022-02-26 NOTE — Progress Notes (Signed)
? ?[facility] ?[place], [place]  [address] ?Phone:  [phone]   Fax:  [phone] ?Subjective:  ? Patient ID: Krista [name], female    DOB: 1986-03-19, 36 y.o.   MRN: [id number] ? ?Chief Complaint  ?Patient presents with  ? Sore Throat  ?  Pt stated  she has a sore throat body aches, fever and shortness of breathe   ? ?HPI ?Krista [name] 36 y.o. female  has a past medical history of Chronic pain, Eczema, Heart murmur, History of blood transfusion, Missed ab, Sickle cell anemia ([place]), Vitamin D deficiency, and Wears glasses. To the [facility] for throat pain, body aches, fever and shortness of breath.  ? ?Patient states that she has had flu like symptoms x 2-3 days. Endorses non productive cough and shortness of breath x 1 when laying flat. Denies any known sick contacts, but currently works as a Animal nutritionist. Endorses throat pain, body aches and fever. States that she had elevated temp x 1, 102.8 F, which has now resolved. Has been taking OTC medications with mild to moderate improvement in symptoms.  ? ?Denies any other concerns today. Denies any fatigue, chest pain, HA or dizziness. Denies any blurred vision, numbness or tingling. ? ?Past Medical History:  ?Diagnosis Date  ? Chronic pain   ? with sickle cell   ? Eczema   ? Heart murmur   ? 07-13-2019  per pt since childhood,  has not ever had a echo done ,  asymptomatic  ? History of blood transfusion   ? x3   ? Missed ab   ? Sickle cell anemia (HCC)   ? followed by pcp---  last crisis 12-14-2018  ? Vitamin D deficiency   ? Wears glasses   ? ? ?Past Surgical History:  ?Procedure Laterality Date  ? EYE SURGERY Left 11/12/2019  ? [place]  ? ? ?Family History  ?Problem Relation Age of Onset  ? Diabetes Father   ? Hypertension Father   ? Heart disease Brother   ? Cancer Maternal Grandmother   ? Heart disease Brother   ? Breast cancer Paternal Aunt   ? Cancer Paternal Uncle   ? ? ?Social History   ? ?Socioeconomic History  ? Marital status: Married  ?  Spouse name: n/a  ? Number of children: 0  ? Years of education: 16+  ? Highest education level: Not on file  ?Occupational History  ? Occupation: TEACHER  ?  Employer: [employer]  ?Tobacco Use  ? Smoking status: Never  ? Smokeless tobacco: Never  ?Vaping Use  ? Vaping Use: Never used  ?Substance and Sexual Activity  ? Alcohol use: Not Currently  ?  Comment: socially  ? Drug use: Never  ? Sexual activity: Yes  ?  Birth control/protection: None  ?Other Topics Concern  ? Not on file  ?Social History Narrative  ? ** Merged History Encounter **  ?    ? Lives alone.  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 Currently teaches at [employer].  ? ?Social Determinants of Health  ? ?Financial Resource Strain: Not on file  ?Food Insecurity: Not on file  ?Transportation Needs: Not on file  ?Physical Activity: Not on file  ?Stress: Not on file  ?Social Connections: Not on file  ?Intimate Partner Violence: Not on file  ? ? ?Outpatient Medications Prior to Visit  ?Medication Sig Dispense Refill  ? ASPIRIN LOW  DOSE 81 MG EC tablet TAKE 1 TABLET BY MOUTH EVERY DAY 90 tablet 1  ? azaTHIOprine (IMURAN) 50 MG tablet Take 50 mg by mouth daily.    ? Bromfenac Sodium (PROLENSA) 0.07 % SOLN Place 1 drop into the left eye 4 (four) times daily. 6 mL 3  ? camphor-menthol (SARNA) lotion Apply 1 application topically as needed for itching. 222 mL 0  ? cetirizine (ZYRTEC) 10 MG tablet TAKE 1 TABLET BY MOUTH ONCE DAILY AS NEEDED 60 tablet 1  ? clobetasol ointment (TEMOVATE) 5.00 % Apply 1 application topically 2 (two) times daily. 60 g 2  ? dorzolamide-timolol (COSOPT) 22.3-6.8 MG/ML ophthalmic solution Place 1 drop into the left eye 2 (two) times daily. 10 mL 5  ? ibuprofen (ADVIL) 800 MG tablet Take 1 tablet (800 mg total) by mouth every 8 (eight) hours as needed. 60 tablet 1  ? levonorgestrel (MIRENA, 52 MG,) 20 MCG/DAY IUD Mirena 20  mcg/24 hours (7 yrs) 52 mg intrauterine device ? Take 1 device by intrauterine route.    ? oxycodone (OXY-IR) 5 MG capsule Take 1 capsule (5 mg total) by mouth every 4 (four) hours as needed. 30 capsule 0  ? prednisoLONE acetate (PRED FORTE) 1 % ophthalmic suspension SMARTSIG:In Eye(s)    ? triamcinolone ointment (KENALOG) 0.5 % Apply 1 application topically 2 (two) times daily. 30 g 3  ? Vitamin D, Ergocalciferol, (DRISDOL) 1.25 MG (50000 UNIT) CAPS capsule TAKE 1 CAPSULE BY MOUTH ONE TIME PER WEEK 12 capsule 1  ? folic acid (FOLVITE) 1 MG tablet Take 1 tablet by mouth once daily 90 tablet 0  ? Prenatal Vit-Fe Fumarate-FA (PRENATAL MULTIVITAMIN) TABS tablet Take 1 tablet by mouth daily at 12 noon. (Patient not taking: Reported on 11/03/2021)    ? ?No facility-administered medications prior to visit.  ? ? ?Allergies  ?Allergen Reactions  ? Keflex [Cephalexin] Swelling  ?  Face swelling  ? ? ?Review of Systems  ?Constitutional:  Positive for fever. Negative for chills and malaise/fatigue.  ?HENT:  Positive for congestion and sore throat. Negative for ear discharge, ear pain, hearing loss, nosebleeds, sinus pain and tinnitus.   ?Respiratory:  Positive for cough. Negative for shortness of breath and stridor.   ?Cardiovascular:  Negative for chest pain, palpitations and leg swelling.  ?Gastrointestinal:  Negative for abdominal pain, blood in stool, constipation, diarrhea, nausea and vomiting.  ?Skin: Negative.   ?Neurological: Negative.   ?Psychiatric/Behavioral:  Negative for depression. The patient is not nervous/anxious.   ?All other systems reviewed and are negative. ? ?   ?Objective:  ?  ?Physical Exam ?Vitals reviewed.  ?Constitutional:   ?   General: She is not in acute distress. ?   Appearance: Normal appearance. She is well-developed and normal weight.  ?HENT:  ?   Head: Normocephalic.  ?   Right Ear: Tympanic membrane and ear canal normal. No drainage, swelling or tenderness. No middle ear effusion. Tympanic  membrane is not erythematous.  ?   Left Ear: Tympanic membrane and ear canal normal. No drainage, swelling or tenderness.  No middle ear effusion. Tympanic membrane is not erythematous.  ?   Nose: Congestion present.  ?   Mouth/Throat:  ?   Tonsils: Tonsillar exudate and tonsillar abscess present. 0 on the right. 0 on the left.  ?Eyes:  ?   Extraocular Movements:  ?   Right eye: Normal extraocular motion.  ?   Left eye: Normal extraocular motion.  ?   Conjunctiva/sclera: Conjunctivae  normal.  ?   Pupils: Pupils are equal, round, and reactive to light.  ?Neck:  ?   Thyroid: No thyromegaly.  ?Cardiovascular:  ?   Rate and Rhythm: Normal rate and regular rhythm.  ?   Pulses: Normal pulses.  ?   Heart sounds: Normal heart sounds.  ?   Comments: No obvious peripheral edema ?Pulmonary:  ?   Effort: Pulmonary effort is normal.  ?   Breath sounds: Normal breath sounds.  ?Musculoskeletal:  ?   Cervical back: Normal range of motion and neck supple.  ?Lymphadenopathy:  ?   Cervical: No cervical adenopathy.  ?Skin: ?   General: Skin is warm and dry.  ?   Capillary Refill: Capillary refill takes less than 2 seconds.  ?Neurological:  ?   Mental Status: She is alert.  ?Psychiatric:     ?   Mood and Affect: Mood normal.     ?   Behavior: Behavior normal.     ?   Thought Content: Thought content normal.     ?   Judgment: Judgment normal.  ? ? ?BP 125/74 (BP Location: Right Arm, Patient Position: Sitting, Cuff Size: Normal)   Pulse 100   Temp 97.9 ?F (36.6 ?C)   Ht '5\' 7"'$  (1.702 m)   Wt 167 lb 12.8 oz (76.1 kg)   SpO2 100%   BMI 26.28 kg/m?  ?Wt Readings from Last 3 Encounters:  ?02/26/22 167 lb 12.8 oz (76.1 kg)  ?08/25/21 168 lb (76.2 kg)  ?07/14/21 164 lb 12.8 oz (74.8 kg)  ? ? ?Immunization History  ?Administered Date(s) Administered  ? Influenza,inj,Quad PF,6+ Mos 08/12/2015, 10/15/2016, 06/08/2017, 08/21/2019, 08/26/2020, 07/14/2021  ? PFIZER Comirnaty(Gray Top)Covid-19 Tri-Sucrose Vaccine 07/05/2020, 07/26/2020  ? PPD  Test 05/22/2015  ? Pneumococcal Polysaccharide-23 08/12/2015, 05/06/2018, 04/14/2021  ? Tdap 03/06/2020  ? ? ?Diabetic Foot Exam - Simple   ?No data filed ?  ? ? ?Lab Results  ?Component Value Date  ? TSH 2.21 06/07/201

## 2022-02-28 ENCOUNTER — Other Ambulatory Visit: Payer: Self-pay | Admitting: Family Medicine

## 2022-02-28 DIAGNOSIS — D571 Sickle-cell disease without crisis: Secondary | ICD-10-CM

## 2022-03-16 ENCOUNTER — Ambulatory Visit: Payer: BC Managed Care – PPO | Admitting: Family Medicine

## 2022-03-18 ENCOUNTER — Ambulatory Visit: Payer: BC Managed Care – PPO | Admitting: Family Medicine

## 2022-04-13 ENCOUNTER — Encounter: Payer: Self-pay | Admitting: Family Medicine

## 2022-04-13 ENCOUNTER — Ambulatory Visit (INDEPENDENT_AMBULATORY_CARE_PROVIDER_SITE_OTHER): Payer: BC Managed Care – PPO | Admitting: Family Medicine

## 2022-04-13 DIAGNOSIS — D571 Sickle-cell disease without crisis: Secondary | ICD-10-CM

## 2022-04-13 DIAGNOSIS — E559 Vitamin D deficiency, unspecified: Secondary | ICD-10-CM | POA: Diagnosis not present

## 2022-04-23 ENCOUNTER — Ambulatory Visit (HOSPITAL_COMMUNITY): Payer: BC Managed Care – PPO

## 2022-04-23 ENCOUNTER — Encounter (HOSPITAL_COMMUNITY): Payer: Self-pay | Admitting: Emergency Medicine

## 2022-04-23 ENCOUNTER — Ambulatory Visit (HOSPITAL_COMMUNITY)
Admission: EM | Admit: 2022-04-23 | Discharge: 2022-04-23 | Disposition: A | Discharge status: Home / Self Care | Payer: BC Managed Care – PPO | Attending: Internal Medicine | Admitting: Internal Medicine

## 2022-04-23 DIAGNOSIS — H60331 Swimmer's ear, right ear: Secondary | ICD-10-CM

## 2022-04-23 DIAGNOSIS — H6123 Impacted cerumen, bilateral: Secondary | ICD-10-CM | POA: Diagnosis not present

## 2022-04-23 DIAGNOSIS — H9201 Otalgia, right ear: Secondary | ICD-10-CM | POA: Diagnosis not present

## 2022-04-23 MED ORDER — CIPROFLOXACIN-DEXAMETHASONE 0.3-0.1 % OT SUSP: 4.0000 [drp] | Freq: Two times a day (BID) | OTIC | 0 refills | Status: DC

## 2022-04-23 NOTE — ED Provider Notes (Signed)
[facility]    CSN: [id number] Arrival date & time: 04/23/22  1436      History   Chief Complaint Chief Complaint  Patient presents with  . Ear Fullness    Entered by patient  . Otalgia    HPI Krista [name] is a 36 y.o. female.   Patient presents to urgent care for evaluation of right-sided ear fullness for the last week.  She is currently taking Augmentin for a sinus infection and is halfway through her course.  She also recently went to the beach and spent some time swimming.  Denies decreased hearing ability bilaterally, drainage from the right ear, sharp pain to the ears bilaterally, sore throat, nasal congestion, neck pain, abdominal pain, nausea, headache, and eye drainage.  Also denies fever/chills.  She has not attempted use of any over-the-counter medications or otic drops prior to arrival to urgent care.    Ear Fullness  Otalgia   Past Medical History:  Diagnosis Date  . Chronic pain    with sickle cell   . Eczema   . Heart murmur    07-13-2019  per pt since childhood,  has not ever had a echo done ,  asymptomatic  . History of blood transfusion    x3   . Missed ab   . Sickle cell anemia ([place])    followed by pcp---  last crisis 12-14-2018  . Vitamin D deficiency   . Wears glasses     Patient Active Problem List   Diagnosis Date Noted  . Hypokalemia 01/13/2021  . History of pneumonia 01/06/2021  . Chest pain 12/09/2020  . Sickle cell anemia ([place]) 12/09/2020  . Leukocytosis 12/09/2020  . Healthcare-associated pneumonia 07/29/2020  . Term pregnancy 06/03/2020  . Sickle cell pain crisis ([place]) 12/14/2018  . Pregnancy 05/04/2018  . Amenorrhea 06/11/2017  . Cellulitis of left upper arm 01/11/2017  . 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 Thrombocytosis 08/12/2015  . Gastroesophageal reflux disease without esophagitis 08/12/2015  . Sickle cell crisis ([place]) 02/03/2013    Past Surgical History:  Procedure Laterality Date  . EYE  SURGERY Left 11/12/2019  . LAPAROSCOPIC CHOLECYSTECTOMY  1997    OB History     Gravida  5   Para  2   Term  2   Preterm      AB  3   Living  2      SAB  3   IAB      Ectopic      Multiple  0   Live Births  2            Home Medications    Prior to Admission medications   Medication Sig Start Date End Date Taking? Authorizing Provider  ASPIRIN LOW DOSE 81 MG EC tablet TAKE 1 TABLET BY MOUTH EVERY DAY 03/09/21   Dorena Dew, FNP  azaTHIOprine (IMURAN) 50 MG tablet Take 50 mg by mouth daily.    [provider]  Bromfenac Sodium (PROLENSA) 0.07 % SOLN Place 1 drop into the left eye 4 (four) times daily. 09/03/20   Bernarda Caffey, MD  camphor-menthol [facility]) lotion Apply 1 application topically as needed for itching. 04/14/21   Dorena Dew, FNP  cetirizine (ZYRTEC) 10 MG tablet TAKE 1 TABLET BY MOUTH ONCE DAILY AS NEEDED 11/27/21   Dorena Dew, FNP  clobetasol ointment (TEMOVATE) 4.96 % Apply 1 application topically 2 (two) times daily. 11/03/21   Lavonna Monarch, MD  dorzolamide-timolol (  COSOPT) 22.3-6.8 MG/ML ophthalmic solution Place 1 drop into the left eye 2 (two) times daily. 08/27/20   Bernarda Caffey, MD  folic acid (FOLVITE) 1 MG tablet Take 1 tablet by mouth once daily 03/01/22   Fenton Foy, NP  ibuprofen (ADVIL) 800 MG tablet Take 1 tablet (800 mg total) by mouth every 8 (eight) hours as needed. 07/24/21   Dorena Dew, FNP  levonorgestrel (MIRENA, 52 MG,) 20 MCG/DAY IUD Mirena 20 mcg/24 hours (7 yrs) 52 mg intrauterine device  Take 1 device by intrauterine route.    [provider]  oxycodone (OXY-IR) 5 MG capsule Take 1 capsule (5 mg total) by mouth every 4 (four) hours as needed. 07/24/21   Dorena Dew, FNP  prednisoLONE acetate (PRED FORTE) 1 % ophthalmic suspension SMARTSIG:In Eye(s) 04/21/21   [provider]  triamcinolone ointment (KENALOG) 0.5 % Apply 1 application topically 2 (two) times daily.  03/02/21   Dorena Dew, FNP  Vitamin D, Ergocalciferol, (DRISDOL) 1.25 MG (50000 UNIT) CAPS capsule TAKE 1 CAPSULE BY MOUTH ONE TIME PER WEEK 09/03/21   Dorena Dew, FNP    Family History Family History  Problem Relation Age of Onset  . Diabetes Father   . Hypertension Father   . Heart disease Brother   . Cancer Maternal Grandmother   . Heart disease Brother   . Breast cancer Paternal Aunt   . Cancer Paternal Uncle     Social History Social History   Tobacco Use  . Smoking status: Never  . Smokeless tobacco: Never  Vaping Use  . Vaping Use: Never used  Substance Use Topics  . Alcohol use: Not Currently    Comment: socially  . Drug use: Never     Allergies   Keflex [cephalexin]   Review of Systems Review of Systems  HENT:  Positive for ear pain.   Per HPI   Physical Exam Triage Vital Signs ED Triage Vitals [04/23/22 1520]  Enc Vitals Group     BP 121/80     Pulse Rate 71     Resp 16     Temp 98.7 F (37.1 C)     Temp Source Oral     SpO2 99 %     Weight      Height      Head Circumference      Peak Flow      Pain Score 5     Pain Loc      Pain Edu?      Excl. in [place]?    No data found.  Updated Vital Signs BP 121/80 (BP Location: Left Arm)   Pulse 71   Temp 98.7 F (37.1 C) (Oral)   Resp 16   SpO2 99%   Visual Acuity Right Eye Distance:   Left Eye Distance:   Bilateral Distance:    Right Eye Near:   Left Eye Near:    Bilateral Near:     Physical Exam Vitals and nursing note reviewed.  Constitutional:      Appearance: Normal appearance. She is not ill-appearing or toxic-appearing.     Comments: Very pleasant patient sitting on exam in position of comfort table in no acute distress.   HENT:     Head: Normocephalic and atraumatic.     Right Ear: Hearing and external ear normal. There is impacted cerumen.     Left Ear: Hearing and external ear normal. There is impacted cerumen.     Nose: Nose  normal.     Mouth/Throat:      Lips: Pink.     Mouth: Mucous membranes are moist.     Pharynx: No posterior oropharyngeal erythema.  Eyes:     General: Lids are normal. Vision grossly intact. Gaze aligned appropriately.        Right eye: No discharge.        Left eye: No discharge.     Extraocular Movements: Extraocular movements intact.     Conjunctiva/sclera: Conjunctivae normal.     Pupils: Pupils are equal, round, and reactive to light.  Cardiovascular:     Rate and Rhythm: Normal rate and regular rhythm.     Heart sounds: Normal heart sounds, S1 normal and S2 normal.  Pulmonary:     Effort: Pulmonary effort is normal. No respiratory distress.     Breath sounds: Normal breath sounds and air entry.  Abdominal:     Palpations: Abdomen is soft.  Musculoskeletal:     Cervical back: Normal range of motion and neck supple. No tenderness.  Lymphadenopathy:     Cervical: No cervical adenopathy.  Skin:    General: Skin is warm and dry.     Capillary Refill: Capillary refill takes less than 2 seconds.     Findings: No rash.  Neurological:     General: No focal deficit present.     Mental Status: She is alert and oriented to person, place, and time. Mental status is at baseline.     Cranial Nerves: No dysarthria or facial asymmetry.     Gait: Gait is intact.  Psychiatric:        Mood and Affect: Mood normal.        Speech: Speech normal.        Behavior: Behavior normal.        Thought Content: Thought content normal.        Judgment: Judgment normal.     UC Treatments / Results  Labs (all labs ordered are listed, but only abnormal results are displayed) Labs Reviewed - No data to display  EKG   Radiology No results found.  Procedures Procedures (including critical care time)  Medications Ordered in UC Medications - No data to display  Initial Impression / Assessment and Plan / UC Course  I have reviewed the triage vital signs and the nursing notes.  Pertinent labs & imaging results that were  available during my care of the patient were reviewed by me and considered in my medical decision making (see chart for details).     *** Final Clinical Impressions(s) / UC Diagnoses   Final diagnoses:  Acute swimmer's ear of right side  Bilateral impacted cerumen     Discharge Instructions      Take Ciprodex eardrops 4 drops to the right ear twice daily for the next 7 days. Do not submerge your head in water for 1 week to let the right ear heal.  Continue taking your Augmentin until it is finished.  If you develop any new or worsening symptoms or do not improve in the next 2 to 3 days, please return.  If your symptoms are severe, please go to the emergency room.  Follow-up with your primary care provider for further evaluation and management of your symptoms as well as ongoing wellness visits.  I hope you feel better!     ED Prescriptions   None    PDMP not reviewed this encounter.

## 2022-04-23 NOTE — ED Triage Notes (Signed)
Pt presents with right ear fullness and pain since Saturday. States recently had a sinus infection.

## 2022-04-23 NOTE — Discharge Instructions (Signed)
Take Ciprodex eardrops 4 drops to the right ear twice daily for the next 7 days. Do not submerge your head in water for 1 week to let the right ear heal.  Continue taking your Augmentin until it is finished.  If you develop any new or worsening symptoms or do not improve in the next 2 to 3 days, please return.  If your symptoms are severe, please go to the emergency room.  Follow-up with your primary care provider for further evaluation and management of your symptoms as well as ongoing wellness visits.  I hope you feel better!

## 2022-04-28 MED ORDER — LIDOCAINE HCL (PF) 1 % IJ SOLN
INTRAMUSCULAR | Status: AC
  Filled 2022-04-28: qty 30

## 2022-05-05 ENCOUNTER — Encounter: Payer: Self-pay | Admitting: Nurse Practitioner

## 2022-05-05 ENCOUNTER — Ambulatory Visit (INDEPENDENT_AMBULATORY_CARE_PROVIDER_SITE_OTHER): Payer: BC Managed Care – PPO | Admitting: Nurse Practitioner

## 2022-05-05 VITALS — BP 122/81 | HR 82 | Temp 98.0°F | Ht 67.0 in | Wt 168.6 lb

## 2022-05-05 DIAGNOSIS — H9191 Unspecified hearing loss, right ear: Secondary | ICD-10-CM | POA: Diagnosis not present

## 2022-05-05 DIAGNOSIS — D571 Sickle-cell disease without crisis: Secondary | ICD-10-CM | POA: Diagnosis not present

## 2022-05-05 DIAGNOSIS — E559 Vitamin D deficiency, unspecified: Secondary | ICD-10-CM

## 2022-05-05 DIAGNOSIS — H938X1 Other specified disorders of right ear: Secondary | ICD-10-CM

## 2022-05-05 MED ORDER — FLUTICASONE PROPIONATE 50 MCG/ACT NA SUSP
2.0000 | Freq: Every day | NASAL | 6 refills | Status: DC
Start: 2022-05-05 — End: 2022-11-11

## 2022-05-05 NOTE — Progress Notes (Signed)
$'@Patient'k$  ID: Krista [name], female    DOB: [birth year], 36 y.o.   MRN: [id number]  Chief Complaint  Patient presents with   Ear Pain    Pt is here for RT ear pain pt states she went to urgent care was told she has swimmers ear pt was prescribe ear drops but still has ear pressure    Referring provider: Dorena Dew, FNP   HPI  Patient presents today for follow-up on ear pain.  She has been having right ear pain and fullness since beginning of July.  She did complete a course of Augmentin and a course of eardrops prescribed by urgent care.  She was diagnosed with otitis externa.  She continues to have ear fullness and decreased hearing to her right ear.  She was prescribed a prednisone taper by her optometrist yesterday because she has recently had cataract surgery.  We discussed that this can help her ear as well.  We will start her on Flonase and refer her to ENT. Denies f/c/s, n/v/d, hemoptysis, PND, leg swelling Denies chest pain or edema    Allergies  Allergen Reactions   Keflex [Cephalexin] Swelling    Face swelling    Immunization History  Administered Date(s) Administered   Influenza,inj,Quad PF,6+ Mos 08/12/2015, 10/15/2016, 06/08/2017, 08/21/2019, 08/26/2020, 07/14/2021   PFIZER Comirnaty(Gray Top)Covid-19 Tri-Sucrose Vaccine 07/05/2020, 07/26/2020   PPD Test 05/22/2015   Pneumococcal Polysaccharide-23 08/12/2015, 05/06/2018, 04/14/2021   Tdap 03/06/2020    Past Medical History:  Diagnosis Date   Chronic pain    with sickle cell    Eczema    Heart murmur    07-13-2019  per pt since childhood,  has not ever had a echo done ,  asymptomatic   History of blood transfusion    x3    Missed ab    Sickle cell anemia (HCC)    followed by pcp---  last crisis 12-14-2018   Vitamin D deficiency    Wears glasses     Tobacco History: Social History   Tobacco Use  Smoking Status Never  Smokeless Tobacco Never   Counseling given: Not Answered   Outpatient  Encounter Medications as of 05/05/2022  Medication Sig   ASPIRIN LOW DOSE 81 MG EC tablet TAKE 1 TABLET BY MOUTH EVERY DAY   azaTHIOprine (IMURAN) 50 MG tablet Take 50 mg by mouth daily.   Bromfenac Sodium (PROLENSA) 0.07 % SOLN Place 1 drop into the left eye 4 (four) times daily.   cetirizine (ZYRTEC) 10 MG tablet TAKE 1 TABLET BY MOUTH ONCE DAILY AS NEEDED   ciprofloxacin-dexamethasone (CIPRODEX) OTIC suspension Place 4 drops into the right ear 2 (two) times daily.   clobetasol ointment (TEMOVATE) 4.09 % Apply 1 application topically 2 (two) times daily.   dorzolamide-timolol (COSOPT) 22.3-6.8 MG/ML ophthalmic solution Place 1 drop into the left eye 2 (two) times daily.   fluticasone (FLONASE) 50 MCG/ACT nasal spray Place 2 sprays into both nostrils daily.   folic acid (FOLVITE) 1 MG tablet Take 1 tablet by mouth once daily   ibuprofen (ADVIL) 800 MG tablet Take 1 tablet (800 mg total) by mouth every 8 (eight) hours as needed.   levonorgestrel (MIRENA, 52 MG,) 20 MCG/DAY IUD Mirena 20 mcg/24 hours (7 yrs) 52 mg intrauterine device  Take 1 device by intrauterine route.   oxycodone (OXY-IR) 5 MG capsule Take 1 capsule (5 mg total) by mouth every 4 (four) hours as needed.   prednisoLONE acetate (PRED FORTE) 1 % ophthalmic suspension SMARTSIG:In  Eye(s)   triamcinolone ointment (KENALOG) 0.5 % Apply 1 application topically 2 (two) times daily.   Vitamin D, Ergocalciferol, (DRISDOL) 1.25 MG (50000 UNIT) CAPS capsule TAKE 1 CAPSULE BY MOUTH ONE TIME PER WEEK   camphor-menthol (SARNA) lotion Apply 1 application topically as needed for itching. (Patient not taking: Reported on 05/05/2022)   No facility-administered encounter medications on file as of 05/05/2022.     Review of Systems  Review of Systems  Constitutional: Negative.   HENT: Negative.    Cardiovascular: Negative.   Gastrointestinal: Negative.   Allergic/Immunologic: Negative.   Neurological: Negative.   Psychiatric/Behavioral:  Negative.         Physical Exam  BP 122/81 (BP Location: Left Arm, Patient Position: Sitting, Cuff Size: Normal)   Pulse 82   Temp 98 F (36.7 C)   Ht '5\' 7"'$  (1.702 m)   Wt 168 lb 9.6 oz (76.5 kg)   SpO2 100%   BMI 26.41 kg/m   Wt Readings from Last 5 Encounters:  05/05/22 168 lb 9.6 oz (76.5 kg)  02/26/22 167 lb 12.8 oz (76.1 kg)  08/25/21 168 lb (76.2 kg)  07/14/21 164 lb 12.8 oz (74.8 kg)  04/14/21 161 lb 0.2 oz (73 kg)     Physical Exam Vitals and nursing note reviewed.  Constitutional:      General: She is not in acute distress.    Appearance: She is well-developed.  Cardiovascular:     Rate and Rhythm: Normal rate and regular rhythm.  Pulmonary:     Effort: Pulmonary effort is normal.     Breath sounds: Normal breath sounds.  Neurological:     Mental Status: She is alert and oriented to person, place, and time.       Assessment & Plan:   Ear fullness, right - fluticasone (FLONASE) 50 MCG/ACT nasal spray; Place 2 sprays into both nostrils daily.  Dispense: 16 g; Refill: 6 - Ambulatory referral to ENT  2. Decreased hearing, right  - Ambulatory referral to ENT    Follow up:  Follow up as needed     Fenton Foy, NP 05/05/2022

## 2022-05-05 NOTE — Patient Instructions (Signed)
1. Ear fullness, right  - fluticasone (FLONASE) 50 MCG/ACT nasal spray; Place 2 sprays into both nostrils daily.  Dispense: 16 g; Refill: 6 - Ambulatory referral to ENT  2. Decreased hearing, right  - Ambulatory referral to ENT    Follow up:  Follow up as needed

## 2022-05-05 NOTE — Assessment & Plan Note (Signed)
-   fluticasone (FLONASE) 50 MCG/ACT nasal spray; Place 2 sprays into both nostrils daily.  Dispense: 16 g; Refill: 6 - Ambulatory referral to ENT  2. Decreased hearing, right  - Ambulatory referral to ENT    Follow up:  Follow up as needed

## 2022-05-06 LAB — CMP14+CBC/D/PLT+FER+RETIC+V...
ALT: 13 IU/L (ref 0–32)
AST: 19 IU/L (ref 0–40)
Albumin/Globulin Ratio: 1.7 (ref 1.2–2.2)
Albumin: 4.8 g/dL (ref 3.9–4.9)
Alkaline Phosphatase: 83 IU/L (ref 44–121)
BUN/Creatinine Ratio: 13 (ref 9–23)
BUN: 6 mg/dL (ref 6–20)
Basophils Absolute: 0.1 10*3/uL (ref 0.0–0.2)
Basos: 1 %
Bilirubin Total: 2.4 mg/dL — ABNORMAL HIGH (ref 0.0–1.2)
CO2: 19 mmol/L — ABNORMAL LOW (ref 20–29)
Calcium: 9.9 mg/dL (ref 8.7–10.2)
Chloride: 108 mmol/L — ABNORMAL HIGH (ref 96–106)
Creatinine, Ser: 0.48 mg/dL — ABNORMAL LOW (ref 0.57–1.00)
EOS (ABSOLUTE): 0.1 10*3/uL (ref 0.0–0.4)
Eos: 1 %
Ferritin: 454 ng/mL — ABNORMAL HIGH (ref 15–150)
Globulin, Total: 2.8 g/dL (ref 1.5–4.5)
Glucose: 75 mg/dL (ref 70–99)
Hematocrit: 25.8 % — ABNORMAL LOW (ref 34.0–46.6)
Hemoglobin: 8.6 g/dL — ABNORMAL LOW (ref 11.1–15.9)
Immature Grans (Abs): 0.1 10*3/uL (ref 0.0–0.1)
Immature Granulocytes: 1 %
Lymphocytes Absolute: 2.8 10*3/uL (ref 0.7–3.1)
Lymphs: 24 %
MCH: 31.4 pg (ref 26.6–33.0)
MCHC: 33.3 g/dL (ref 31.5–35.7)
MCV: 94 fL (ref 79–97)
Monocytes Absolute: 0.7 10*3/uL (ref 0.1–0.9)
Monocytes: 6 %
NRBC: 4 % — ABNORMAL HIGH (ref 0–0)
Neutrophils Absolute: 8.2 10*3/uL — ABNORMAL HIGH (ref 1.4–7.0)
Neutrophils: 67 %
Platelets: 727 10*3/uL — ABNORMAL HIGH (ref 150–450)
Potassium: 4.4 mmol/L (ref 3.5–5.2)
RBC: 2.74 x10E6/uL — CL (ref 3.77–5.28)
RDW: 15.3 % (ref 11.7–15.4)
Retic Ct Pct: 14.9 % — ABNORMAL HIGH (ref 0.6–2.6)
Sodium: 143 mmol/L (ref 134–144)
Total Protein: 7.6 g/dL (ref 6.0–8.5)
Vit D, 25-Hydroxy: 70.7 ng/mL (ref 30.0–100.0)
WBC: 12.1 10*3/uL — ABNORMAL HIGH (ref 3.4–10.8)
eGFR: 127 mL/min/{1.73_m2} (ref >59)

## 2022-05-11 ENCOUNTER — Ambulatory Visit: Payer: BC Managed Care – PPO | Admitting: Family Medicine

## 2022-06-05 ENCOUNTER — Other Ambulatory Visit: Payer: Self-pay | Admitting: Nurse Practitioner

## 2022-06-05 ENCOUNTER — Other Ambulatory Visit: Payer: Self-pay | Admitting: Family Medicine

## 2022-06-05 DIAGNOSIS — E559 Vitamin D deficiency, unspecified: Secondary | ICD-10-CM

## 2022-06-05 DIAGNOSIS — D571 Sickle-cell disease without crisis: Secondary | ICD-10-CM

## 2022-06-15 ENCOUNTER — Other Ambulatory Visit: Payer: Self-pay

## 2022-06-15 DIAGNOSIS — D571 Sickle-cell disease without crisis: Secondary | ICD-10-CM

## 2022-06-15 MED ORDER — FOLIC ACID 1 MG PO TABS: 1.0000 mg | ORAL_TABLET | Freq: Every day | ORAL | 0 refills | Status: DC

## 2022-06-25 IMAGING — DX DG FOOT COMPLETE 3+V*L*
3 series · 3 of 3 positions shown · non-contrast
Comparison: None.

CLINICAL DATA: Left second toe pain after injury.

EXAM:
LEFT FOOT - COMPLETE 3+ VIEW

[foot ap]
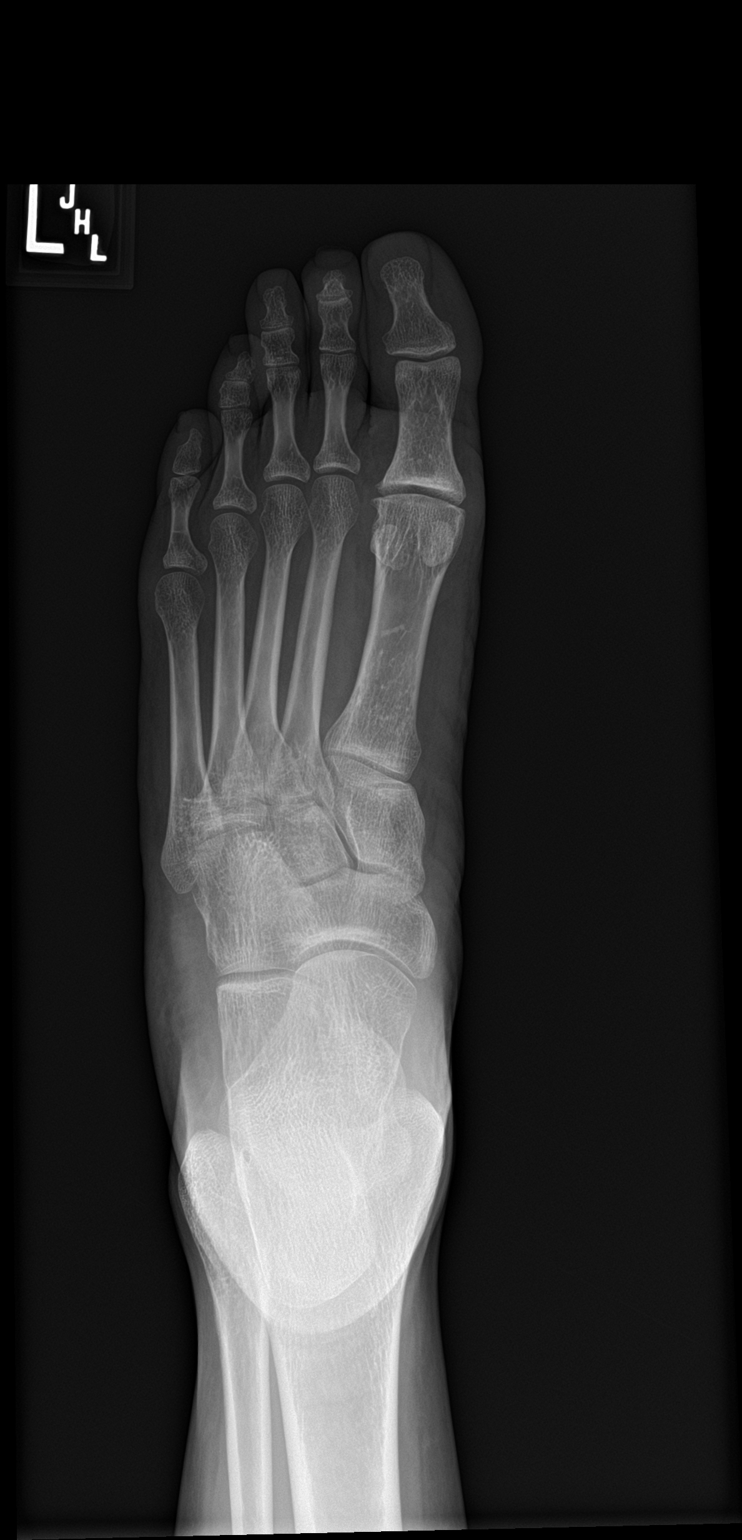

[foot obl]
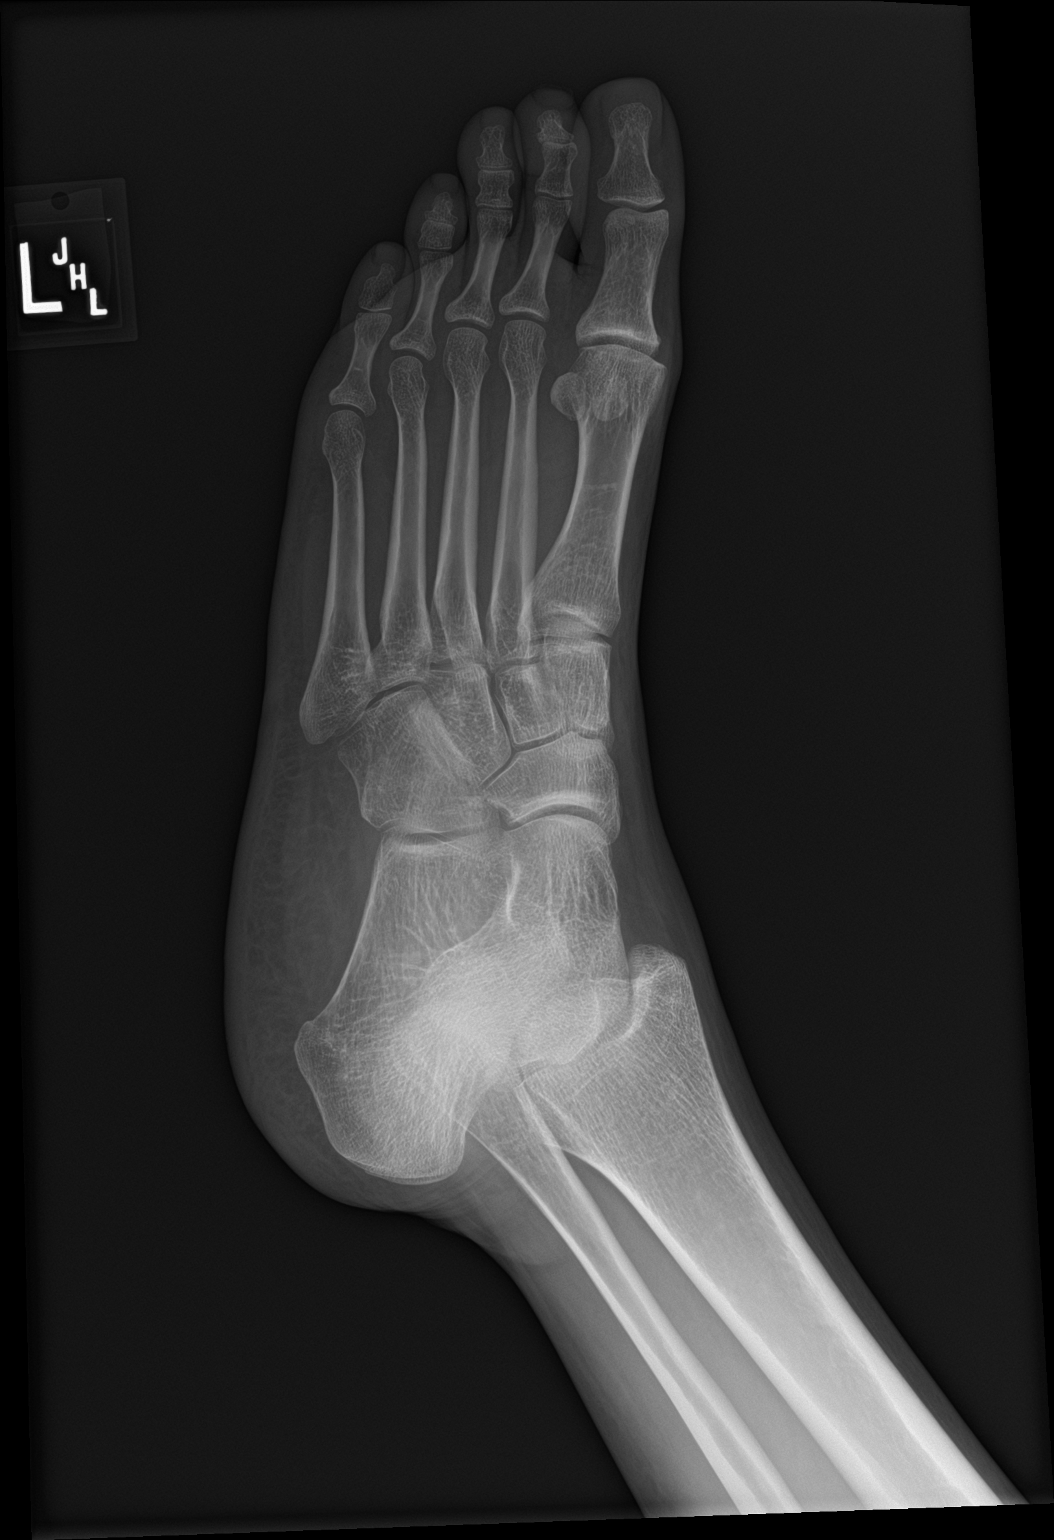

[foot lat]
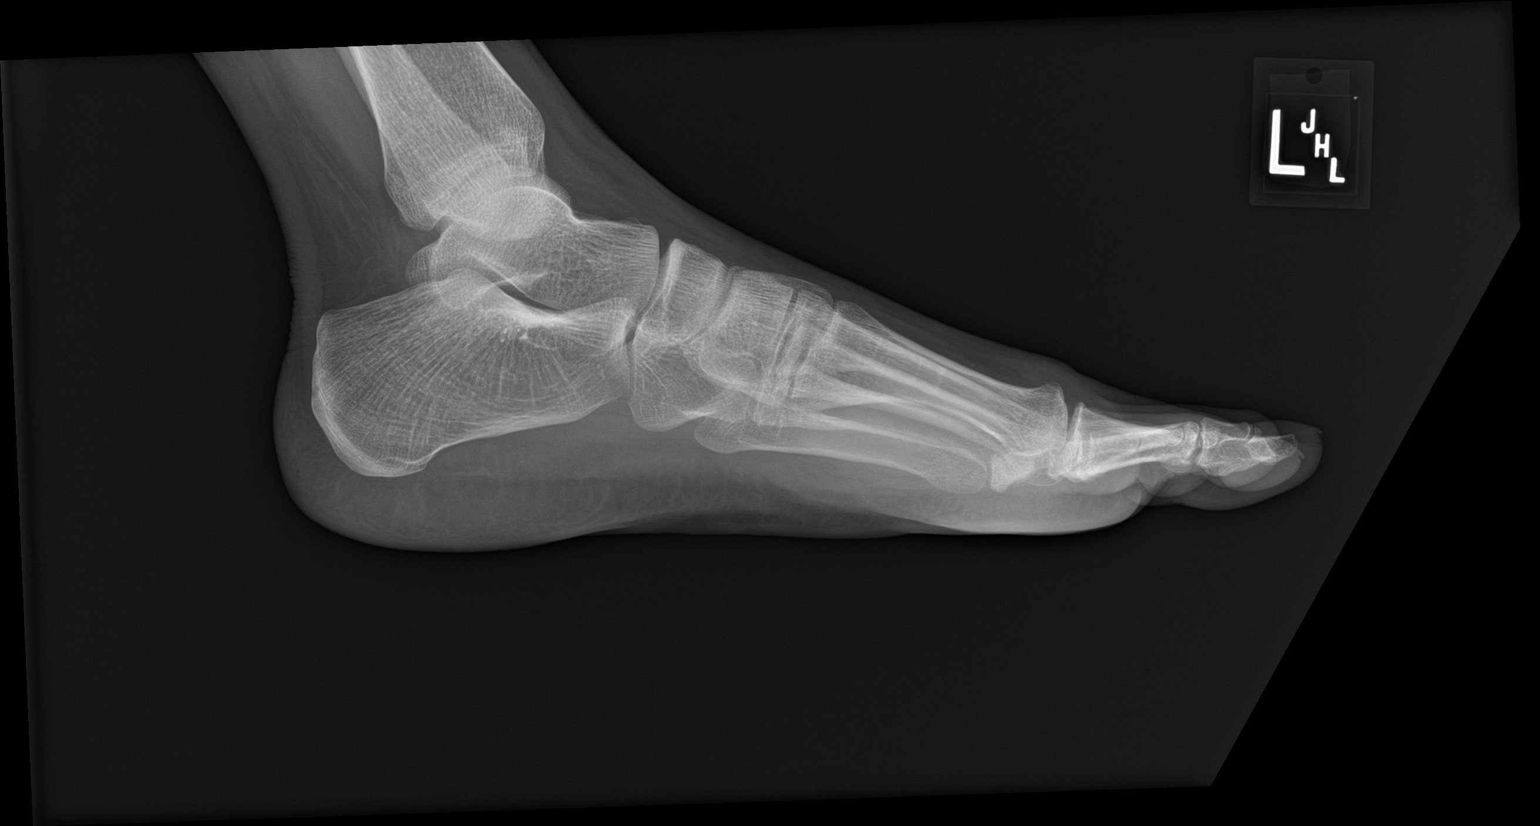

[3 of 3 positions shown; findings below may reference images not displayed]

FINDINGS: There is no evidence of fracture or dislocation. Particularly, no
fracture is seen of the second toe. Normal joint spaces and
alignment. There is no evidence of arthropathy or other focal bone
abnormality. Soft tissues are unremarkable.
IMPRESSION: Negative radiographs of the left foot. No evidence of fracture, with
special attention to the second toe.

## 2022-08-26 ENCOUNTER — Other Ambulatory Visit: Payer: Self-pay | Admitting: Nurse Practitioner

## 2022-09-12 ENCOUNTER — Other Ambulatory Visit: Payer: Self-pay | Admitting: Nurse Practitioner

## 2022-09-12 DIAGNOSIS — E559 Vitamin D deficiency, unspecified: Secondary | ICD-10-CM

## 2022-10-05 ENCOUNTER — Ambulatory Visit: Payer: BC Managed Care – PPO | Admitting: Family Medicine

## 2022-10-22 ENCOUNTER — Other Ambulatory Visit: Payer: Self-pay | Admitting: Nurse Practitioner

## 2022-10-22 DIAGNOSIS — D571 Sickle-cell disease without crisis: Secondary | ICD-10-CM

## 2022-10-26 ENCOUNTER — Ambulatory Visit: Payer: Self-pay | Admitting: Family Medicine

## 2022-11-02 ENCOUNTER — Other Ambulatory Visit: Payer: Self-pay

## 2022-11-02 DIAGNOSIS — D571 Sickle-cell disease without crisis: Secondary | ICD-10-CM

## 2022-11-02 MED ORDER — FOLIC ACID 1 MG PO TABS: 1.0000 mg | ORAL_TABLET | Freq: Every day | ORAL | 0 refills | Status: DC

## 2022-11-11 ENCOUNTER — Encounter: Payer: Self-pay | Admitting: Nurse Practitioner

## 2022-11-11 ENCOUNTER — Ambulatory Visit (INDEPENDENT_AMBULATORY_CARE_PROVIDER_SITE_OTHER): Payer: BC Managed Care – PPO | Admitting: Nurse Practitioner

## 2022-11-11 VITALS — BP 137/87 | HR 104 | Temp 97.3°F | Ht 67.0 in | Wt 167.0 lb

## 2022-11-11 DIAGNOSIS — E559 Vitamin D deficiency, unspecified: Secondary | ICD-10-CM

## 2022-11-11 DIAGNOSIS — J101 Influenza due to other identified influenza virus with other respiratory manifestations: Secondary | ICD-10-CM

## 2022-11-11 DIAGNOSIS — R059 Cough, unspecified: Secondary | ICD-10-CM | POA: Diagnosis not present

## 2022-11-11 DIAGNOSIS — R52 Pain, unspecified: Secondary | ICD-10-CM

## 2022-11-11 DIAGNOSIS — Z20828 Contact with and (suspected) exposure to other viral communicable diseases: Secondary | ICD-10-CM

## 2022-11-11 LAB — POC INFLUENZA A&B (BINAX/QUICKVUE)
Influenza A, POC: NEGATIVE
Influenza B, POC: POSITIVE — AB

## 2022-11-11 MED ORDER — VITAMIN D (ERGOCALCIFEROL) 1.25 MG (50000 UNIT) PO CAPS: ORAL_CAPSULE | ORAL | 0 refills | Status: DC

## 2022-11-11 MED ORDER — AZITHROMYCIN 250 MG PO TABS: ORAL_TABLET | ORAL | 0 refills | Status: AC

## 2022-11-11 MED ORDER — ASPIRIN 81 MG PO TBEC: 81.0000 mg | DELAYED_RELEASE_TABLET | Freq: Every day | ORAL | 1 refills | Status: DC

## 2022-11-11 MED ORDER — PREDNISONE 20 MG PO TABS
20.0000 mg | ORAL_TABLET | Freq: Every day | ORAL | 0 refills | Status: AC
Start: 2022-11-11 — End: 2022-11-16

## 2022-11-11 MED ORDER — BENZONATATE 200 MG PO CAPS: 200.0000 mg | ORAL_CAPSULE | Freq: Two times a day (BID) | ORAL | 0 refills | Status: DC | PRN

## 2022-11-11 NOTE — Progress Notes (Signed)
$'@Patient'W$  ID: [name], female    DOB: [birth year], 37 y.o.   MRN: [id number]  Chief Complaint  Patient presents with   Cough    Headaches, sneezing, coughing, chest pain, back pain, joint pain, exposure to the flu. Had a fever last night.     Referring provider: Dorena Dew, FNP   HPI  Krista [name] is a 37 year old female with a medical history significant for sickle cell disease, vitamin D deficiency, and history of peripheral focal chorioretinal inflammation    Patient presents today for sick visit.  She states that since last Friday she has been having headaches, sneezing, cough, chest congestion, back pain and bodyaches.  She states that she was running a fever last night.  She did test positive today for influenza B.  She is out of the window for Tamiflu.  We will start azithromycin, prednisone, cough medicine.  Advised to stay well-hydrated.  Advised to go to the emergency room with any worsening symptoms. Denies f/c/s, n/v/d, hemoptysis, PND, leg swelling Denies chest pain or edema      Allergies  Allergen Reactions   Keflex [Cephalexin] Swelling    Face swelling    Immunization History  Administered Date(s) Administered   Influenza,inj,Quad PF,6+ Mos 08/12/2015, 10/15/2016, 06/08/2017, 08/21/2019, 08/26/2020, 07/14/2021   PFIZER Comirnaty(Gray Top)Covid-19 Tri-Sucrose Vaccine 07/05/2020, 07/26/2020   PPD Test 05/22/2015   Pneumococcal Polysaccharide-23 08/12/2015, 05/06/2018, 04/14/2021   Tdap 03/06/2020    Past Medical History:  Diagnosis Date   Chronic pain    with sickle cell    Eczema    Heart murmur    07-13-2019  per pt since childhood,  has not ever had a echo done ,  asymptomatic   History of blood transfusion    x3    Missed ab    Sickle cell anemia ([place])    followed by pcp---  last crisis 12-14-2018   Vitamin D deficiency    Wears glasses     Tobacco History: Social History   Tobacco Use  Smoking Status Never  Smokeless  Tobacco Never   Counseling given: Not Answered   Outpatient Encounter Medications as of 11/11/2022  Medication Sig   azaTHIOprine (IMURAN) 50 MG tablet Take 50 mg by mouth daily.   azithromycin (ZITHROMAX) 250 MG tablet Take 2 tablets on day 1, then 1 tablet daily on days 2 through 5   benzonatate (TESSALON) 200 MG capsule Take 1 capsule (200 mg total) by mouth 2 (two) times daily as needed for cough.   folic acid (FOLVITE) 1 MG tablet Take 1 tablet (1 mg total) by mouth daily.   ibuprofen (ADVIL) 800 MG tablet Take 1 tablet (800 mg total) by mouth every 8 (eight) hours as needed.   levonorgestrel (MIRENA, 52 MG,) 20 MCG/DAY IUD Mirena 20 mcg/24 hours (7 yrs) 52 mg intrauterine device  Take 1 device by intrauterine route.   oxycodone (OXY-IR) 5 MG capsule Take 1 capsule (5 mg total) by mouth every 4 (four) hours as needed.   predniSONE (DELTASONE) 20 MG tablet Take 1 tablet (20 mg total) by mouth daily with breakfast for 5 days.   triamcinolone ointment (KENALOG) 0.5 % Apply 1 application topically 2 (two) times daily.   aspirin EC (ASPIRIN LOW DOSE) 81 MG tablet Take 1 tablet (81 mg total) by mouth daily. Swallow whole.   camphor-menthol (SARNA) lotion Apply 1 application topically as needed for itching. (Patient not taking: Reported on 05/05/2022)   ciprofloxacin-dexamethasone (CIPRODEX) OTIC suspension Place  4 drops into the right ear 2 (two) times daily.   clobetasol ointment (TEMOVATE) 3.08 % Apply 1 application topically 2 (two) times daily.   Vitamin D, Ergocalciferol, (DRISDOL) 1.25 MG (50000 UNIT) CAPS capsule Take 1 capsule by mouth once a week   [DISCONTINUED] ASPIRIN LOW DOSE 81 MG EC tablet TAKE 1 TABLET BY MOUTH EVERY DAY (Patient not taking: Reported on 11/11/2022)   [DISCONTINUED] Bromfenac Sodium (PROLENSA) 0.07 % SOLN Place 1 drop into the left eye 4 (four) times daily.   [DISCONTINUED] cetirizine (ZYRTEC) 10 MG tablet TAKE 1 TABLET BY MOUTH ONCE DAILY AS NEEDED    [DISCONTINUED] dorzolamide-timolol (COSOPT) 22.3-6.8 MG/ML ophthalmic solution Place 1 drop into the left eye 2 (two) times daily.   [DISCONTINUED] fluticasone (FLONASE) 50 MCG/ACT nasal spray Place 2 sprays into both nostrils daily.   [DISCONTINUED] prednisoLONE acetate (PRED FORTE) 1 % ophthalmic suspension SMARTSIG:In Eye(s)   [DISCONTINUED] Vitamin D, Ergocalciferol, (DRISDOL) 1.25 MG (50000 UNIT) CAPS capsule Take 1 capsule by mouth once a week (Patient not taking: Reported on 11/11/2022)   No facility-administered encounter medications on file as of 11/11/2022.     Review of Systems  Review of Systems  Constitutional: Negative.   HENT:  Positive for congestion, postnasal drip and sinus pain.   Respiratory:  Positive for cough.   Cardiovascular: Negative.   Gastrointestinal: Negative.   Musculoskeletal:  Positive for arthralgias and myalgias.  Allergic/Immunologic: Negative.   Neurological: Negative.   Psychiatric/Behavioral: Negative.         Physical Exam  BP 137/87   Pulse (!) 104   Temp (!) 97.3 F (36.3 C) (Temporal)   Ht '5\' 7"'$  (1.702 m)   Wt 167 lb (75.8 kg)   SpO2 100%   BMI 26.16 kg/m   Wt Readings from Last 5 Encounters:  11/11/22 167 lb (75.8 kg)  05/05/22 168 lb 9.6 oz (76.5 kg)  02/26/22 167 lb 12.8 oz (76.1 kg)  08/25/21 168 lb (76.2 kg)  07/14/21 164 lb 12.8 oz (74.8 kg)     Physical Exam Vitals and nursing note reviewed.  Constitutional:      General: She is not in acute distress.    Appearance: She is well-developed.  HENT:     Nose: Congestion present.  Cardiovascular:     Rate and Rhythm: Normal rate and regular rhythm.  Pulmonary:     Effort: Pulmonary effort is normal.     Breath sounds: Normal breath sounds.  Neurological:     Mental Status: She is alert and oriented to person, place, and time.      Lab Results:  CBC    Component Value Date/Time   WBC 12.1 (H) 05/05/2022 1059   WBC 15.6 (H) 08/25/2021 1213   RBC 2.74 (LL)  05/05/2022 1059   RBC 2.99 (L) 08/25/2021 1213   HGB 8.6 (L) 05/05/2022 1059   HCT 25.8 (L) 05/05/2022 1059   PLT 727 (H) 05/05/2022 1059   MCV 94 05/05/2022 1059   MCH 31.4 05/05/2022 1059   MCH 30.8 08/25/2021 1213   MCHC 33.3 05/05/2022 1059   MCHC 33.3 08/25/2021 1213   RDW 15.3 05/05/2022 1059   LYMPHSABS 2.8 05/05/2022 1059   MONOABS 1.9 (H) 08/25/2021 1213   EOSABS 0.1 05/05/2022 1059   BASOSABS 0.1 05/05/2022 1059    BMET    Component Value Date/Time   NA 143 05/05/2022 1059   K 4.4 05/05/2022 1059   CL 108 (H) 05/05/2022 1059   CO2 19 (  L) 05/05/2022 1059   GLUCOSE 75 05/05/2022 1059   GLUCOSE 69 (L) 08/25/2021 1213   BUN 6 05/05/2022 1059   CREATININE 0.48 (L) 05/05/2022 1059   CREATININE 0.51 03/24/2017 1501   CALCIUM 9.9 05/05/2022 1059   GFRNONAA >60 08/25/2021 1213   GFRNONAA >89 03/24/2017 1501   GFRAA 140 11/11/2020 1027   GFRAA >89 03/24/2017 1501      Assessment & Plan:   Cough - POC Influenza A&B(BINAX/QUICKVUE)  2. Body aches  - POC Influenza A&B(BINAX/QUICKVUE)  3. Exposure to the flu  - POC Influenza A&B(BINAX/QUICKVUE)  4. Vitamin D deficiency  - Vitamin D, Ergocalciferol, (DRISDOL) 1.25 MG (50000 UNIT) CAPS capsule; Take 1 capsule by mouth once a week  Dispense: 12 capsule; Refill: 0  5. Influenza B  - azithromycin (ZITHROMAX) 250 MG tablet; Take 2 tablets on day 1, then 1 tablet daily on days 2 through 5  Dispense: 6 tablet; Refill: 0 - predniSONE (DELTASONE) 20 MG tablet; Take 1 tablet (20 mg total) by mouth daily with breakfast for 5 days.  Dispense: 5 tablet; Refill: 0 - benzonatate (TESSALON) 200 MG capsule; Take 1 capsule (200 mg total) by mouth 2 (two) times daily as needed for cough.  Dispense: 20 capsule; Refill: 0  Follow up:  Follow up as scheduled  Go to the ED with any worsening symptoms.    Fenton Foy, NP 11/11/2022

## 2022-11-11 NOTE — Patient Instructions (Addendum)
1. Cough, unspecified type  - POC Influenza A&B(BINAX/QUICKVUE)  2. Body aches  - POC Influenza A&B(BINAX/QUICKVUE)  3. Exposure to the flu  - POC Influenza A&B(BINAX/QUICKVUE)  4. Vitamin D deficiency  - Vitamin D, Ergocalciferol, (DRISDOL) 1.25 MG (50000 UNIT) CAPS capsule; Take 1 capsule by mouth once a week  Dispense: 12 capsule; Refill: 0  5. Influenza B  - azithromycin (ZITHROMAX) 250 MG tablet; Take 2 tablets on day 1, then 1 tablet daily on days 2 through 5  Dispense: 6 tablet; Refill: 0 - predniSONE (DELTASONE) 20 MG tablet; Take 1 tablet (20 mg total) by mouth daily with breakfast for 5 days.  Dispense: 5 tablet; Refill: 0 - benzonatate (TESSALON) 200 MG capsule; Take 1 capsule (200 mg total) by mouth 2 (two) times daily as needed for cough.  Dispense: 20 capsule; Refill: 0  Follow up:  Follow up as scheduled  Go to the ED with any worsening symptoms.

## 2022-11-11 NOTE — Assessment & Plan Note (Signed)
-  POC Influenza A&B(BINAX/QUICKVUE)  2. Body aches  - POC Influenza A&B(BINAX/QUICKVUE)  3. Exposure to the flu  - POC Influenza A&B(BINAX/QUICKVUE)  4. Vitamin D deficiency  - Vitamin D, Ergocalciferol, (DRISDOL) 1.25 MG (50000 UNIT) CAPS capsule; Take 1 capsule by mouth once a week  Dispense: 12 capsule; Refill: 0  5. Influenza B  - azithromycin (ZITHROMAX) 250 MG tablet; Take 2 tablets on day 1, then 1 tablet daily on days 2 through 5  Dispense: 6 tablet; Refill: 0 - predniSONE (DELTASONE) 20 MG tablet; Take 1 tablet (20 mg total) by mouth daily with breakfast for 5 days.  Dispense: 5 tablet; Refill: 0 - benzonatate (TESSALON) 200 MG capsule; Take 1 capsule (200 mg total) by mouth 2 (two) times daily as needed for cough.  Dispense: 20 capsule; Refill: 0  Follow up:  Follow up as scheduled

## 2022-11-23 ENCOUNTER — Ambulatory Visit: Payer: BC Managed Care – PPO | Admitting: Family Medicine

## 2022-12-27 ENCOUNTER — Telehealth (HOSPITAL_COMMUNITY): Payer: Self-pay | Admitting: *Deleted

## 2022-12-27 ENCOUNTER — Non-Acute Institutional Stay (HOSPITAL_COMMUNITY)
Admission: AD | Admit: 2022-12-27 | Discharge: 2022-12-27 | Disposition: A | Discharge status: Home / Self Care | Payer: BC Managed Care – PPO | Attending: Internal Medicine | Admitting: Internal Medicine

## 2022-12-27 DIAGNOSIS — L309 Dermatitis, unspecified: Secondary | ICD-10-CM | POA: Diagnosis not present

## 2022-12-27 DIAGNOSIS — D638 Anemia in other chronic diseases classified elsewhere: Secondary | ICD-10-CM | POA: Insufficient documentation

## 2022-12-27 DIAGNOSIS — D5700 Hb-SS disease with crisis, unspecified: Secondary | ICD-10-CM | POA: Insufficient documentation

## 2022-12-27 DIAGNOSIS — G894 Chronic pain syndrome: Secondary | ICD-10-CM | POA: Diagnosis not present

## 2022-12-27 LAB — CBC WITH DIFFERENTIAL/PLATELET
Abs Immature Granulocytes: 0.31 10*3/uL — ABNORMAL HIGH (ref 0.00–0.07)
Basophils Absolute: 0.2 10*3/uL — ABNORMAL HIGH (ref 0.0–0.1)
Basophils Relative: 1 %
Eosinophils Absolute: 0.1 10*3/uL (ref 0.0–0.5)
Eosinophils Relative: 1 %
HCT: 25.4 % — ABNORMAL LOW (ref 36.0–46.0)
Hemoglobin: 8.3 g/dL — ABNORMAL LOW (ref 12.0–15.0)
Immature Granulocytes: 2 %
Lymphocytes Relative: 17 %
Lymphs Abs: 2.5 10*3/uL (ref 0.7–4.0)
MCH: 31 pg (ref 26.0–34.0)
MCHC: 32.7 g/dL (ref 30.0–36.0)
MCV: 94.8 fL (ref 80.0–100.0)
Monocytes Absolute: 1.2 10*3/uL — ABNORMAL HIGH (ref 0.1–1.0)
Monocytes Relative: 8 %
Neutro Abs: 10.6 10*3/uL — ABNORMAL HIGH (ref 1.7–7.7)
Neutrophils Relative %: 71 %
Platelets: 823 10*3/uL — ABNORMAL HIGH (ref 150–400)
RBC: 2.68 MIL/uL — ABNORMAL LOW (ref 3.87–5.11)
RDW: 19.2 % — ABNORMAL HIGH (ref 11.5–15.5)
WBC: 14.8 10*3/uL — ABNORMAL HIGH (ref 4.0–10.5)
nRBC: 3.4 % — ABNORMAL HIGH (ref 0.0–0.2)

## 2022-12-27 LAB — COMPREHENSIVE METABOLIC PANEL
ALT: 20 U/L (ref 0–44)
AST: 23 U/L (ref 15–41)
Albumin: 4.5 g/dL (ref 3.5–5.0)
Alkaline Phosphatase: 72 U/L (ref 38–126)
Anion gap: 8 (ref 5–15)
BUN: 7 mg/dL (ref 6–20)
CO2: 22 mmol/L (ref 22–32)
Calcium: 8.9 mg/dL (ref 8.9–10.3)
Chloride: 109 mmol/L (ref 98–111)
Creatinine, Ser: 0.42 mg/dL — ABNORMAL LOW (ref 0.44–1.00)
GFR, Estimated: >60 mL/min (ref >60)
Glucose, Bld: 93 mg/dL (ref 70–99)
Potassium: 4 mmol/L (ref 3.5–5.1)
Sodium: 139 mmol/L (ref 135–145)
Total Bilirubin: 2.5 mg/dL — ABNORMAL HIGH (ref 0.3–1.2)
Total Protein: 7.5 g/dL (ref 6.5–8.1)

## 2022-12-27 MED ORDER — NALOXONE HCL 0.4 MG/ML IJ SOLN: 0.4000 mg | INTRAMUSCULAR | Status: DC | PRN

## 2022-12-27 MED ORDER — SENNOSIDES-DOCUSATE SODIUM 8.6-50 MG PO TABS: 1.0000 | ORAL_TABLET | Freq: Two times a day (BID) | ORAL | Status: DC

## 2022-12-27 MED ORDER — DIPHENHYDRAMINE HCL 25 MG PO CAPS: 25.0000 mg | ORAL_CAPSULE | ORAL | Status: DC | PRN

## 2022-12-27 MED ORDER — ONDANSETRON HCL 4 MG/2ML IJ SOLN
4.0000 mg | Freq: Four times a day (QID) | INTRAMUSCULAR | Status: DC | PRN
  Administered 2022-12-27 (×2): 4 mg via INTRAVENOUS
  Filled 2022-12-27 (×2): qty 2

## 2022-12-27 MED ORDER — DEXTROSE-NACL 5-0.45 % IV SOLN: INTRAVENOUS | Status: DC

## 2022-12-27 MED ORDER — SODIUM CHLORIDE 0.9% FLUSH: 9.0000 mL | INTRAVENOUS | Status: DC | PRN

## 2022-12-27 MED ORDER — HYDROMORPHONE 1 MG/ML IV SOLN
INTRAVENOUS | Status: DC
  Administered 2022-12-27: 3.5 mg via INTRAVENOUS
  Administered 2022-12-27: 30 mg via INTRAVENOUS
  Filled 2022-12-27: qty 30

## 2022-12-27 MED ORDER — POLYETHYLENE GLYCOL 3350 17 G PO PACK: 17.0000 g | PACK | Freq: Every day | ORAL | Status: DC | PRN

## 2022-12-27 NOTE — Discharge Summary (Signed)
Physician Discharge Summary   Patient: Krista [name] MRN: [id number] DOB: May 06, 1986  Admit date:     12/27/2022  Discharge date:   Discharge Physician: Barbette Merino   PCP: Dorena Dew, FNP   Recommendations at discharge:   Patient resumes home regimen and follow-up with PCP.  Discharge Diagnoses: Principal Problem:   Sickle cell anemia with crisis [facility])  Resolved Problems:   * No resolved hospital problems. [facility] Course: Patient admitted to the sickle cell day hospital with sickle cell pain crisis.  Was treated with IV Dilaudid PCA, Toradol, IV fluids.  Patient responded to treatment and did better.  Subsequently discharged home at the end of the day after doing better.  Assessment and Plan: No notes have been filed under this hospital service. Service: Hospitalist       Consultants: None Procedures performed: None Disposition: Home Diet recommendation:  Regular diet DISCHARGE MEDICATION: Allergies as of 12/27/2022       Reactions   Keflex [cephalexin] Swelling   Face swelling        Medication List     TAKE these medications    aspirin EC 81 MG tablet Commonly known as: Aspirin Low Dose Take 1 tablet (81 mg total) by mouth daily. Swallow whole.   azaTHIOprine 50 MG tablet Commonly known as: IMURAN Take 50 mg by mouth daily.   benzonatate 200 MG capsule Commonly known as: TESSALON Take 1 capsule (200 mg total) by mouth 2 (two) times daily as needed for cough.   ciprofloxacin-dexamethasone OTIC suspension Commonly known as: Ciprodex Place 4 drops into the right ear 2 (two) times daily.   clobetasol ointment 0.05 % Commonly known as: TEMOVATE Apply 1 application topically 2 (two) times daily.   folic acid 1 MG tablet Commonly known as: FOLVITE Take 1 tablet (1 mg total) by mouth daily.   ibuprofen 800 MG tablet Commonly known as: ADVIL Take 1 tablet (800 mg total) by mouth every 8 (eight) hours as needed.   Mirena (52 MG) 20  MCG/DAY Iud Generic drug: levonorgestrel Mirena 20 mcg/24 hours (7 yrs) 52 mg intrauterine device  Take 1 device by intrauterine route.   oxycodone 5 MG capsule Commonly known as: OXY-IR Take 1 capsule (5 mg total) by mouth every 4 (four) hours as needed.   Sarna lotion Generic drug: camphor-menthol Apply 1 application topically as needed for itching.   triamcinolone ointment 0.5 % Commonly known as: KENALOG Apply 1 application topically 2 (two) times daily.   Vitamin D (Ergocalciferol) 1.25 MG (50000 UNIT) Caps capsule Commonly known as: DRISDOL Take 1 capsule by mouth once a week        Discharge Exam: There were no vitals filed for this visit. Constitutional: NAD, calm, comfortable Eyes: PERRL, lids and conjunctivae normal ENMT: Mucous membranes are moist. Posterior pharynx clear of any exudate or lesions.Normal dentition.  Neck: normal, supple, no masses, no thyromegaly Respiratory: clear to auscultation bilaterally, no wheezing, no crackles. Normal respiratory effort. No accessory muscle use.  Cardiovascular: Regular rate and rhythm, no murmurs / rubs / gallops. No extremity edema. 2+ pedal pulses. No carotid bruits.  Abdomen: no tenderness, no masses palpated. No hepatosplenomegaly. Bowel sounds positive.  Musculoskeletal: Good range of motion, no joint swelling or tenderness, Skin: no rashes, lesions, ulcers. No induration Neurologic: CN 2-12 grossly intact. Sensation intact, DTR normal. Strength 5/5 in all 4.  Psychiatric: Normal judgment and insight. Alert and oriented x 3. Normal mood   Condition at discharge: good  The results of significant diagnostics from this hospitalization (including imaging, microbiology, ancillary and laboratory) are listed below for reference.   Imaging Studies: No results found.  Microbiology: Results for orders placed or performed during the hospital encounter of 01/12/21  Urine culture     Status: None   Collection Time:  01/12/21 11:09 AM   Specimen: Urine, Random  Result Value Ref Range Status   Specimen Description   Final    URINE, RANDOM Performed at [facility] [address] [place], [place] [address]    Special Requests   Final    NONE Performed at [facility], [place] [address] [place], [place] [address]    Culture   Final    NO GROWTH Performed at [facility], [place] [address] [place], [place] [address]    Report Status 01/14/2021 FINAL  Final  SARS CORONAVIRUS 2 (TAT 6-24 HRS) Nasopharyngeal Nasopharyngeal Swab     Status: None   Collection Time: 01/12/21  5:27 PM   Specimen: Nasopharyngeal Swab  Result Value Ref Range Status   SARS Coronavirus 2 NEGATIVE NEGATIVE Final    Comment: (NOTE) SARS-CoV-2 target nucleic acids are NOT DETECTED.  The SARS-CoV-2 RNA is generally detectable in upper and lower respiratory specimens during the acute phase of infection. Negative results do not preclude SARS-CoV-2 infection, do not rule out co-infections with other pathogens, and should not be used as the sole basis for treatment or other patient management decisions. Negative results must be combined with clinical observations, patient history, and epidemiological information. The expected result is Negative.  Fact Sheet for Patients: SugarRoll.be  Fact Sheet for Healthcare Providers: https://www.woods-mathews.com/  This test is not yet approved or cleared by the [place] FDA and  has been authorized for detection and/or diagnosis of SARS-CoV-2 by FDA under an Emergency Use Authorization (EUA). This EUA will remain  in effect (meaning this test can be used) for the duration of the COVID-19 declaration under Se ction 564(b)(1) of the Act, 21 U.S.C. section 360bbb-3(b)(1), unless the authorization is terminated or revoked sooner.  Performed at [facility], [place] [address] [place],  [place] [address]     Labs: CBC: Recent Labs  Lab 12/27/22 0915  WBC 14.8*  NEUTROABS 10.6*  HGB 8.3*  HCT 25.4*  MCV 94.8  PLT [id number]*   Basic Metabolic Panel: Recent Labs  Lab 12/27/22 0915  NA 139  K 4.0  CL 109  CO2 22  GLUCOSE 93  BUN 7  CREATININE 0.42*  CALCIUM 8.9   Liver Function Tests: Recent Labs  Lab 12/27/22 0915  AST 23  ALT 20  ALKPHOS 72  BILITOT 2.5*  PROT 7.5  ALBUMIN 4.5   CBG: No results for input(s): "GLUCAP" in the last 168 hours.  Discharge time spent: less than patient 30 minutes.  SignedBarbette Merino, MD [facility] 12/28/2022

## 2022-12-27 NOTE — Progress Notes (Signed)
Patient admitted to the day infusion hospital for sickle cell pain. Initially, patient reported generalized pain rated 5/10. For pain management, patient placed on Sickle Cell Dose Dilaudid PCA and hydrated with IV fluids. At discharge, patient rated pain at 0/10. Vital signs stable. Printed AVS offered but patient refused. Patient alert, oriented and ambulatory at discharge.

## 2022-12-27 NOTE — Telephone Encounter (Signed)
Patient called requesting to come to the day hospital for sickle cell pain. Patient reports generalized pain rated 5/10. Reports taking Ibuprofen last night and Oxycodone on Saturday for pain. COVID-19 screening done and patient denies all symptoms and exposures. Denies fever, chest pain, nausea, vomiting and diarrhea. Dr. Jonelle Sidle notified. Patient can come to the day hospital for pain management. Patient advised and expresses an understanding.

## 2022-12-27 NOTE — H&P (Signed)
History and Physical    Patient: Krista [name] [id number] DOB: [birth year] DOA: 12/27/2022 DOS: the patient was seen and examined on 12/27/2022 PCP: Dorena Dew, FNP  Patient coming from: Home  Chief Complaint: No chief complaint on file.  HPI: Krista [name] is a 37 y.o. female with medical history significant of sickle cell disease, eczema, anemia of chronic disease, who presents to the patient care center with generalized pain currently rated as 5 out of 10.  Pain is consistent with her typical sickle cell crisis.  Patient has taken home regimen but no relief.  She is worried about the pain and came to the ER for evaluation..  She denied any fever or chills.  Denied any nausea vomiting or diarrhea.  Patient is being admitted for management of her pain crisis.  Review of Systems: As mentioned in the history of present illness. All other systems reviewed and are negative. Past Medical History:  Diagnosis Date   Chronic pain    with sickle cell    Eczema    Heart murmur    07-13-2019  per pt since childhood,  has not ever had a echo done ,  asymptomatic   History of blood transfusion    x3    Missed ab    Sickle cell anemia ([place])    followed by pcp---  last crisis 12-14-2018   Vitamin D deficiency    Wears glasses    Past Surgical History:  Procedure Laterality Date   EYE SURGERY Left 11/12/2019   LAPAROSCOPIC CHOLECYSTECTOMY  1997   Social History:  reports that she has never smoked. She has never used smokeless tobacco. She reports that she does not currently use alcohol. She reports that she does not use drugs.  Allergies  Allergen Reactions   Keflex [Cephalexin] Swelling    Face swelling    Family History  Problem Relation Age of Onset   Diabetes Father    Hypertension Father    Heart disease Brother    Cancer Maternal Grandmother    Heart disease Brother    Breast cancer Paternal Aunt    Cancer Paternal Uncle     Prior to Admission medications    Medication Sig Start Date End Date Taking? Authorizing Provider  aspirin EC (ASPIRIN LOW DOSE) 81 MG tablet Take 1 tablet (81 mg total) by mouth daily. Swallow whole. 11/11/22  Yes Fenton Foy, NP  folic acid (FOLVITE) 1 MG tablet Take 1 tablet (1 mg total) by mouth daily. 11/02/22  Yes Dorena Dew, FNP  ibuprofen (ADVIL) 800 MG tablet Take 1 tablet (800 mg total) by mouth every 8 (eight) hours as needed. 07/24/21  Yes Dorena Dew, FNP  oxycodone (OXY-IR) 5 MG capsule Take 1 capsule (5 mg total) by mouth every 4 (four) hours as needed. 07/24/21  Yes Dorena Dew, FNP  Vitamin D, Ergocalciferol, (DRISDOL) 1.25 MG (50000 UNIT) CAPS capsule Take 1 capsule by mouth once a week 11/11/22  Yes Fenton Foy, NP  azaTHIOprine (IMURAN) 50 MG tablet Take 50 mg by mouth daily.    [provider]  benzonatate (TESSALON) 200 MG capsule Take 1 capsule (200 mg total) by mouth 2 (two) times daily as needed for cough. 11/11/22   Fenton Foy, NP  camphor-menthol [facility]) lotion Apply 1 application topically as needed for itching. Patient not taking: Reported on 05/05/2022 04/14/21   Dorena Dew, FNP  ciprofloxacin-dexamethasone (CIPRODEX) OTIC suspension Place 4 drops into the right ear  2 (two) times daily. 04/23/22   Talbot Grumbling, FNP  clobetasol ointment (TEMOVATE) [id number] % Apply 1 application topically 2 (two) times daily. 11/03/21   Lavonna Monarch, MD  levonorgestrel (MIRENA, 52 MG,) 20 MCG/DAY IUD Mirena 20 mcg/24 hours (7 yrs) 52 mg intrauterine device  Take 1 device by intrauterine route.    [provider]  triamcinolone ointment (KENALOG) 0.5 % Apply 1 application topically 2 (two) times daily. 03/02/21   Dorena Dew, FNP    Physical Exam: Vitals:   12/27/22 0918  BP: 116/70  Pulse: 76  Resp: 16  Temp: 97.7 F (36.5 C)  TempSrc: Temporal  SpO2: 97%   Constitutional: Acutely ill looking, NAD, calm, comfortable Eyes: PERRL, lids and  conjunctivae normal ENMT: Mucous membranes are moist. Posterior pharynx clear of any exudate or lesions.Normal dentition.  Neck: normal, supple, no masses, no thyromegaly Respiratory: clear to auscultation bilaterally, no wheezing, no crackles. Normal respiratory effort. No accessory muscle use.  Cardiovascular: Regular rate and rhythm, no murmurs / rubs / gallops. No extremity edema. 2+ pedal pulses. No carotid bruits.  Abdomen: no tenderness, no masses palpated. No hepatosplenomegaly. Bowel sounds positive.  Musculoskeletal: Good range of motion, no joint swelling or tenderness, Skin: no rashes, lesions, ulcers. No induration Neurologic: CN 2-12 grossly intact. Sensation intact, DTR normal. Strength 5/5 in all 4.  Psychiatric: Normal judgment and insight. Alert and oriented x 3. Normal mood  Data Reviewed:  Results are pending, will review when available.  Assessment and Plan:  #1 sickle cell pain crisis: Patient will be admitted and initiated on Dilaudid PCA.  Initiate D5 half-normal at 125 cc an hour, weight-based Dilaudid PCA and Toradol 15 mg every 6 as needed.  Reassess pain closely.  Monitor response.  Hope to discharge patient home at the end of the day.  #2 anemia of chronic disease: Will check CBC to monitor hemoglobin.  #3 chronic pain syndrome: Patient to resume her chronic home regimen at discharge.     Advance Care Planning:   Code Status: Prior   Consults: None  Family Communication: No family at bedside  Severity of Illness: The appropriate patient status for this patient is OBSERVATION. Observation status is judged to be reasonable and necessary in order to provide the required intensity of service to ensure the patient's safety. The patient's presenting symptoms, physical exam findings, and initial radiographic and laboratory data in the context of their medical condition is felt to place them at decreased risk for further clinical deterioration. Furthermore, it is  anticipated that the patient will be medically stable for discharge from the hospital within 2 midnights of admission.   AuthorBarbette Merino, MD 12/27/2022 9:22 AM  For on call review [email or web address].

## 2022-12-28 NOTE — Hospital Course (Signed)
Patient admitted to the sickle cell day hospital with sickle cell pain crisis.  Was treated with IV Dilaudid PCA, Toradol, IV fluids.  Patient responded to treatment and did better.  Subsequently discharged home at the end of the day after doing better.

## 2023-01-10 ENCOUNTER — Ambulatory Visit (INDEPENDENT_AMBULATORY_CARE_PROVIDER_SITE_OTHER): Payer: BC Managed Care – PPO | Admitting: Nurse Practitioner

## 2023-01-10 ENCOUNTER — Encounter: Payer: Self-pay | Admitting: Nurse Practitioner

## 2023-01-10 VITALS — BP 106/54 | HR 87 | Temp 97.8°F | Ht 67.0 in | Wt 169.6 lb

## 2023-01-10 DIAGNOSIS — D571 Sickle-cell disease without crisis: Secondary | ICD-10-CM

## 2023-01-10 NOTE — Progress Notes (Signed)
@Patient  ID: Krista [name], female    DOB: 08-May-1986, 37 y.o.   MRN: [id number]  Chief Complaint  Patient presents with   Sickle Cell Anemia    Follow up    Referring provider: Dorena Dew, FNP   HPI  Krista [name] is a 37 year old female with a medical history significant for sickle cell disease, chronic pain syndrome, vitamin D deficiency, and history of anemia of chronic disease presents for follow-up of chronic conditions.   Patient presents today for a sickle cell follow-up.  She did recently have an admission on 12/27/2022 for sickle cell crisis.  She states that the Dilaudid PCA actually made her very nauseated.  She is trying to control her sickle cell pain at home with ibuprofen.  She does take oxycodone but tries not to take this.  She has been managing her sickle cell well at home but states that it seems like over the past couple years it is worsening.  She will also stay as healthy as possible.  She does have 2 small children ages 88 and 3 at home and states that her sickle cell did get worse after having children.  We discussed that we can refer her to hematology with [facility] to discuss gene therapy treatment.  We also discussed patient's case with the [place] Hollis her PCP.  We discussed starting adakveo infusions once monthly.  Patient does currently take folic acid and vitamin D supplements. Denies f/c/s, n/v/d, hemoptysis, PND, leg swelling Denies chest pain or edema        Allergies  Allergen Reactions   Keflex [Cephalexin] Swelling    Face swelling    Immunization History  Administered Date(s) Administered   Influenza,inj,Quad PF,6+ Mos 08/12/2015, 10/15/2016, 06/08/2017, 08/21/2019, 08/26/2020, 07/14/2021   PFIZER Comirnaty(Gray Top)Covid-19 Tri-Sucrose Vaccine 07/05/2020, 07/26/2020   PPD Test 05/22/2015   Pneumococcal Polysaccharide-23 08/12/2015, 05/06/2018, 04/14/2021   Tdap 03/06/2020    Past Medical History:  Diagnosis Date   Chronic pain     with sickle cell    Eczema    Heart murmur    07-13-2019  per pt since childhood,  has not ever had a echo done ,  asymptomatic   History of blood transfusion    x3    Missed ab    Sickle cell anemia ([place])    followed by pcp---  last crisis 12-14-2018   Vitamin D deficiency    Wears glasses     Tobacco History: Social History   Tobacco Use  Smoking Status Never  Smokeless Tobacco Never   Counseling given: Not Answered   Outpatient Encounter Medications as of 01/10/2023  Medication Sig   aspirin EC (ASPIRIN LOW DOSE) 81 MG tablet Take 1 tablet (81 mg total) by mouth daily. Swallow whole.   azaTHIOprine (IMURAN) 50 MG tablet Take 50 mg by mouth daily.   folic acid (FOLVITE) 1 MG tablet Take 1 tablet (1 mg total) by mouth daily.   ibuprofen (ADVIL) 800 MG tablet Take 1 tablet (800 mg total) by mouth every 8 (eight) hours as needed.   levonorgestrel (MIRENA, 52 MG,) 20 MCG/DAY IUD Mirena 20 mcg/24 hours (7 yrs) 52 mg intrauterine device  Take 1 device by intrauterine route.   oxycodone (OXY-IR) 5 MG capsule Take 1 capsule (5 mg total) by mouth every 4 (four) hours as needed.   Vitamin D, Ergocalciferol, (DRISDOL) 1.25 MG (50000 UNIT) CAPS capsule Take 1 capsule by mouth once a week   benzonatate (TESSALON) 200 MG capsule  Take 1 capsule (200 mg total) by mouth 2 (two) times daily as needed for cough. (Patient not taking: Reported on 01/10/2023)   camphor-menthol (SARNA) lotion Apply 1 application topically as needed for itching. (Patient not taking: Reported on 05/05/2022)   clobetasol ointment (TEMOVATE) [id number]-[id number] % Apply 1 application topically 2 (two) times daily.   triamcinolone ointment (KENALOG) 0.5 % Apply 1 application topically 2 (two) times daily.   [DISCONTINUED] ciprofloxacin-dexamethasone (CIPRODEX) OTIC suspension Place 4 drops into the right ear 2 (two) times daily. (Patient not taking: Reported on 01/10/2023)   No facility-administered encounter medications on file as of  01/10/2023.     Review of Systems  Review of Systems  Constitutional: Negative.   HENT: Negative.    Cardiovascular: Negative.   Gastrointestinal: Negative.   Allergic/Immunologic: Negative.   Neurological: Negative.   Psychiatric/Behavioral: Negative.         Physical Exam  BP (!) 106/54   Pulse 87   Temp 97.8 F (36.6 C)   Ht 5\' 7"  (1.702 m)   Wt 169 lb 9.6 oz (76.9 kg)   LMP 12/27/2022   SpO2 98%   BMI 26.56 kg/m   Wt Readings from Last 5 Encounters:  01/10/23 169 lb 9.6 oz (76.9 kg)  11/11/22 167 lb (75.8 kg)  05/05/22 168 lb 9.6 oz (76.5 kg)  02/26/22 167 lb 12.8 oz (76.1 kg)  08/25/21 168 lb (76.2 kg)     Physical Exam Vitals and nursing note reviewed.  Constitutional:      General: She is not in acute distress.    Appearance: She is well-developed.  Cardiovascular:     Rate and Rhythm: Normal rate and regular rhythm.  Pulmonary:     Effort: Pulmonary effort is normal.     Breath sounds: Normal breath sounds.  Neurological:     Mental Status: She is alert and oriented to person, place, and time.      Lab Results:  CBC    Component Value Date/Time   WBC 14.8 (H) 12/27/2022 0915   RBC 2.68 (L) 12/27/2022 0915   HGB 8.3 (L) 12/27/2022 0915   HGB 8.6 (L) 05/05/2022 1059   HCT 25.4 (L) 12/27/2022 0915   HCT 25.8 (L) 05/05/2022 1059   PLT 823 (H) 12/27/2022 0915   PLT 727 (H) 05/05/2022 1059   MCV 94.8 12/27/2022 0915   MCV 94 05/05/2022 1059   MCH 31.0 12/27/2022 0915   MCHC 32.7 12/27/2022 0915   RDW 19.2 (H) 12/27/2022 0915   RDW 15.3 05/05/2022 1059   LYMPHSABS 2.5 12/27/2022 0915   LYMPHSABS 2.8 05/05/2022 1059   MONOABS 1.2 (H) 12/27/2022 0915   EOSABS 0.1 12/27/2022 0915   EOSABS 0.1 05/05/2022 1059   BASOSABS 0.2 (H) 12/27/2022 0915   BASOSABS 0.1 05/05/2022 1059    BMET    Component Value Date/Time   NA 139 12/27/2022 0915   NA 143 05/05/2022 1059   K 4.0 12/27/2022 0915   CL 109 12/27/2022 0915   CO2 22 12/27/2022  0915   GLUCOSE 93 12/27/2022 0915   BUN 7 12/27/2022 0915   BUN 6 05/05/2022 1059   CREATININE 0.42 (L) 12/27/2022 0915   CREATININE 0.51 03/24/2017 1501   CALCIUM 8.9 12/27/2022 0915   GFRNONAA >60 12/27/2022 0915   GFRNONAA >89 03/24/2017 1501   GFRAA 140 11/11/2020 1027   GFRAA >89 03/24/2017 1501    BNP No results found for: "BNP"  ProBNP No results found for: "PROBNP"  Imaging:  No results found.   Assessment & Plan:   Hb-SS disease without crisis ([place]) - Sickle Cell Panel - Ambulatory referral to Hematology / Oncology - gene therapy evaluation - Lovett Sox will follow with patient to start Adakveo infusions  Follow up:  Follow up in 3 months or sooner if needed with Lind Guest, NP 01/10/2023

## 2023-01-10 NOTE — Assessment & Plan Note (Signed)
-   [name] Cell Panel - Ambulatory referral to Hematology / Oncology - gene therapy evaluation - Krista [name] will follow with patient to start Adakveo infusions  Follow up:  Follow up in 3 months or sooner if needed with Krista [name]

## 2023-01-10 NOTE — Patient Instructions (Addendum)
1. Hb-SS disease without crisis ([place])  - Sickle Cell Panel - Ambulatory referral to Hematology / Oncology - gene therapy evaluation - Lovett Sox will follow with patient to start Adakveo infusions  Follow up:  Follow up in 3 months or sooner if needed with [place] Hollis   Crizanlizumab Injection What is this medication? CRIZANLIZUMAB (KRIZ an LIZ ue mab) prevents the symptoms of sickle cell disease, such as pain crises and acute chest syndrome. It works by preventing blood cells from clumping together. This increases blood flow and the amount of oxygen that gets to your tissues. It is a monoclonal antibody. This medicine may be used for other purposes; ask your health care provider or pharmacist if you have questions. COMMON BRAND NAME(S): ADAKVEO What should I tell my care team before I take this medication? They need to know if you have any of these conditions: An unusual or allergic reaction to crizanlizumab, other medications, foods, dyes or preservatives Pregnant or trying to get pregnant Breast-feeding How should I use this medication? This medication is injected into a vein. It is given by your care team in a hospital or clinic setting. Talk to your care team about the use of this medication in children. While it may be prescribed for children as young as 16 years for selected conditions, precautions do apply. Overdosage: If you think you have taken too much of this medicine contact a poison control center or emergency room at once. NOTE: This medicine is only for you. Do not share this medicine with others. What if I miss a dose? Keep appointments for follow-up doses. It is important not to miss your dose. Call your care team if you are unable to keep an appointment. What may interact with this medication? Interactions are not expected. This list may not describe all possible interactions. Give your health care provider a list of all the medicines, herbs, non-prescription drugs,  or dietary supplements you use. Also tell them if you smoke, drink alcohol, or use illegal drugs. Some items may interact with your medicine. What should I watch for while using this medication? Your condition will be monitored carefully while you are receiving this medication. What side effects may I notice from receiving this medication? Side effects that you should report to your care team as soon as possible: Allergic reactions--skin rash, itching, hives, swelling of the face, lips, tongue, or throat Infusion reactions--chest pain, shortness of breath or trouble breathing, feeling faint or lightheaded Side effects that usually do not require medical attention (report these to your care team if they continue or are bothersome): Back pain Fever Joint pain Nausea Stomach pain This list may not describe all possible side effects. Call your doctor for medical advice about side effects. You may report side effects to FDA at 1-800-FDA-1088. Where should I keep my medication? This medication is given in a hospital or clinic. It will not be stored at home. NOTE: This sheet is a summary. It may not cover all possible information. If you have questions about this medicine, talk to your doctor, pharmacist, or health care provider.  2023 Elsevier/Gold Standard (2021-11-23 00:00:00) 1. Hb-SS disease without crisis ([place])  - Sickle Cell Panel - Ambulatory referral to Hematology / Oncology   Follow up:  Follow up in 3 months with [place]

## 2023-01-11 ENCOUNTER — Telehealth: Payer: Self-pay | Admitting: Hematology and Oncology

## 2023-01-11 LAB — CMP14+CBC/D/PLT+FER+RETIC+V...
ALT: 16 IU/L (ref 0–32)
AST: 19 IU/L (ref 0–40)
Albumin/Globulin Ratio: 2 (ref 1.2–2.2)
Albumin: 4.7 g/dL (ref 3.9–4.9)
Alkaline Phosphatase: 88 IU/L (ref 44–121)
BUN/Creatinine Ratio: 11 (ref 9–23)
BUN: 6 mg/dL (ref 6–20)
Basophils Absolute: 0.2 10*3/uL (ref 0.0–0.2)
Basos: 1 %
Bilirubin Total: 2.4 mg/dL — ABNORMAL HIGH (ref 0.0–1.2)
CO2: 20 mmol/L (ref 20–29)
Calcium: 10 mg/dL (ref 8.7–10.2)
Chloride: 106 mmol/L (ref 96–106)
Creatinine, Ser: 0.53 mg/dL — ABNORMAL LOW (ref 0.57–1.00)
EOS (ABSOLUTE): 0.1 10*3/uL (ref 0.0–0.4)
Eos: 1 %
Ferritin: 494 ng/mL — ABNORMAL HIGH (ref 15–150)
Globulin, Total: 2.3 g/dL (ref 1.5–4.5)
Glucose: 97 mg/dL (ref 70–99)
Hematocrit: 27.4 % — ABNORMAL LOW (ref 34.0–46.6)
Hemoglobin: 9.2 g/dL — ABNORMAL LOW (ref 11.1–15.9)
Immature Grans (Abs): 0.2 10*3/uL — ABNORMAL HIGH (ref 0.0–0.1)
Immature Granulocytes: 1 %
Lymphocytes Absolute: 2.8 10*3/uL (ref 0.7–3.1)
Lymphs: 19 %
MCH: 31.4 pg (ref 26.6–33.0)
MCHC: 33.6 g/dL (ref 31.5–35.7)
MCV: 94 fL (ref 79–97)
Monocytes Absolute: 1.3 10*3/uL — ABNORMAL HIGH (ref 0.1–0.9)
Monocytes: 9 %
NRBC: 5 % — ABNORMAL HIGH (ref 0–0)
Neutrophils Absolute: 10 10*3/uL — ABNORMAL HIGH (ref 1.4–7.0)
Neutrophils: 69 %
Platelets: 717 10*3/uL — ABNORMAL HIGH (ref 150–450)
Potassium: 4.9 mmol/L (ref 3.5–5.2)
RBC: 2.93 x10E6/uL — ABNORMAL LOW (ref 3.77–5.28)
RDW: 15.9 % — ABNORMAL HIGH (ref 11.7–15.4)
Retic Ct Pct: 17 % — ABNORMAL HIGH (ref 0.6–2.6)
Sodium: 141 mmol/L (ref 134–144)
Total Protein: 7 g/dL (ref 6.0–8.5)
Vit D, 25-Hydroxy: 44 ng/mL (ref 30.0–100.0)
WBC: 14.5 10*3/uL — ABNORMAL HIGH (ref 3.4–10.8)
eGFR: 123 mL/min/{1.73_m2} (ref >59)

## 2023-01-11 NOTE — Telephone Encounter (Signed)
scheduled per 3/25 referral , pt has been called and confirmed date and time. Pt is aware of location and to arrive early for check in

## 2023-01-13 NOTE — Progress Notes (Signed)
Pt seen in my chart. [place]

## 2023-01-19 ENCOUNTER — Other Ambulatory Visit: Payer: Self-pay | Admitting: Family Medicine

## 2023-01-19 DIAGNOSIS — D571 Sickle-cell disease without crisis: Secondary | ICD-10-CM

## 2023-01-19 MED ORDER — HYDROXYUREA 500 MG PO CAPS: 500.0000 mg | ORAL_CAPSULE | Freq: Every day | ORAL | 1 refills | Status: DC

## 2023-01-19 NOTE — Progress Notes (Signed)
Meds ordered this encounter  Medications   hydroxyurea (HYDREA) 500 MG capsule    Sig: Take 1 capsule (500 mg total) by mouth daily. May take with food to minimize GI side effects.    Dispense:  90 capsule    Refill:  1    Order Specific Question:   Supervising Provider    Answer:   Tresa Garter [id number]   Orders Placed This Encounter  Procedures   CBC with Differential    Standing Status:   Future    Standing Expiration Date:   01/19/2024     [name]  APRN, MSN, FNP-C [name] [address] [place], [place] [address] [phone]

## 2023-01-30 ENCOUNTER — Other Ambulatory Visit: Payer: Self-pay | Admitting: Family Medicine

## 2023-01-30 DIAGNOSIS — D571 Sickle-cell disease without crisis: Secondary | ICD-10-CM

## 2023-01-31 NOTE — Telephone Encounter (Signed)
Please advise if I am able to fill this . KH

## 2023-02-02 ENCOUNTER — Telehealth: Payer: Self-pay | Admitting: *Deleted

## 2023-02-02 NOTE — Telephone Encounter (Signed)
TCT patient regarding her appt for tomorrow. Spoke with her. Per Dr. Leonides Schanz, pt actually needed to be referred to [facility] for potential gene therapy for her Sickle Cell Disease. Advised she call her FNP to have the appropriate referral made. Pt voiced understanding.  Appts for tomorrow have been cancelled.

## 2023-02-03 ENCOUNTER — Inpatient Hospital Stay: Payer: BC Managed Care – PPO | Admitting: Hematology and Oncology

## 2023-02-03 ENCOUNTER — Inpatient Hospital Stay: Payer: BC Managed Care – PPO

## 2023-02-19 ENCOUNTER — Other Ambulatory Visit: Payer: Self-pay | Admitting: Nurse Practitioner

## 2023-02-19 DIAGNOSIS — E559 Vitamin D deficiency, unspecified: Secondary | ICD-10-CM

## 2023-02-21 NOTE — Telephone Encounter (Signed)
Please advise KH 

## 2023-03-11 ENCOUNTER — Ambulatory Visit (INDEPENDENT_AMBULATORY_CARE_PROVIDER_SITE_OTHER): Payer: BC Managed Care – PPO | Admitting: Nurse Practitioner

## 2023-03-11 ENCOUNTER — Telehealth: Payer: Self-pay

## 2023-03-11 ENCOUNTER — Encounter: Payer: Self-pay | Admitting: Nurse Practitioner

## 2023-03-11 VITALS — BP 113/81 | HR 84 | Temp 97.8°F | Wt 171.4 lb

## 2023-03-11 DIAGNOSIS — E663 Overweight: Secondary | ICD-10-CM | POA: Insufficient documentation

## 2023-03-11 MED ORDER — WEGOVY 0.25 MG/0.5ML ~~LOC~~ SOAJ: 0.2500 mg | SUBCUTANEOUS | 0 refills | Status: AC

## 2023-03-11 NOTE — Telephone Encounter (Signed)
WEGOVY PRIOR AUTH SUBMITTED TO INS VIA [employer] Key: [id number]

## 2023-03-11 NOTE — Assessment & Plan Note (Signed)
-   Semaglutide-Weight Management (WEGOVY) 0.25 MG/0.5ML SOAJ; Inject 0.25 mg into the skin once a week.  Dispense: 2 mL; Refill: 0   Follow up:  Follow up in 3 months

## 2023-03-11 NOTE — Progress Notes (Signed)
@Patient  ID: Krista [name], female    DOB: 06-13-1986, 37 y.o.   MRN: [id number]  Chief Complaint  Patient presents with   Weight Loss    Referring provider: Massie Maroon, FNP   HPI  Patient presents today for weight loss.  We discussed that she is not considered obese with a BMI of 26.85.  She is mildly overweight.  She is more concerned with Denies f/c/s, n/v/d, hemoptysis, PND, leg swelling Denies chest pain or edema  the weight around her waistline.  She would like to try [facility].  We will try to order this for the patient but we discussed that insurance most likely will not cover this.  If the medication is not covered by insurance we will refer her to medical weight management.  Patient does not work out.  She does try to eat healthy.     Allergies  Allergen Reactions   Keflex [Cephalexin] Swelling    Face swelling    Immunization History  Administered Date(s) Administered   Influenza,inj,Quad PF,6+ Mos 08/12/2015, 10/15/2016, 06/08/2017, 08/21/2019, 08/26/2020, 07/14/2021   PFIZER Comirnaty(Gray Top)Covid-19 Tri-Sucrose Vaccine 07/05/2020, 07/26/2020   PPD Test 05/22/2015   Pneumococcal Polysaccharide-23 08/12/2015, 05/06/2018, 04/14/2021   Tdap 03/06/2020    Past Medical History:  Diagnosis Date   Chronic pain    with sickle cell    Eczema    Heart murmur    07-13-2019  per pt since childhood,  has not ever had a echo done ,  asymptomatic   History of blood transfusion    x3    Missed ab    Sickle cell anemia ([facility])    followed by pcp---  last crisis 12-14-2018   Vitamin D deficiency    Wears glasses     Tobacco History: Social History   Tobacco Use  Smoking Status Never  Smokeless Tobacco Never   Counseling given: Not Answered   Outpatient Encounter Medications as of 03/11/2023  Medication Sig   Semaglutide-Weight Management (WEGOVY) 0.25 MG/0.5ML SOAJ Inject 0.25 mg into the skin once a week.   aspirin EC (ASPIRIN LOW DOSE) 81 MG tablet  Take 1 tablet (81 mg total) by mouth daily. Swallow whole.   azaTHIOprine (IMURAN) 50 MG tablet Take 50 mg by mouth daily.   benzonatate (TESSALON) 200 MG capsule Take 1 capsule (200 mg total) by mouth 2 (two) times daily as needed for cough. (Patient not taking: Reported on 01/10/2023)   camphor-menthol (SARNA) lotion Apply 1 application topically as needed for itching. (Patient not taking: Reported on 05/05/2022)   clobetasol ointment (TEMOVATE) 0.05 % Apply 1 application topically 2 (two) times daily.   folic acid (FOLVITE) 1 MG tablet Take 1 tablet by mouth once daily   hydroxyurea (HYDREA) 500 MG capsule Take 1 capsule (500 mg total) by mouth daily. May take with food to minimize GI side effects.   ibuprofen (ADVIL) 800 MG tablet Take 1 tablet (800 mg total) by mouth every 8 (eight) hours as needed.   levonorgestrel (MIRENA, 52 MG,) 20 MCG/DAY IUD Mirena 20 mcg/24 hours (7 yrs) 52 mg intrauterine device  Take 1 device by intrauterine route.   oxycodone (OXY-IR) 5 MG capsule Take 1 capsule (5 mg total) by mouth every 4 (four) hours as needed.   triamcinolone ointment (KENALOG) 0.5 % Apply 1 application topically 2 (two) times daily.   Vitamin D, Ergocalciferol, (DRISDOL) 1.25 MG (50000 UNIT) CAPS capsule Take 1 capsule by mouth once a week   No facility-administered  encounter medications on file as of 03/11/2023.     Review of Systems  Review of Systems  Constitutional: Negative.   HENT: Negative.    Cardiovascular: Negative.   Gastrointestinal: Negative.   Allergic/Immunologic: Negative.   Neurological: Negative.   Psychiatric/Behavioral: Negative.         Physical Exam  BP 113/81   Pulse 84   Temp 97.8 F (36.6 C)   Wt 171 lb 6.4 oz (77.7 kg)   SpO2 100%   BMI 26.85 kg/m   Wt Readings from Last 5 Encounters:  03/11/23 171 lb 6.4 oz (77.7 kg)  01/10/23 169 lb 9.6 oz (76.9 kg)  11/11/22 167 lb (75.8 kg)  05/05/22 168 lb 9.6 oz (76.5 kg)  02/26/22 167 lb 12.8 oz (76.1  kg)     Physical Exam Vitals and nursing note reviewed.  Constitutional:      General: She is not in acute distress.    Appearance: She is well-developed.  Cardiovascular:     Rate and Rhythm: Normal rate and regular rhythm.  Pulmonary:     Effort: Pulmonary effort is normal.     Breath sounds: Normal breath sounds.  Neurological:     Mental Status: She is alert and oriented to person, place, and time.      Lab Results:  CBC    Component Value Date/Time   WBC 14.5 (H) 01/10/2023 1044   WBC 14.8 (H) 12/27/2022 0915   RBC 2.93 (L) 01/10/2023 1044   RBC 2.68 (L) 12/27/2022 0915   HGB 9.2 (L) 01/10/2023 1044   HCT 27.4 (L) 01/10/2023 1044   PLT 717 (H) 01/10/2023 1044   MCV 94 01/10/2023 1044   MCH 31.4 01/10/2023 1044   MCH 31.0 12/27/2022 0915   MCHC 33.6 01/10/2023 1044   MCHC 32.7 12/27/2022 0915   RDW 15.9 (H) 01/10/2023 1044   LYMPHSABS 2.8 01/10/2023 1044   MONOABS 1.2 (H) 12/27/2022 0915   EOSABS 0.1 01/10/2023 1044   BASOSABS 0.2 01/10/2023 1044    BMET    Component Value Date/Time   NA 141 01/10/2023 1044   K 4.9 01/10/2023 1044   CL 106 01/10/2023 1044   CO2 20 01/10/2023 1044   GLUCOSE 97 01/10/2023 1044   GLUCOSE 93 12/27/2022 0915   BUN 6 01/10/2023 1044   CREATININE 0.53 (L) 01/10/2023 1044   CREATININE 0.51 03/24/2017 1501   CALCIUM 10.0 01/10/2023 1044   GFRNONAA >60 12/27/2022 0915   GFRNONAA >89 03/24/2017 1501   GFRAA 140 11/11/2020 1027   GFRAA >89 03/24/2017 1501     Assessment & Plan:   Overweight (BMI 25.0-29.9) - Semaglutide-Weight Management (WEGOVY) 0.25 MG/0.5ML SOAJ; Inject 0.25 mg into the skin once a week.  Dispense: 2 mL; Refill: 0   Follow up:  Follow up in 3 months     Ivonne Andrew, NP 03/11/2023

## 2023-03-11 NOTE — Telephone Encounter (Signed)
Called pt and schedule an apt today. Gh

## 2023-03-11 NOTE — Patient Instructions (Addendum)
1. Overweight (BMI 25.0-29.9)  - Semaglutide-Weight Management (WEGOVY) 0.25 MG/0.5ML SOAJ; Inject 0.25 mg into the skin once a week.  Dispense: 2 mL; Refill: 0   Follow up:  Follow up in 3 months

## 2023-03-15 ENCOUNTER — Other Ambulatory Visit: Payer: Self-pay

## 2023-05-21 ENCOUNTER — Other Ambulatory Visit: Payer: Self-pay | Admitting: Family Medicine

## 2023-05-21 DIAGNOSIS — D571 Sickle-cell disease without crisis: Secondary | ICD-10-CM

## 2023-05-26 ENCOUNTER — Other Ambulatory Visit: Payer: Self-pay | Admitting: Family Medicine

## 2023-05-26 DIAGNOSIS — E559 Vitamin D deficiency, unspecified: Secondary | ICD-10-CM

## 2023-06-17 ENCOUNTER — Encounter: Payer: Self-pay | Admitting: Nurse Practitioner

## 2023-06-17 ENCOUNTER — Ambulatory Visit (INDEPENDENT_AMBULATORY_CARE_PROVIDER_SITE_OTHER): Payer: BC Managed Care – PPO | Admitting: Nurse Practitioner

## 2023-06-17 VITALS — BP 117/64 | HR 75 | Temp 97.2°F | Wt 171.0 lb

## 2023-06-17 DIAGNOSIS — M25512 Pain in left shoulder: Secondary | ICD-10-CM | POA: Diagnosis not present

## 2023-06-17 MED ORDER — KETOROLAC TROMETHAMINE 30 MG/ML IJ SOLN
30.0000 mg | Freq: Once | INTRAMUSCULAR | Status: AC
Start: 2023-06-17 — End: 2023-06-22

## 2023-06-17 MED ORDER — KETOROLAC TROMETHAMINE 30 MG/ML IJ SOLN
30.0000 mg | Freq: Once | INTRAMUSCULAR | Status: AC
Start: 2023-06-17 — End: 2023-06-17
  Administered 2023-06-17: 30 mg via INTRAMUSCULAR

## 2023-06-17 NOTE — Assessment & Plan Note (Signed)
-   ketorolac (TORADOL) 30 MG/ML injection 30 mg  Follow up:  Follow up as needed

## 2023-06-17 NOTE — Progress Notes (Addendum)
@Patient  ID: Krista [name], female    DOB: 17-Jun-1986, 37 y.o.   MRN: [id number]  Chief Complaint  Patient presents with   Shoulder Pain    Referring provider: Massie Maroon, FNP   HPI  Patient presents today for an acute visit.  She states that she has been trying to do some exercises at home.  She was doing exercises for her arms and started having left shoulder pain for the past 3 days.  She does have decreased range of motion.  Will give her a Toradol injection in office today she was advised to rest her arm over the weekend.  She can alternate heat and ice packs.  If the pain persist she will need imaging and possible referral to Ortho. Denies f/c/s, n/v/d, hemoptysis, PND, leg swelling Denies chest pain or edema     Allergies  Allergen Reactions   Keflex [Cephalexin] Swelling    Face swelling    Immunization History  Administered Date(s) Administered   Influenza,inj,Quad PF,6+ Mos 08/12/2015, 10/15/2016, 06/08/2017, 08/21/2019, 08/26/2020, 07/14/2021   PFIZER Comirnaty(Gray Top)Covid-19 Tri-Sucrose Vaccine 07/05/2020, 07/26/2020   PPD Test 05/22/2015   Pneumococcal Polysaccharide-23 08/12/2015, 05/06/2018, 04/14/2021   Tdap 03/06/2020    Past Medical History:  Diagnosis Date   Chronic pain    with sickle cell    Eczema    Heart murmur    07-13-2019  per pt since childhood,  has not ever had a echo done ,  asymptomatic   History of blood transfusion    x3    Missed ab    Sickle cell anemia (HCC)    followed by pcp---  last crisis 12-14-2018   Vitamin D deficiency    Wears glasses     Tobacco History: Social History   Tobacco Use  Smoking Status Never  Smokeless Tobacco Never   Counseling given: Not Answered   Outpatient Encounter Medications as of 06/17/2023  Medication Sig   aspirin EC (ASPIRIN LOW DOSE) 81 MG tablet Take 1 tablet (81 mg total) by mouth daily. Swallow whole.   azaTHIOprine (IMURAN) 50 MG tablet Take 50 mg by mouth daily.    clobetasol ointment (TEMOVATE) 0.05 % Apply 1 application topically 2 (two) times daily.   folic acid (FOLVITE) 1 MG tablet Take 1 tablet by mouth once daily   hydroxyurea (HYDREA) 500 MG capsule Take 1 capsule (500 mg total) by mouth daily. May take with food to minimize GI side effects.   ibuprofen (ADVIL) 800 MG tablet Take 1 tablet (800 mg total) by mouth every 8 (eight) hours as needed.   levonorgestrel (MIRENA, 52 MG,) 20 MCG/DAY IUD Mirena 20 mcg/24 hours (7 yrs) 52 mg intrauterine device  Take 1 device by intrauterine route.   oxycodone (OXY-IR) 5 MG capsule Take 1 capsule (5 mg total) by mouth every 4 (four) hours as needed.   triamcinolone ointment (KENALOG) 0.5 % Apply 1 application topically 2 (two) times daily.   Vitamin D, Ergocalciferol, (DRISDOL) 1.25 MG (50000 UNIT) CAPS capsule Take 1 capsule by mouth once a week   benzonatate (TESSALON) 200 MG capsule Take 1 capsule (200 mg total) by mouth 2 (two) times daily as needed for cough. (Patient not taking: Reported on 01/10/2023)   camphor-menthol (SARNA) lotion Apply 1 application topically as needed for itching. (Patient not taking: Reported on 05/05/2022)   Facility-Administered Encounter Medications as of 06/17/2023  Medication   ketorolac (TORADOL) 30 MG/ML injection 30 mg     Review of Systems  Review of Systems  Constitutional: Negative.   HENT: Negative.    Cardiovascular: Negative.   Gastrointestinal: Negative.   Musculoskeletal:  Positive for arthralgias and myalgias.  Allergic/Immunologic: Negative.   Neurological: Negative.   Psychiatric/Behavioral: Negative.         Physical Exam  BP 117/64   Pulse 75   Temp (!) 97.2 F (36.2 C)   Wt 171 lb (77.6 kg)   SpO2 99%   BMI 26.78 kg/m   Wt Readings from Last 5 Encounters:  06/17/23 171 lb (77.6 kg)  03/11/23 171 lb 6.4 oz (77.7 kg)  01/10/23 169 lb 9.6 oz (76.9 kg)  11/11/22 167 lb (75.8 kg)  05/05/22 168 lb 9.6 oz (76.5 kg)     Physical  Exam Vitals and nursing note reviewed.  Constitutional:      General: She is not in acute distress.    Appearance: She is well-developed.  Cardiovascular:     Rate and Rhythm: Normal rate and regular rhythm.  Pulmonary:     Effort: Pulmonary effort is normal.     Breath sounds: Normal breath sounds.  Musculoskeletal:     Left shoulder: Tenderness present. No swelling. Decreased range of motion.       Arms:  Neurological:     Mental Status: She is alert and oriented to person, place, and time.      Lab Results:  CBC    Component Value Date/Time   WBC 14.5 (H) 01/10/2023 1044   WBC 14.8 (H) 12/27/2022 0915   RBC 2.93 (L) 01/10/2023 1044   RBC 2.68 (L) 12/27/2022 0915   HGB 9.2 (L) 01/10/2023 1044   HCT 27.4 (L) 01/10/2023 1044   PLT 717 (H) 01/10/2023 1044   MCV 94 01/10/2023 1044   MCH 31.4 01/10/2023 1044   MCH 31.0 12/27/2022 0915   MCHC 33.6 01/10/2023 1044   MCHC 32.7 12/27/2022 0915   RDW 15.9 (H) 01/10/2023 1044   LYMPHSABS 2.8 01/10/2023 1044   MONOABS 1.2 (H) 12/27/2022 0915   EOSABS 0.1 01/10/2023 1044   BASOSABS 0.2 01/10/2023 1044    BMET    Component Value Date/Time   NA 141 01/10/2023 1044   K 4.9 01/10/2023 1044   CL 106 01/10/2023 1044   CO2 20 01/10/2023 1044   GLUCOSE 97 01/10/2023 1044   GLUCOSE 93 12/27/2022 0915   BUN 6 01/10/2023 1044   CREATININE 0.53 (L) 01/10/2023 1044   CREATININE 0.51 03/24/2017 1501   CALCIUM 10.0 01/10/2023 1044   GFRNONAA >60 12/27/2022 0915   GFRNONAA >89 03/24/2017 1501   GFRAA 140 11/11/2020 1027   GFRAA >89 03/24/2017 1501      Assessment & Plan:   Acute pain of left shoulder - ketorolac (TORADOL) 30 MG/ML injection 30 mg  Follow up:  Follow up as needed  Patient Instructions  1. Acute pain of left shoulder  - ketorolac (TORADOL) 30 MG/ML injection 30 mg  Follow up:  Follow up as needed   Rotator Cuff Tendinitis  Rotator cuff tendinitis is inflammation of the tendons in the rotator  cuff. Tendons are tough, cord-like bands that connect muscle to bone. The rotator cuff includes all of the muscles and tendons that connect the arm to the shoulder. The rotator cuff holds the head of the humerus, or the upper arm bone, in the cup of the shoulder blade (scapula). This condition can lead to a long-term (chronic) tear. The tear may be partial or complete. What are the causes? This  condition is usually caused by overusing the rotator cuff. What increases the risk? This condition is more likely to develop in athletes and workers who frequently use their shoulder or reach over their heads. This can include activities such as: Tennis. Baseball or softball. Swimming. Construction work. Painting. What are the signs or symptoms? Symptoms of this condition include: Pain that spreads (radiates) from the shoulder to the upper arm. Swelling and tenderness in front of the shoulder. Pain when reaching, pulling, or lifting the arm above the head. Pain when lowering the arm from above the head. Minor pain in the shoulder when resting. Increased pain in the shoulder at night. Difficulty placing the arm behind the back. How is this diagnosed? This condition is diagnosed with a physical exam and medical history. Tests may also be done, including: X-rays. CT. MRI. Ultrasound. How is this treated? Treatment depends on the severity of the condition. In less severe cases, treatment may include: Rest. This may be done with a sling that holds the shoulder still (immobilization). Your health care provider may also recommend avoiding activities that involve lifting your arm over your head. Icing the shoulder. Anti-inflammatory medicines, such as aspirin or ibuprofen. In more severe cases, treatment may include: Physical therapy. Steroid injections. Surgery. Follow these instructions at home: If you have a removable sling: Wear the sling as told by your provider. Remove it only as told by  your provider. Check the skin around the sling every day. Tell your provider about any concerns. Loosen the sling if your fingers tingle, become numb, or turn cold or blue. Keep the sling clean and dry. If the sling is not waterproof: Do not let it get wet. Remove it as told by your provider when you take a bath or shower. Managing pain, stiffness, and swelling  If told, put ice on the injured area. If you have a removable sling, remove it as told by your provider. Put ice in a plastic bag. Place a towel between your skin and the bag. Leave the ice on for 20 minutes, 2-3 times a day. If your skin turns bright red, remove the ice right away to prevent skin damage. The risk of damage is higher if you cannot feel pain, heat, or cold. Move your fingers often to reduce stiffness and swelling. Raise (elevate) the injured area above the level of your heart while you are sitting or lying down. Find a comfortable sleeping position, or sleep in a recliner, if available. Activity Rest your shoulder as told by your provider. Ask your provider when it is safe to drive if you have a sling on your arm. Return to your normal activities as told by your provider. Ask your provider what activities are safe for you. Do any exercises or stretches as told by your provider or physical therapist. If you do repetitive overhead tasks, take small breaks in between and include stretching exercises as told by your provider. General instructions Do not use any products that contain nicotine or tobacco. These products include cigarettes, chewing tobacco, and vaping devices, such as e-cigarettes. These can delay healing. If you need help quitting, ask your provider. Take over-the-counter and prescription medicines only as told by your provider. Contact a health care provider if: Your pain gets worse. You have new pain in your arm, hands, or fingers. Your pain is not relieved with medicine or does not get better after  6 weeks of treatment. You have crackling sensations when moving your shoulder in certain directions. You  hear a snapping sound after using your shoulder, followed by severe pain and weakness. Your arm, hand, or fingers are numb or tingling. Get help right away if: Your arm, hand, or fingers are swollen, painful, or they turn white or blue. This information is not intended to replace advice given to you by your health care provider. Make sure you discuss any questions you have with your health care provider. Document Revised: 06/02/2022 Document Reviewed: 05/19/2022 Elsevier Patient Education  [address]     Ivonne Andrew, [place] 06/17/2023

## 2023-06-17 NOTE — Addendum Note (Signed)
Addended by: Renelda Loma on: 06/17/2023 04:32 PM   Modules accepted: Orders

## 2023-06-17 NOTE — Patient Instructions (Addendum)
1. Acute pain of left shoulder  - ketorolac (TORADOL) 30 MG/ML injection 30 mg  Follow up:  Follow up as needed   Rotator Cuff Tendinitis  Rotator cuff tendinitis is inflammation of the tendons in the rotator cuff. Tendons are tough, cord-like bands that connect muscle to bone. The rotator cuff includes all of the muscles and tendons that connect the arm to the shoulder. The rotator cuff holds the head of the humerus, or the upper arm bone, in the cup of the shoulder blade (scapula). This condition can lead to a long-term (chronic) tear. The tear may be partial or complete. What are the causes? This condition is usually caused by overusing the rotator cuff. What increases the risk? This condition is more likely to develop in athletes and workers who frequently use their shoulder or reach over their heads. This can include activities such as: Tennis. Baseball or softball. Swimming. Construction work. Painting. What are the signs or symptoms? Symptoms of this condition include: Pain that spreads (radiates) from the shoulder to the upper arm. Swelling and tenderness in front of the shoulder. Pain when reaching, pulling, or lifting the arm above the head. Pain when lowering the arm from above the head. Minor pain in the shoulder when resting. Increased pain in the shoulder at night. Difficulty placing the arm behind the back. How is this diagnosed? This condition is diagnosed with a physical exam and medical history. Tests may also be done, including: X-rays. CT. MRI. Ultrasound. How is this treated? Treatment depends on the severity of the condition. In less severe cases, treatment may include: Rest. This may be done with a sling that holds the shoulder still (immobilization). Your health care provider may also recommend avoiding activities that involve lifting your arm over your head. Icing the shoulder. Anti-inflammatory medicines, such as aspirin or ibuprofen. In more severe  cases, treatment may include: Physical therapy. Steroid injections. Surgery. Follow these instructions at home: If you have a removable sling: Wear the sling as told by your provider. Remove it only as told by your provider. Check the skin around the sling every day. Tell your provider about any concerns. Loosen the sling if your fingers tingle, become numb, or turn cold or blue. Keep the sling clean and dry. If the sling is not waterproof: Do not let it get wet. Remove it as told by your provider when you take a bath or shower. Managing pain, stiffness, and swelling  If told, put ice on the injured area. If you have a removable sling, remove it as told by your provider. Put ice in a plastic bag. Place a towel between your skin and the bag. Leave the ice on for 20 minutes, 2-3 times a day. If your skin turns bright red, remove the ice right away to prevent skin damage. The risk of damage is higher if you cannot feel pain, heat, or cold. Move your fingers often to reduce stiffness and swelling. Raise (elevate) the injured area above the level of your heart while you are sitting or lying down. Find a comfortable sleeping position, or sleep in a recliner, if available. Activity Rest your shoulder as told by your provider. Ask your provider when it is safe to drive if you have a sling on your arm. Return to your normal activities as told by your provider. Ask your provider what activities are safe for you. Do any exercises or stretches as told by your provider or physical therapist. If you do repetitive overhead tasks,  take small breaks in between and include stretching exercises as told by your provider. General instructions Do not use any products that contain nicotine or tobacco. These products include cigarettes, chewing tobacco, and vaping devices, such as e-cigarettes. These can delay healing. If you need help quitting, ask your provider. Take over-the-counter and prescription  medicines only as told by your provider. Contact a health care provider if: Your pain gets worse. You have new pain in your arm, hands, or fingers. Your pain is not relieved with medicine or does not get better after 6 weeks of treatment. You have crackling sensations when moving your shoulder in certain directions. You hear a snapping sound after using your shoulder, followed by severe pain and weakness. Your arm, hand, or fingers are numb or tingling. Get help right away if: Your arm, hand, or fingers are swollen, painful, or they turn white or blue. This information is not intended to replace advice given to you by your health care provider. Make sure you discuss any questions you have with your health care provider. Document Revised: 06/02/2022 Document Reviewed: 05/19/2022 Elsevier Patient Education  2024 [employer].

## 2023-06-21 NOTE — Telephone Encounter (Signed)
Pt called and said that she was seen the other day about her shoulder and still in PAIN  Can she get that referral to Ortho.?

## 2023-06-24 ENCOUNTER — Other Ambulatory Visit: Payer: Self-pay | Admitting: Nurse Practitioner

## 2023-06-24 DIAGNOSIS — M25612 Stiffness of left shoulder, not elsewhere classified: Secondary | ICD-10-CM

## 2023-06-24 DIAGNOSIS — G8929 Other chronic pain: Secondary | ICD-10-CM

## 2023-06-29 ENCOUNTER — Encounter (HOSPITAL_BASED_OUTPATIENT_CLINIC_OR_DEPARTMENT_OTHER): Payer: Self-pay | Admitting: Student

## 2023-06-29 ENCOUNTER — Ambulatory Visit (INDEPENDENT_AMBULATORY_CARE_PROVIDER_SITE_OTHER): Payer: BC Managed Care – PPO

## 2023-06-29 ENCOUNTER — Ambulatory Visit (INDEPENDENT_AMBULATORY_CARE_PROVIDER_SITE_OTHER): Payer: BC Managed Care – PPO | Admitting: Student

## 2023-06-29 DIAGNOSIS — M25512 Pain in left shoulder: Secondary | ICD-10-CM | POA: Diagnosis not present

## 2023-06-29 NOTE — Progress Notes (Signed)
Chief Complaint: Left shoulder pain     History of Present Illness:    Krista [name] is a 37 y.o. female presenting today with left shoulder pain.  Patient states that this began 3 weeks ago the day after performing wall Pilates.  She did see her PCP regarding this a few weeks ago and received a Toradol injection which did not help.  Most of her pain has been occurring with range of motion but she also describes discomfort at night while sleeping.  Pain has been improving over the last few days and is now mild to moderate in severity.  Has not been taking any pain medications or using any other treatments.  Denies any neck pain, numbness, or tingling.  She does have history of sickle cell anemia.  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   Surgical History:   None  PMH/PSH/Family History/Social History/Meds/Allergies:    Past Medical History:  Diagnosis Date   Chronic pain    with sickle cell    Eczema    Heart murmur    07-13-2019  per pt since childhood,  has not ever had a echo done ,  asymptomatic   History of blood transfusion    x3    Missed ab    Sickle cell anemia (HCC)    followed by pcp---  last crisis 12-14-2018   Vitamin D deficiency    Wears glasses    Past Surgical History:  Procedure Laterality Date   EYE SURGERY Left 11/12/2019   LAPAROSCOPIC CHOLECYSTECTOMY  1997   Social History   Socioeconomic History   Marital status: Married    Spouse name: n/a   Number of children: 0   Years of education: 16+   Highest education level: Master's degree (e.g., MA, MS, MEng, MEd, MSW, MBA)  Occupational History   Occupation: TEACHER    Employer: [employer]  Tobacco Use   Smoking status: Never   Smokeless tobacco: Never  Vaping Use   Vaping status: Never Used  Substance and Sexual Activity   Alcohol use: Not Currently    Comment: socially   Drug use: Never   Sexual activity: Yes    Birth  control/protection: None  Other Topics Concern   Not on file  Social History Narrative   ** Merged History Encounter **       Lives alone.  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 Currently teaches at [employer].   Social Determinants of Health   Financial Resource Strain: Low Risk  (01/07/2023)   Overall Financial Resource Strain (CARDIA)    Difficulty of Paying Living Expenses: Not hard at all  Food Insecurity: No Food Insecurity (01/07/2023)   Hunger Vital Sign    Worried About Running Out of Food in the Last Year: Never true    Ran Out of Food in the Last Year: Never true  Transportation Needs: No Transportation Needs (01/07/2023)   PRAPARE - Administrator, Civil Service (Medical): No    Lack of Transportation (Non-Medical): No  Physical Activity: Unknown (01/07/2023)   Exercise Vital Sign    Days of Exercise per Week: 0 days    Minutes of Exercise per Session: Not on file  Stress: No Stress Concern Present (01/07/2023)  [employer] of Occupational Health - Occupational Stress Questionnaire    Feeling of Stress : Not at all  Social Connections: Socially Integrated (01/07/2023)   Social Connection and Isolation Panel [NHANES]    Frequency of Communication with Friends and Family: More than three times a week    Frequency of Social Gatherings with Friends and Family: Once a week    Attends Religious Services: More than 4 times per year    Active Member of [employer] or Organizations: Yes    Attends Banker Meetings: 1 to 4 times per year    Marital Status: Married   Family History  Problem Relation Age of Onset   Diabetes Father    Hypertension Father    Heart disease Brother    Cancer Maternal Grandmother    Heart disease Brother    Breast cancer Paternal Aunt    Cancer Paternal Uncle    Allergies  Allergen Reactions   Keflex [Cephalexin] Swelling    Face swelling   Current Outpatient Medications   Medication Sig Dispense Refill   aspirin EC (ASPIRIN LOW DOSE) 81 MG tablet Take 1 tablet (81 mg total) by mouth daily. Swallow whole. 90 tablet 1   azaTHIOprine (IMURAN) 50 MG tablet Take 50 mg by mouth daily.     benzonatate (TESSALON) 200 MG capsule Take 1 capsule (200 mg total) by mouth 2 (two) times daily as needed for cough. (Patient not taking: Reported on 01/10/2023) 20 capsule 0   camphor-menthol (SARNA) lotion Apply 1 application topically as needed for itching. (Patient not taking: Reported on 05/05/2022) 222 mL 0   clobetasol ointment (TEMOVATE) 0.05 % Apply 1 application topically 2 (two) times daily. 60 g 2   folic acid (FOLVITE) 1 MG tablet Take 1 tablet by mouth once daily 90 tablet 0   hydroxyurea (HYDREA) 500 MG capsule Take 1 capsule (500 mg total) by mouth daily. May take with food to minimize GI side effects. 90 capsule 1   ibuprofen (ADVIL) 800 MG tablet Take 1 tablet (800 mg total) by mouth every 8 (eight) hours as needed. 60 tablet 1   levonorgestrel (MIRENA, 52 MG,) 20 MCG/DAY IUD Mirena 20 mcg/24 hours (7 yrs) 52 mg intrauterine device  Take 1 device by intrauterine route.     oxycodone (OXY-IR) 5 MG capsule Take 1 capsule (5 mg total) by mouth every 4 (four) hours as needed. 30 capsule 0   triamcinolone ointment (KENALOG) 0.5 % Apply 1 application topically 2 (two) times daily. 30 g 3   Vitamin D, Ergocalciferol, (DRISDOL) 1.25 MG (50000 UNIT) CAPS capsule Take 1 capsule by mouth once a week 12 capsule 0   No current facility-administered medications for this visit.   No results found.  Review of Systems:   A ROS was performed including pertinent positives and negatives as documented in the HPI.  Physical Exam :   Constitutional: NAD and appears stated age Neurological: Alert and oriented Psych: Appropriate affect and cooperative currently breastfeeding.   Comprehensive Musculoskeletal Exam:    Mild tenderness palpation over the anterior left glenohumeral  joint.  Active range of motion to 160 degrees forward flexion, 50 degrees external rotation, and internal rotation to L1 bilaterally.  Negative Neer, Hawkins, empty can, and lift off tests.  Neurosensory exam intact.  Imaging:   Xray (left shoulder 3 view): Negative   I personally reviewed and interpreted the radiographs.   Assessment:   37 y.o. female with acute left shoulder pain.  I  believe her symptoms are most consistent with rotator cuff tendinitis.  She does have good rotator cuff strength to lower suspicion for a tear and does have full and equal ROM bilaterally.  Given that her symptoms have been improving, I would recommend proceeding with a home exercise program for rotator cuff strengthening as well as NSAIDs as needed.  Did discuss that should she continue to have pain, could consider subacromial injection or formal referral to physical therapy.  Did provide her resources for home exercises.  Will plan to follow-up as needed  Plan :    -Return to clinic as needed     I personally saw and evaluated the patient, and participated in the management and treatment plan.  Hazle Nordmann, PA-C Orthopedics

## 2023-07-12 ENCOUNTER — Ambulatory Visit: Payer: BC Managed Care – PPO | Admitting: Family Medicine

## 2023-07-12 ENCOUNTER — Encounter: Payer: Self-pay | Admitting: Family Medicine

## 2023-07-12 VITALS — BP 117/74 | HR 70 | Temp 98.2°F | Resp 12 | Ht 67.0 in | Wt 170.0 lb

## 2023-07-12 DIAGNOSIS — E559 Vitamin D deficiency, unspecified: Secondary | ICD-10-CM

## 2023-07-12 DIAGNOSIS — D571 Sickle-cell disease without crisis: Secondary | ICD-10-CM

## 2023-07-12 DIAGNOSIS — Z23 Encounter for immunization: Secondary | ICD-10-CM

## 2023-07-12 NOTE — Patient Instructions (Signed)
Living With Sickle Cell Disease Living with a long-term condition, such as sickle cell disease, can be a challenge. It can affect both your physical and mental health. You may not have total control over your condition. But proper care and treatment can help manage the effects of the disease so you can feel good and lead an active life. You can take steps to manage your condition and stay as healthy as possible. How does sickle cell disease affect me? Sickle cell disease can cause challenges that affect your quality of life. You may get sick more often as a result of organ damage and infections. Sometimes you may need to stay in the hospital. Learn how to recognize that you are not feeling well and that you may be getting sick. What actions can I take to manage my condition?  The goals of treatment are to control your symptoms and prevent and treat problems. Work with your health care provider to create a treatment plan that works for you. Taking an active role in managing your condition can help you feel more in control of your situation. Ask about possible side effects of medicines that your health care provider recommends. Discuss how you feel about having those side effects. Keeping a healthy lifestyle can help you manage your condition. This includes eating a healthy diet, getting enough sleep, and getting regular exercise. Sickle cell disease may affect your ability to take care of your basic needs. Tell your health care provider if you have concerns about any of these needs: Access to food. Housing. Safe drinking water and other utilities. Safety in your home and community. Work or school. Transportation. Paying for health care. Your health care provider may be able to connect you with community resources that can help you. How to manage stress  Living with sickle cell disease can be stressful. This disease can have a big impact on your mental health. Talk with your health care provider  about ways to reduce your stress or if you have concerns about your mental health.  To cope with stress, try: Keeping a stress diary. This can help you learn what causes your stress to start (figure out your triggers) and how to control your response to those triggers. Spending time doing things that you enjoy, such as: Hobbies. Being outdoors. Spending time with friends and people who make you laugh. Doing yoga, muscle relaxation, deep breathing, or mindfulness practices. Expressing yourself through journal writing, art, crafting, poetry, or playing music. Staying positive about your health. Try to accept that you cannot control your condition perfectly. Follow these instructions at home: Medicines Take over-the-counter and prescription medicines only as told by your health care provider. If you were prescribed antibiotics, take them as told by your health care provider. Do not stop taking them even if you start to feel better. If you develop a fever, do not take medicines to reduce the fever right away. This could cover up another problem. Contact your health care provider. Eating and drinking Drink enough fluid to keep your urine pale yellow. Drink more in hot weather and during exercise. Limit or avoid drinking alcohol. Eat a balanced and nutritious diet. Eat plenty of fruits, vegetables, whole grains, and lean protein. Take vitamins and supplements as told by your health care provider. Traveling When traveling, keep these with you: Your medical information. The names of your health care providers. Your medicines. If you have to travel by air, ask about precautions you should take. Managing pain Work with  your health care provider to create a pain management plan that works for you. The plan may include: Ways to reduce or manage your pain at home, such as: Using a heating pad. Taking a warm bath. Using healthy ways to distract you from the pain, such as hobbies or  reading. Practicing ways to relax, such as doing yoga or listening to music. Getting massages. Doing exercises or stretches as told by a physical therapist. Tracking how pain affects your daily life functions. When to seek help. Who to contact and what to do in case of a pain emergency. General instructions Do not use any products that contain nicotine or tobacco. These products include cigarettes, chewing tobacco, and vaping devices, such as e-cigarettes. These lower blood oxygen levels. If you need help quitting, ask your health care provider. Consider wearing a medical alert bracelet. Use an app or journal to track your symptoms, assess your level of pain and fatigue, and keep track of your medicines. Avoid the following: High altitudes. Very high or low temperatures and big changes in temperature. Activities that will lower your oxygen levels, such as mountain climbing or doing exercise that takes a lot of effort. Stay up to date on: Your treatment plan. Learn as much as you can about your condition. Health screenings. This will help prevent problems or catch them early on. Vaccines. This will help prevent infection. Wash your hands often with soap and water to help prevent infections. Wash them for at least 20 seconds each time. Keep all follow-up visits. Regular follow-up with your health care provider can help you better manage your condition. Where to find support You can find help and support through: Talking with a therapist or taking part in support groups. Sickle Cell Disease Foundation of [place]: www.sicklecelldisease.org Where to find more information Centers for Disease Control and Prevention: FootballExhibition.com.br American Society of Hematology: www.hematology.org Contact a health care provider if: Your symptoms get worse. You have new symptoms. You have a fever. Get help right away if: You have a painful erection of the penis that lasts a long time (priapism). You become  short of breath or are having trouble breathing. You have pain that cannot be controlled with medicine. You have any signs of a stroke. "BE FAST" is an easy way to remember the main warning signs: B - Balance. Dizziness, sudden trouble walking, or loss of balance. E - Eyes. Trouble seeing or a change in how you see. F - Face. Sudden weakness or loss of feeling of the face. The face or eyelid may droop on one side. A - Arms. Weakness or loss of feeling in an arm. This happens all of a sudden and most often on one side of the body. S - Speech. Sudden trouble speaking, slurred speech, or trouble understanding what people say. T - Time. Time to call emergency services. Write down what time symptoms started. You have other signs of a stroke, such as: A sudden, very bad headache with no known cause. Feeling like you may vomit (nausea). Vomiting. Seizure. These symptoms may be an emergency. Get help right away. Call 911. Do not wait to see if the symptoms will go away. Do not drive yourself to the hospital. Also, get help right away if: You have strong feelings of sadness or loss of hope, or you have thoughts about hurting yourself or others. Take one of these steps if you feel like you may hurt yourself or others, or have thoughts about taking your own life:  Go to your nearest emergency room. Call 911. Call the National Suicide Prevention Lifeline at 902-107-4594 or 988. This is open 24 hours a day. Text the Crisis Text Line at (938)384-7604. Summary Proper care and treatment can help manage the effects of sickle cell disease so you can feel good and lead an active life. The goals of treatment are to control your symptoms and prevent and treat problems. Taking an active role in managing your condition can help you feel more in control of your situation. Work with your health care provider to create a pain management plan that works for you. Get medical help right away as told by your health care  provider. This information is not intended to replace advice given to you by your health care provider. Make sure you discuss any questions you have with your health care provider. Document Revised: 01/11/2022 Document Reviewed: 01/11/2022 Elsevier Patient Education  2024 [employer].

## 2023-07-12 NOTE — Progress Notes (Signed)
Pt is here for 6 month f/u   No new complaints or concerns at this time per pt

## 2023-07-13 LAB — CMP14+CBC/D/PLT+FER+RETIC+V...
ALT: 28 IU/L (ref 0–32)
AST: 32 IU/L (ref 0–40)
Albumin: 4.9 g/dL (ref 3.9–4.9)
Alkaline Phosphatase: 99 IU/L (ref 44–121)
BUN/Creatinine Ratio: 9 (ref 9–23)
BUN: 5 mg/dL — ABNORMAL LOW (ref 6–20)
Basophils Absolute: 0.1 10*3/uL (ref 0.0–0.2)
Basos: 1 %
Bilirubin Total: 1.9 mg/dL — ABNORMAL HIGH (ref 0.0–1.2)
CO2: 19 mmol/L — ABNORMAL LOW (ref 20–29)
Calcium: 9.4 mg/dL (ref 8.7–10.2)
Chloride: 102 mmol/L (ref 96–106)
Creatinine, Ser: 0.53 mg/dL — ABNORMAL LOW (ref 0.57–1.00)
EOS (ABSOLUTE): 0 10*3/uL (ref 0.0–0.4)
Eos: 0 %
Ferritin: 548 ng/mL — ABNORMAL HIGH (ref 15–150)
Globulin, Total: 2.6 g/dL (ref 1.5–4.5)
Glucose: 89 mg/dL (ref 70–99)
Hematocrit: 30.4 % — ABNORMAL LOW (ref 34.0–46.6)
Hemoglobin: 10 g/dL — ABNORMAL LOW (ref 11.1–15.9)
Immature Grans (Abs): 0.1 10*3/uL (ref 0.0–0.1)
Immature Granulocytes: 1 %
Lymphocytes Absolute: 3.1 10*3/uL (ref 0.7–3.1)
Lymphs: 26 %
MCH: 31.9 pg (ref 26.6–33.0)
MCHC: 32.9 g/dL (ref 31.5–35.7)
MCV: 97 fL (ref 79–97)
Monocytes Absolute: 0.7 10*3/uL (ref 0.1–0.9)
Monocytes: 5 %
NRBC: 4 % — ABNORMAL HIGH (ref 0–0)
Neutrophils Absolute: 8.2 10*3/uL — ABNORMAL HIGH (ref 1.4–7.0)
Neutrophils: 67 %
Platelets: 673 10*3/uL — ABNORMAL HIGH (ref 150–450)
Potassium: 4.4 mmol/L (ref 3.5–5.2)
RBC: 3.13 x10E6/uL — ABNORMAL LOW (ref 3.77–5.28)
RDW: 14.4 % (ref 11.7–15.4)
Retic Ct Pct: 11.3 % — ABNORMAL HIGH (ref 0.6–2.6)
Sodium: 138 mmol/L (ref 134–144)
Total Protein: 7.5 g/dL (ref 6.0–8.5)
Vit D, 25-Hydroxy: 49.4 ng/mL (ref 30.0–100.0)
WBC: 12.2 10*3/uL — ABNORMAL HIGH (ref 3.4–10.8)
eGFR: 123 mL/min/{1.73_m2} (ref >59)

## 2023-07-13 LAB — URINALYSIS, ROUTINE W REFLEX MICROSCOPIC
Bilirubin, UA: NEGATIVE
Glucose, UA: NEGATIVE
Ketones, UA: NEGATIVE
Leukocytes,UA: NEGATIVE
Nitrite, UA: NEGATIVE
Protein,UA: NEGATIVE
RBC, UA: NEGATIVE
Specific Gravity, UA: 1.009 (ref 1.005–1.030)
Urobilinogen, Ur: 1 mg/dL (ref 0.2–1.0)
pH, UA: 6.5 (ref 5.0–7.5)

## 2023-07-16 ENCOUNTER — Other Ambulatory Visit: Payer: Self-pay | Admitting: Nurse Practitioner

## 2023-08-21 ENCOUNTER — Other Ambulatory Visit: Payer: Self-pay | Admitting: Nurse Practitioner

## 2023-08-21 DIAGNOSIS — E559 Vitamin D deficiency, unspecified: Secondary | ICD-10-CM

## 2023-08-22 NOTE — Telephone Encounter (Signed)
Please advise if you pt to continue. Kh

## 2023-09-07 ENCOUNTER — Other Ambulatory Visit: Payer: Self-pay | Admitting: Family Medicine

## 2023-09-07 DIAGNOSIS — D571 Sickle-cell disease without crisis: Secondary | ICD-10-CM

## 2023-09-18 ENCOUNTER — Other Ambulatory Visit: Payer: Self-pay | Admitting: Family Medicine

## 2023-09-18 DIAGNOSIS — D571 Sickle-cell disease without crisis: Secondary | ICD-10-CM

## 2023-10-28 ENCOUNTER — Other Ambulatory Visit: Payer: Self-pay | Admitting: Family Medicine

## 2023-11-04 ENCOUNTER — Ambulatory Visit: Payer: Self-pay | Admitting: Family Medicine

## 2023-11-04 NOTE — Telephone Encounter (Signed)
Called pt back: pt stated at urgent care now waiting to be seen due to agent had informed earlier no face to face appointments available today: pt stated thanked me for calling back.

## 2023-11-04 NOTE — Telephone Encounter (Signed)
Copied from CRM [id number]. Topic: Clinical - Medical Advice >> Nov 04, 2023  8:54 AM Ivette P wrote: Reason for CRM: PT has flu symptoms or sinus infection. Ears hurting, cough, stuffed up, Pt stated symptoms started 01/11. Requesting Medical advice or some type of prescription antibiotic to get over the symptoms. Call back [phone]

## 2023-11-08 ENCOUNTER — Other Ambulatory Visit: Payer: Self-pay | Admitting: Family Medicine

## 2023-11-08 DIAGNOSIS — E559 Vitamin D deficiency, unspecified: Secondary | ICD-10-CM

## 2023-11-09 ENCOUNTER — Ambulatory Visit (INDEPENDENT_AMBULATORY_CARE_PROVIDER_SITE_OTHER): Payer: 59 | Admitting: Nurse Practitioner

## 2023-11-09 ENCOUNTER — Encounter (HOSPITAL_BASED_OUTPATIENT_CLINIC_OR_DEPARTMENT_OTHER): Payer: Self-pay | Admitting: Emergency Medicine

## 2023-11-09 ENCOUNTER — Other Ambulatory Visit: Payer: Self-pay

## 2023-11-09 ENCOUNTER — Emergency Department (HOSPITAL_BASED_OUTPATIENT_CLINIC_OR_DEPARTMENT_OTHER)
Admission: EM | Admit: 2023-11-09 | Discharge: 2023-11-09 | Disposition: A | Discharge status: Home / Self Care | Payer: BC Managed Care – PPO | Attending: Emergency Medicine | Admitting: Emergency Medicine

## 2023-11-09 ENCOUNTER — Emergency Department (HOSPITAL_BASED_OUTPATIENT_CLINIC_OR_DEPARTMENT_OTHER): Payer: BC Managed Care – PPO

## 2023-11-09 ENCOUNTER — Ambulatory Visit: Payer: Self-pay | Admitting: Family Medicine

## 2023-11-09 ENCOUNTER — Encounter: Payer: Self-pay | Admitting: Nurse Practitioner

## 2023-11-09 VITALS — BP 120/61 | HR 111 | Temp 97.9°F | Wt 170.0 lb

## 2023-11-09 DIAGNOSIS — Z20822 Contact with and (suspected) exposure to covid-19: Secondary | ICD-10-CM | POA: Insufficient documentation

## 2023-11-09 DIAGNOSIS — D57219 Sickle-cell/Hb-C disease with crisis, unspecified: Secondary | ICD-10-CM | POA: Insufficient documentation

## 2023-11-09 DIAGNOSIS — Z7982 Long term (current) use of aspirin: Secondary | ICD-10-CM | POA: Insufficient documentation

## 2023-11-09 DIAGNOSIS — R112 Nausea with vomiting, unspecified: Secondary | ICD-10-CM | POA: Diagnosis not present

## 2023-11-09 DIAGNOSIS — D5700 Hb-SS disease with crisis, unspecified: Secondary | ICD-10-CM

## 2023-11-09 LAB — BASIC METABOLIC PANEL
Anion gap: 10 (ref 5–15)
BUN: 8 mg/dL (ref 6–20)
CO2: 24 mmol/L (ref 22–32)
Calcium: 9.4 mg/dL (ref 8.9–10.3)
Chloride: 107 mmol/L (ref 98–111)
Creatinine, Ser: 0.55 mg/dL (ref 0.44–1.00)
GFR, Estimated: >60 mL/min (ref >60)
Glucose, Bld: 114 mg/dL — ABNORMAL HIGH (ref 70–99)
Potassium: 3.7 mmol/L (ref 3.5–5.1)
Sodium: 141 mmol/L (ref 135–145)

## 2023-11-09 LAB — CBC WITH DIFFERENTIAL/PLATELET
Abs Immature Granulocytes: 0.18 10*3/uL — ABNORMAL HIGH (ref 0.00–0.07)
Basophils Absolute: 0 10*3/uL (ref 0.0–0.1)
Basophils Relative: 0 %
Eosinophils Absolute: 0 10*3/uL (ref 0.0–0.5)
Eosinophils Relative: 0 %
HCT: 31.9 % — ABNORMAL LOW (ref 36.0–46.0)
Hemoglobin: 10.7 g/dL — ABNORMAL LOW (ref 12.0–15.0)
Immature Granulocytes: 1 %
Lymphocytes Relative: 2 %
Lymphs Abs: 0.4 10*3/uL — ABNORMAL LOW (ref 0.7–4.0)
MCH: 31.3 pg (ref 26.0–34.0)
MCHC: 33.5 g/dL (ref 30.0–36.0)
MCV: 93.3 fL (ref 80.0–100.0)
Monocytes Absolute: 0.6 10*3/uL (ref 0.1–1.0)
Monocytes Relative: 4 %
Neutro Abs: 15.1 10*3/uL — ABNORMAL HIGH (ref 1.7–7.7)
Neutrophils Relative %: 93 %
Platelets: 903 10*3/uL (ref 150–400)
RBC: 3.42 MIL/uL — ABNORMAL LOW (ref 3.87–5.11)
RDW: 15.6 % — ABNORMAL HIGH (ref 11.5–15.5)
WBC: 16.3 10*3/uL — ABNORMAL HIGH (ref 4.0–10.5)
nRBC: 2.6 % — ABNORMAL HIGH (ref 0.0–0.2)

## 2023-11-09 LAB — RESP PANEL BY RT-PCR (RSV, FLU A&B, COVID)  RVPGX2
Influenza A by PCR: NEGATIVE
Influenza B by PCR: NEGATIVE
Resp Syncytial Virus by PCR: NEGATIVE
SARS Coronavirus 2 by RT PCR: NEGATIVE

## 2023-11-09 LAB — RETICULOCYTES
Immature Retic Fract: 38.3 % — ABNORMAL HIGH (ref 2.3–15.9)
RBC.: 3.45 MIL/uL — ABNORMAL LOW (ref 3.87–5.11)
Retic Count, Absolute: 318.5 10*3/uL — ABNORMAL HIGH (ref 19.0–186.0)
Retic Ct Pct: 9.2 % — ABNORMAL HIGH (ref 0.4–3.1)

## 2023-11-09 LAB — HCG, SERUM, QUALITATIVE: Preg, Serum: NEGATIVE

## 2023-11-09 MED ORDER — ONDANSETRON HCL 4 MG PO TABS: 4.0000 mg | ORAL_TABLET | Freq: Three times a day (TID) | ORAL | 0 refills | Status: AC | PRN

## 2023-11-09 MED ORDER — SODIUM CHLORIDE 0.9 % IV BOLUS
1000.0000 mL | Freq: Once | INTRAVENOUS | Status: AC
  Administered 2023-11-09: 1000 mL via INTRAVENOUS

## 2023-11-09 MED ORDER — MORPHINE SULFATE (PF) 4 MG/ML IV SOLN
4.0000 mg | Freq: Once | INTRAVENOUS | Status: AC
Start: 2023-11-09 — End: 2023-11-09
  Administered 2023-11-09: 4 mg via INTRAVENOUS
  Filled 2023-11-09: qty 1

## 2023-11-09 MED ORDER — ONDANSETRON HCL 4 MG/2ML IJ SOLN
4.0000 mg | Freq: Once | INTRAMUSCULAR | Status: AC
  Administered 2023-11-09: 4 mg via INTRAVENOUS
  Filled 2023-11-09: qty 2

## 2023-11-09 MED ORDER — MORPHINE SULFATE (PF) 4 MG/ML IV SOLN
4.0000 mg | Freq: Once | INTRAVENOUS | Status: AC
  Administered 2023-11-09: 4 mg via INTRAVENOUS
  Filled 2023-11-09: qty 1

## 2023-11-09 NOTE — ED Provider Notes (Signed)
[place] EMERGENCY DEPARTMENT AT [facility] Provider Note   CSN: [id number] Arrival date & time: 11/09/23  1502     History  Chief Complaint  Patient presents with   Sickle Cell Pain Crisis    Krista [name] is a 38 y.o. female.  With a history of sickle cell disease who presents to the ED for sick cell pain crisis.  Patient recently tested positive for the flu and had influenza symptoms and then had sinus congestion and was treated with an antibiotic (Augmentin).  Today she began to develop nausea and vomiting and vomited 4 times that experience her typical sickle cell pain within her hips knees and ankles.  She does have a prescription for oxycodone 5 mg for this reason but was unable to keep medication down at home.  No fevers chills, chest pain, shortness of breath or other complaints at this time  Sickle Cell Pain Crisis      Home Medications Prior to Admission medications   Medication Sig Start Date End Date Taking? Authorizing Provider  ondansetron (ZOFRAN) 4 MG tablet Take 1 tablet (4 mg total) by mouth every 8 (eight) hours as needed for up to 5 days for nausea or vomiting. 11/09/23 11/14/23 Yes Estelle June A, DO  amoxicillin-clavulanate (AUGMENTIN) 875-125 MG tablet Take by mouth. 04/21/22 11/14/23  [provider]  azaTHIOprine (IMURAN) 50 MG tablet Take 50 mg by mouth daily.    [provider]  benzonatate (TESSALON) 200 MG capsule Take 1 capsule (200 mg total) by mouth 2 (two) times daily as needed for cough. Patient not taking: Reported on 11/09/2023 11/11/22   Ivonne Andrew, NP  brompheniramine-pseudoephedrine-DM 30-2-10 MG/5ML syrup Take by mouth. 11/04/23   [provider]  camphor-menthol Wynelle Fanny) lotion Apply 1 application topically as needed for itching. Patient not taking: Reported on 11/09/2023 04/14/21   Massie Maroon, FNP  clobetasol ointment (TEMOVATE) 0.05 % Apply 1 application topically 2 (two) times daily. Patient not  taking: Reported on 11/09/2023 11/03/21   Janalyn Harder, MD  EQ ASPIRIN ADULT LOW DOSE 81 MG tablet TAKE 1 TABLET BY MOUTH ONCE DAILY SWALLOW  WHOLE 10/31/23   Paseda, Baird Kay, FNP  fluticasone (FLONASE) 50 MCG/ACT nasal spray 1 SPRAY IN EACH NOSTRIL EVERY 12HRS AS NEEDED 11/04/23   [provider]  folic acid (FOLVITE) 1 MG tablet Take 1 tablet by mouth once daily 09/18/23   Ivonne Andrew, NP  hydroxyurea (HYDREA) 500 MG capsule Take 1 capsule (500 mg total) by mouth daily. May take with food to minimize GI side effects. 01/19/23   Massie Maroon, FNP  ibuprofen (ADVIL) 800 MG tablet Take 1 tablet (800 mg total) by mouth every 8 (eight) hours as needed. 07/24/21   Massie Maroon, FNP  levonorgestrel (MIRENA, 52 MG,) 20 MCG/DAY IUD Mirena 20 mcg/24 hours (7 yrs) 52 mg intrauterine device  Take 1 device by intrauterine route.    [provider]  oxycodone (OXY-IR) 5 MG capsule Take 1 capsule (5 mg total) by mouth every 4 (four) hours as needed. 07/24/21   Massie Maroon, FNP  triamcinolone ointment (KENALOG) 0.5 % Apply 1 application topically 2 (two) times daily. 03/02/21   Massie Maroon, FNP  Vitamin D, Ergocalciferol, (DRISDOL) 1.25 MG (50000 UNIT) CAPS capsule Take 1 capsule by mouth once a week 11/08/23   Donell Beers, FNP      Allergies    Keflex [cephalexin]    Review of Systems  Review of Systems  Physical Exam Updated Vital Signs BP 124/80   Pulse 100   Temp 98.8 F (37.1 C)   Resp 17   Wt 77.1 kg   LMP 11/02/2023   SpO2 96%   BMI 26.63 kg/m  Physical Exam Vitals and nursing note reviewed.  HENT:     Head: Normocephalic and atraumatic.  Eyes:     Pupils: Pupils are equal, round, and reactive to light.  Cardiovascular:     Rate and Rhythm: Normal rate and regular rhythm.  Pulmonary:     Effort: Pulmonary effort is normal.     Breath sounds: Normal breath sounds.  Abdominal:     Palpations: Abdomen is soft.     Tenderness: There is  no abdominal tenderness.  Skin:    General: Skin is warm and dry.  Neurological:     Mental Status: She is alert.  Psychiatric:        Mood and Affect: Mood normal.     ED Results / Procedures / Treatments   Labs (all labs ordered are listed, but only abnormal results are displayed) Labs Reviewed  BASIC METABOLIC PANEL - Abnormal; Notable for the following components:      Result Value   Glucose, Bld 114 (*)    All other components within normal limits  CBC WITH DIFFERENTIAL/PLATELET - Abnormal; Notable for the following components:   WBC 16.3 (*)    RBC 3.42 (*)    Hemoglobin 10.7 (*)    HCT 31.9 (*)    RDW 15.6 (*)    Platelets 903 (*)    nRBC 2.6 (*)    Neutro Abs 15.1 (*)    Lymphs Abs 0.4 (*)    Abs Immature Granulocytes 0.18 (*)    All other components within normal limits  RETICULOCYTES - Abnormal; Notable for the following components:   Retic Ct Pct 9.2 (*)    RBC. 3.45 (*)    Retic Count, Absolute 318.5 (*)    Immature Retic Fract 38.3 (*)    All other components within normal limits  RESP PANEL BY RT-PCR (RSV, FLU A&B, COVID)  RVPGX2  HCG, SERUM, QUALITATIVE    EKG None  Radiology DG Chest Portable 1 View Result Date: 11/09/2023 CLINICAL DATA:  Concern for pneumonia.  Sickle cell crisis. EXAM: PORTABLE CHEST 1 VIEW COMPARISON:  Chest radiograph dated 08/25/2021. FINDINGS: No focal consolidation, pleural effusion, or pneumothorax. The cardiac silhouette is within normal limits. No acute osseous pathology. IMPRESSION: No active disease. Electronically Signed   By: Elgie Collard M.D.   On: 11/09/2023 15:46    Procedures Procedures    Medications Ordered in ED Medications  sodium chloride 0.9 % bolus 1,000 mL (0 mLs Intravenous Stopped 11/09/23 1705)  ondansetron (ZOFRAN) injection 4 mg (4 mg Intravenous Given 11/09/23 1545)  morphine (PF) 4 MG/ML injection 4 mg (4 mg Intravenous Given 11/09/23 1548)  morphine (PF) 4 MG/ML injection 4 mg (4 mg  Intravenous Given 11/09/23 1643)    ED Course/ Medical Decision Making/ A&P Clinical Course as of 11/09/23 1711  Wed Nov 09, 2023  1709 Chest x-ray looks clear.  COVID RSV flu negative.  Pregnancy negative.  Laboratory workup consistent with known history of sickle cell disease.  No significant metabolic disturbance on BMP.  Reevaluated patient after IV fluids 2 doses of morphine and Zofran.  She feels much better.  No recurrence of nausea or vomiting she is able to drink well.  Will discharge with prescription  for Zofran and instruction for PCP follow-up.  Return precautions were discussed in detail that would be worrisome for recurrence or worsening of sickle cell crisis [MP]    Clinical Course User Index [MP] Royanne Foots, DO                                 Medical Decision Making 38 year old female with history as above presenting for sickle cell pain crisis in the setting of dehydration secondary to vomiting today.  Vomiting likely due to Augmentin that was prescribed for sinus infection.  She has taken this medication for the last 5 days.  Will perform laboratory workup including CBC BMP reticulocyte count as well as a chest x-ray and COVID/influenza/RSV swab.  Will provide rehydration with IV fluids, Zofran for nausea and morphine to help with her pain crisis.  If she is feeling better will instruct her to discontinue the Augmentin and follow-up with her PCP  Amount and/or Complexity of Data Reviewed Labs: ordered. Radiology: ordered.  Risk Prescription drug management.           Final Clinical Impression(s) / ED Diagnoses Final diagnoses:  Sickle cell pain crisis [facility])    Rx / DC Orders ED Discharge Orders          Ordered    ondansetron (ZOFRAN) 4 MG tablet  Every 8 hours PRN        11/09/23 1711              Royanne Foots, DO 11/09/23 1711

## 2023-11-09 NOTE — ED Notes (Signed)
 RN reviewed discharge instructions with pt. Pt verbalized understanding and had no further questions. VSS upon discharge.  

## 2023-11-09 NOTE — Patient Instructions (Signed)

## 2023-11-09 NOTE — Telephone Encounter (Addendum)
Chief Complaint: Vomiting Symptoms: N/V, productive cough, weakness. Frequency: 3 emesis episodes today Pertinent Negatives: Patient denies hemoptysis, diarrhea, abdominal pain, head injury, dizziness, dehydration, fever, pregnancy Disposition: [] ED /[] Urgent Care (no appt availability in office) / [x] Appointment(In office/virtual)/ []  [place] [facility]/ [] Home Care/ [] Refused Recommended Disposition /[] [place] [facility]/ []  Follow-up with PCP Additional Notes: Patient complaining of new onset vomiting since this AM. Patient states that she has been sick since last weak and is currently taking antibiotics for a sinus infection. Patient states she took a dose of Pepto bismol today and vomited shortly after. Patient has not eaten or drank anything except for a few small sips of water with no emesis execrations, last full glass of oral intake yesterday. Patient gave temperature of 99.2. Patient denies pregnancy and states to be actively on birth control. Patient advised by this RN to be seen in office today and patient agreeable, verbalizing understanding.  Copied from CRM [id number]. Topic: Clinical - Red Word Triage >> Nov 09, 2023 12:16 PM Geneva B wrote: [facility] that prompted transfer to Nurse Triage:  patient had the flu last week and now she has a sinus infection as well as throwing up Reason for Disposition  [1] MILD or MODERATE vomiting AND [2] present > 48 hours (2 days) (Exception: Mild vomiting with associated diarrhea.)  Answer Assessment - Initial Assessment Questions 1. VOMITING SEVERITY: "How many times have you vomited in the past 24 hours?"     - MILD:  1 - 2 times/day    - MODERATE: 3 - 5 times/day, decreased oral intake without significant weight loss or symptoms of dehydration    - SEVERE: 6 or more times/day, vomits everything or nearly everything, with significant weight loss, symptoms of dehydration      Moderate 2. ONSET: "When did the vomiting begin?"      This  morning 3. FLUIDS: "What fluids or food have you vomited up today?" "Have you been able to keep any fluids down?"     "I tried to take a couple of water" Able to keep that down. 4. ABDOMEN PAIN: "Are your having any abdomen pain?" If Yes : "How bad is it and what does it feel like?" (e.g., crampy, dull, intermittent, constant)      "Numerousness at the tope of my stomach." 5. DIARRHEA: "Is there any diarrhea?" If Yes, ask: "How many times today?"      Denies, "DM 2x, formed" 6. CONTACTS: "Is there anyone else in the family with the same symptoms?"      Denis 7. CAUSE: "What do you think is causing your vomiting?"    "Mucosus from sinus infection hitting my stomach." 8. HYDRATION STATUS: "Any signs of dehydration?" (e.g., dry mouth [not only dry lips], too weak to stand) "When did you last urinate?"     "I urinated right before I called. 9. OTHER SYMPTOMS: "Do you have any other symptoms?" (e.g., fever, headache, vertigo, vomiting blood or coffee grounds, recent head injury)     Cough with mucous, mild weakness, no energy 10. PREGNANCY: "Is there any chance you are pregnant?" "When was your last menstrual period?"       Denies, "I had one Dec 20th and the last Saturday on the 18th Ithought I was having  Protocols used: Vomiting-A-AH  Reason for Disposition  [1] MILD or MODERATE vomiting AND [2] present > 48 hours (2 days) (Exception: Mild vomiting with associated diarrhea.)  Answer Assessment - Initial Assessment Questions 1. VOMITING  SEVERITY: "How many times have you vomited in the past 24 hours?"     - MILD:  1 - 2 times/day    - MODERATE: 3 - 5 times/day, decreased oral intake without significant weight loss or symptoms of dehydration    - SEVERE: 6 or more times/day, vomits everything or nearly everything, with significant weight loss, symptoms of dehydration      Moderate - 3 times today 2. ONSET: "When did the vomiting begin?"      This morning. 3. FLUIDS: "What fluids or food  have you vomited up today?" "Have you been able to keep any fluids down?"     "I tried to take a couple of sips of water," pt able to keep down. Last had full glass of water day prior, no vomiting at that time. 4. ABDOMEN PAIN: "Are your having any abdomen pain?" If Yes : "How bad is it and what does it feel like?" (e.g., crampy, dull, intermittent, constant)      Denies, "it's just nauseous at the top of my stomach." 5. DIARRHEA: "Is there any diarrhea?" If Yes, ask: "How many times today?"      Denies, "DM 2x today, formed." 6. CONTACTS: "Is there anyone else in the family with the same symptoms?"      Denies 7. CAUSE: "What do you think is causing your vomiting?"    Unsure, "Maybe it's the mucosus from sinus infection hitting my stomach. I'm not too sure." Pt states she was sick about a week ago and is currently on abx therepy for sinus infection but has not taken her dose today due to n/v. 8. HYDRATION STATUS: "Any signs of dehydration?" (e.g., dry mouth [not only dry lips], too weak to stand) "When did you last urinate?"     "I urinated right before I called." 9. OTHER SYMPTOMS: "Do you have any other symptoms?" (e.g., fever, headache, vertigo, vomiting blood or coffee grounds, recent head injury)     Cough with mucous, mild weakness, no energy, "Just not feeling good." 10. PREGNANCY: "Is there any chance you are pregnant?" "When was your last menstrual period?"       Denies, "I had one [period] on Dec 20th and then last Saturday on the 18th I thought I was having one but  Protocols used: Vomiting-A-AH

## 2023-11-09 NOTE — Assessment & Plan Note (Addendum)
Patient is ill appearing and due to her history of sickle cell patient sent to the emergency room at [facility] for further evaluation and treatment. She was taken to the ER on a wheelchair  by a CMA

## 2023-11-09 NOTE — ED Triage Notes (Signed)
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

## 2023-11-09 NOTE — Progress Notes (Addendum)
Acute Office Visit  Subjective:     Patient ID: Krista [name], female    DOB: Dec 20, 1985, 38 y.o.   MRN: [id number]  Chief Complaint  Patient presents with   Emesis    Started today had flu last week and sinus infection after that     Emesis  Associated symptoms include abdominal pain. Pertinent negatives include no chest pain, coughing or fever.   Ms. Lartigue  has a past medical history of Chronic pain, Eczema, Heart murmur, History of blood transfusion, Missed ab, Sickle cell anemia (HCC), Vitamin D deficiency, and Wears glasses.  Patient presents with complaints of nausea, vomiting that started today, states that she is unable to keep anything down in the water.  Stated that she had flu last week and she is currently taking Augmentin for a sinus infection.  She also complained of generalized dull aching pain in her abdomen.  She denies fever, diarrhea.  Thinks that she might be having sickle cell crisis.     Review of Systems  Constitutional:  Positive for fatigue. Negative for diaphoresis, fever and unexpected weight change.  Respiratory:  Negative for cough, shortness of breath and wheezing.   Cardiovascular:  Negative for chest pain, palpitations and leg swelling.  Gastrointestinal:  Positive for abdominal pain, nausea and vomiting. Negative for constipation.  Genitourinary:  Negative for difficulty urinating, dysuria, flank pain and frequency.  Skin:  Negative for color change, pallor, rash and wound.  Psychiatric/Behavioral:  Negative for behavioral problems, confusion, self-injury and suicidal ideas.         Objective:    BP 120/61   Pulse (!) 111   Temp 97.9 F (36.6 C)   Wt 170 lb (77.1 kg)   SpO2 99%   BMI 26.63 kg/m    Physical Exam Vitals and nursing note reviewed.  Constitutional:      Appearance: She is ill-appearing. She is not toxic-appearing or diaphoretic.  Eyes:     General:        Right eye: No discharge.        Left eye: No discharge.      Extraocular Movements: Extraocular movements intact.     Conjunctiva/sclera: Conjunctivae normal.  Cardiovascular:     Rate and Rhythm: Normal rate and regular rhythm.     Pulses: Normal pulses.     Heart sounds: Normal heart sounds. No murmur heard.    No friction rub. No gallop.  Pulmonary:     Effort: Pulmonary effort is normal. No respiratory distress.     Breath sounds: Normal breath sounds. No stridor. No wheezing, rhonchi or rales.  Chest:     Chest wall: No tenderness.  Abdominal:     General: There is no distension.     Palpations: Abdomen is soft.     Tenderness: There is abdominal tenderness. There is no right CVA tenderness, left CVA tenderness or guarding.     Comments: Generalized tenderness  Musculoskeletal:        General: No swelling, deformity or signs of injury.     Right lower leg: No edema.     Left lower leg: No edema.     Comments: Laying in the examination table   Skin:    General: Skin is warm and dry.     Capillary Refill: Capillary refill takes less than 2 seconds.     Coloration: Skin is not jaundiced or pale.     Findings: No bruising, erythema or lesion.  Neurological:  Mental Status: She is alert and oriented to person, place, and time.  Psychiatric:        Mood and Affect: Mood normal.        Behavior: Behavior normal.        Thought Content: Thought content normal.        Judgment: Judgment normal.     No results found for any visits on 11/09/23.      Assessment & Plan:   Problem List Items Addressed This Visit       Digestive   Nausea and vomiting - Primary   Patient is ill appearing and due to her history of sickle cell patient sent to the emergency room at [facility] for further evaluation and treatment. She was taken to the ER on a wheelchair  by a CMA        No orders of the defined types were placed in this encounter.   No follow-ups on file.  Donell Beers, FNP

## 2023-11-09 NOTE — Discharge Instructions (Addendum)
You were seen in the emergency department for sickle cell pain crisis Your pregnancy test was negative Your COVID influenza RSV test was negative Chest x-ray looked okay Labs from blood draw looks similar to previous labs and are consistent with known history of sickle cell disease Your symptoms improved after IV fluids, Zofran and morphine You should stop taking the antibiotic that was prescribed for the sinus infection as this can cause nausea and vomiting Continue taking all previous prescribed medications otherwise Follow-up with your primary care doctor within 1 week for reevaluation Return to the emergency department for recurrent pain crisis, trouble breathing, chest pain or any other concerns

## 2023-11-15 ENCOUNTER — Ambulatory Visit: Payer: Self-pay | Admitting: Nurse Practitioner

## 2024-01-10 ENCOUNTER — Ambulatory Visit: Payer: Self-pay | Admitting: Nurse Practitioner

## 2024-01-17 ENCOUNTER — Ambulatory Visit (INDEPENDENT_AMBULATORY_CARE_PROVIDER_SITE_OTHER): Payer: Self-pay | Admitting: Nurse Practitioner

## 2024-01-17 ENCOUNTER — Encounter: Payer: Self-pay | Admitting: Nurse Practitioner

## 2024-01-17 VITALS — BP 121/63 | HR 93 | Temp 97.4°F | Wt 176.0 lb

## 2024-01-17 DIAGNOSIS — H30032 Focal chorioretinal inflammation, peripheral, left eye: Secondary | ICD-10-CM | POA: Diagnosis not present

## 2024-01-17 DIAGNOSIS — Z833 Family history of diabetes mellitus: Secondary | ICD-10-CM

## 2024-01-17 DIAGNOSIS — Z23 Encounter for immunization: Secondary | ICD-10-CM | POA: Diagnosis not present

## 2024-01-17 DIAGNOSIS — D571 Sickle-cell disease without crisis: Secondary | ICD-10-CM | POA: Diagnosis not present

## 2024-01-17 NOTE — Patient Instructions (Signed)

## 2024-01-17 NOTE — Assessment & Plan Note (Signed)
Fructosamine

## 2024-01-17 NOTE — Assessment & Plan Note (Signed)
 Followed by ophthalmologist Continue azathioprine 50 mg daily

## 2024-01-17 NOTE — Assessment & Plan Note (Signed)
 Patient educated on CDC recommendation for the vaccine. Verbal consent was obtained from the patient, vaccine administered by nurse, no sign of adverse reactions noted at this time. Patient education on arm soreness and use of tylenol or ibuprofen for this patient  was discussed. Patient educated on the signs and symptoms of adverse effect and advise to contact the office if they occur.

## 2024-01-17 NOTE — Progress Notes (Signed)
 Established Patient Office Visit  Subjective:  Patient ID: Krista [name], female    DOB: Feb 26, 1986  Age: 38 y.o. MRN: [id number]  CC:  Chief Complaint  Patient presents with   Sickle Cell Anemia    HPI Krista [name] is a 38 y.o. female  has a past medical history of Chronic pain, Eczema, Heart murmur, History of blood transfusion, Missed ab, Sickle cell anemia (HCC), Vitamin D deficiency, and Wears glasses.  Patient presents for follow-up for her chronic medical conditions  Sickle cell disease.  Currently on hydroxyurea 500 mg daily, folic acid 1 mg daily takes ibuprofen 800 mg 3 times daily as needed mild pain and oxycodone 5 mg every 4 hours as needed.  Rarely needs oxycodone.  She currently denies fever, chills, chest pain, shortness of breath abdominal pain, nausea, vomiting.  Up-to-date with annual eye exam  Due for cervical cancer screening, states that she has an OB/GYN that she sees, encouraged to get her cervical cancer screening completed    Past Medical History:  Diagnosis Date   Chronic pain    with sickle cell    Eczema    Heart murmur    07-13-2019  per pt since childhood,  has not ever had a echo done ,  asymptomatic   History of blood transfusion    x3    Missed ab    Sickle cell anemia (HCC)    followed by pcp---  last crisis 12-14-2018   Vitamin D deficiency    Wears glasses     Past Surgical History:  Procedure Laterality Date   EYE SURGERY Left 11/12/2019   LAPAROSCOPIC CHOLECYSTECTOMY  1997    Family History  Problem Relation Age of Onset   Diabetes Father    Hypertension Father    Heart disease Brother    Cancer Maternal Grandmother    Heart disease Brother    Breast cancer Paternal Aunt    Cancer Paternal Uncle     Social History   Socioeconomic History   Marital status: Married    Spouse name: n/a   Number of children: 0   Years of education: 16+   Highest education level: Master's degree (e.g., MA, MS, MEng, MEd, MSW, MBA)   Occupational History   Occupation: TEACHER    Employer: [employer]  Tobacco Use   Smoking status: Never   Smokeless tobacco: Never  Vaping Use   Vaping status: Never Used  Substance and Sexual Activity   Alcohol use: Not Currently    Comment: socially   Drug use: Never   Sexual activity: Yes    Birth control/protection: None  Other Topics Concern   Not on file  Social History Narrative   ** Merged History Encounter **       Lives alone.  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 Currently teaches at [employer].   Social Drivers of Corporate investment banker Strain: Low Risk  (01/07/2023)   Overall Financial Resource Strain (CARDIA)    Difficulty of Paying Living Expenses: Not hard at all  Food Insecurity: No Food Insecurity (01/07/2023)   Hunger Vital Sign    Worried About Running Out of Food in the Last Year: Never true    Ran Out of Food in the Last Year: Never true  Transportation Needs: No Transportation Needs (01/07/2023)   PRAPARE - Administrator, Civil Service (Medical): No    Lack of Transportation (Non-Medical): No  Physical Activity: Unknown (01/07/2023)   Exercise Vital Sign    Days of Exercise per Week: 0 days    Minutes of Exercise per Session: Not on file  Stress: No Stress Concern Present (01/07/2023)   [employer] of Occupational Health - Occupational Stress Questionnaire    Feeling of Stress : Not at all  Social Connections: Socially Integrated (01/07/2023)   Social Connection and Isolation Panel [NHANES]    Frequency of Communication with Friends and Family: More than three times a week    Frequency of Social Gatherings with Friends and Family: Once a week    Attends Religious Services: More than 4 times per year    Active Member of [employer] or Organizations: Yes    Attends Banker Meetings: 1 to 4 times per year    Marital Status: Married  Catering manager Violence: Unknown  (01/19/2022)   Received from [facility]   HITS    Physically Hurt: Not on file    Insult or Talk Down To: Not on file    Threaten Physical Harm: Not on file    Scream or Curse: Not on file    Outpatient Medications Prior to Visit  Medication Sig Dispense Refill   azaTHIOprine (IMURAN) 50 MG tablet Take 50 mg by mouth daily.     EQ ASPIRIN ADULT LOW DOSE 81 MG tablet TAKE 1 TABLET BY MOUTH ONCE DAILY SWALLOW  WHOLE 90 tablet 0   folic acid (FOLVITE) 1 MG tablet Take 1 tablet by mouth once daily 90 tablet 0   hydroxyurea (HYDREA) 500 MG capsule Take 1 capsule (500 mg total) by mouth daily. May take with food to minimize GI side effects. 90 capsule 1   ibuprofen (ADVIL) 800 MG tablet Take 1 tablet (800 mg total) by mouth every 8 (eight) hours as needed. 60 tablet 1   levonorgestrel (MIRENA, 52 MG,) 20 MCG/DAY IUD Mirena 20 mcg/24 hours (7 yrs) 52 mg intrauterine device  Take 1 device by intrauterine route.     triamcinolone ointment (KENALOG) 0.5 % Apply 1 application topically 2 (two) times daily. 30 g 3   Vitamin D, Ergocalciferol, (DRISDOL) 1.25 MG (50000 UNIT) CAPS capsule Take 1 capsule by mouth once a week 12 capsule 0   fluticasone (FLONASE) 50 MCG/ACT nasal spray 1 SPRAY IN EACH NOSTRIL EVERY 12HRS AS NEEDED (Patient not taking: Reported on 01/17/2024)     oxycodone (OXY-IR) 5 MG capsule Take 1 capsule (5 mg total) by mouth every 4 (four) hours as needed. (Patient not taking: Reported on 01/17/2024) 30 capsule 0   benzonatate (TESSALON) 200 MG capsule Take 1 capsule (200 mg total) by mouth 2 (two) times daily as needed for cough. (Patient not taking: Reported on 01/10/2023) 20 capsule 0   brompheniramine-pseudoephedrine-DM 30-2-10 MG/5ML syrup Take by mouth. (Patient not taking: Reported on 01/17/2024)     camphor-menthol (SARNA) lotion Apply 1 application topically as needed for itching. (Patient not taking: Reported on 05/05/2022) 222 mL 0   clobetasol ointment (TEMOVATE) 0.05  % Apply 1 application topically 2 (two) times daily. (Patient not taking: Reported on 01/17/2024) 60 g 2   No facility-administered medications prior to visit.    Allergies  Allergen Reactions   Keflex [Cephalexin] Swelling    Face swelling    ROS Review of Systems  Constitutional:  Negative for appetite change, chills, fatigue and fever.  HENT:  Negative for congestion, postnasal drip, rhinorrhea and sneezing.   Respiratory:  Negative for  cough, shortness of breath and wheezing.   Cardiovascular:  Negative for chest pain, palpitations and leg swelling.  Gastrointestinal:  Negative for abdominal pain, constipation, nausea and vomiting.  Genitourinary:  Negative for difficulty urinating, dysuria, flank pain and frequency.  Musculoskeletal:  Negative for arthralgias, back pain, joint swelling and myalgias.  Skin:  Negative for color change, pallor, rash and wound.  Neurological:  Negative for dizziness, facial asymmetry, weakness, numbness and headaches.  Psychiatric/Behavioral:  Negative for behavioral problems, confusion, self-injury and suicidal ideas.       Objective:    Physical Exam Vitals and nursing note reviewed.  Constitutional:      General: She is not in acute distress.    Appearance: Normal appearance. She is not ill-appearing, toxic-appearing or diaphoretic.  HENT:     Mouth/Throat:     Mouth: Mucous membranes are moist.     Pharynx: Oropharynx is clear. No oropharyngeal exudate or posterior oropharyngeal erythema.  Eyes:     General: No scleral icterus.       Right eye: No discharge.        Left eye: No discharge.     Extraocular Movements: Extraocular movements intact.     Conjunctiva/sclera: Conjunctivae normal.  Cardiovascular:     Rate and Rhythm: Normal rate and regular rhythm.     Pulses: Normal pulses.     Heart sounds: Normal heart sounds. No murmur heard.    No friction rub. No gallop.  Pulmonary:     Effort: Pulmonary effort is normal. No  respiratory distress.     Breath sounds: Normal breath sounds. No stridor. No wheezing, rhonchi or rales.  Chest:     Chest wall: No tenderness.  Abdominal:     General: There is no distension.     Palpations: Abdomen is soft.     Tenderness: There is no abdominal tenderness. There is no right CVA tenderness, left CVA tenderness or guarding.  Musculoskeletal:        General: No swelling, tenderness, deformity or signs of injury.     Right lower leg: No edema.     Left lower leg: No edema.  Skin:    General: Skin is warm and dry.     Capillary Refill: Capillary refill takes less than 2 seconds.     Coloration: Skin is not jaundiced or pale.     Findings: No bruising, erythema or lesion.  Neurological:     Mental Status: She is alert and oriented to person, place, and time.     Motor: No weakness.     Coordination: Coordination normal.     Gait: Gait normal.  Psychiatric:        Mood and Affect: Mood normal.        Behavior: Behavior normal.        Thought Content: Thought content normal.        Judgment: Judgment normal.     BP 121/63   Pulse 93   Temp (!) 97.4 F (36.3 C)   Wt 176 lb (79.8 kg)   SpO2 98%   Breastfeeding No   BMI 27.57 kg/m  Wt Readings from Last 3 Encounters:  01/17/24 176 lb (79.8 kg)  11/09/23 170 lb (77.1 kg)  11/09/23 170 lb (77.1 kg)    Lab Results  Component Value Date   TSH 2.21 03/24/2017   Lab Results  Component Value Date   WBC 16.3 (H) 11/09/2023   HGB 10.7 (L) 11/09/2023   HCT 31.9 (L)  11/09/2023   MCV 93.3 11/09/2023   PLT 903 (HH) 11/09/2023   Lab Results  Component Value Date   NA 141 11/09/2023   K 3.7 11/09/2023   CO2 24 11/09/2023   GLUCOSE 114 (H) 11/09/2023   BUN 8 11/09/2023   CREATININE 0.55 11/09/2023   BILITOT 1.9 (H) 07/12/2023   ALKPHOS 99 07/12/2023   AST 32 07/12/2023   ALT 28 07/12/2023   PROT 7.5 07/12/2023   ALBUMIN 4.9 07/12/2023   CALCIUM 9.4 11/09/2023   ANIONGAP 10 11/09/2023   EGFR 123  07/12/2023   Lab Results  Component Value Date   CHOL 112 (L) 08/12/2015   Lab Results  Component Value Date   HDL 26 (L) 08/12/2015   Lab Results  Component Value Date   LDLCALC 67 08/12/2015   Lab Results  Component Value Date   TRIG 95 08/12/2015   Lab Results  Component Value Date   CHOLHDL 4.3 08/12/2015   Lab Results  Component Value Date   HGBA1C <4.0 03/24/2017      Assessment & Plan:   Problem List Items Addressed This Visit       Other   Hb-SS disease without crisis (HCC)   We discussed the need for good hydration, monitoring of hydration status, avoidance of heat, cold, stress, and infection triggers. We discussed the need to be adherent with taking Hydrea and other home medications. Patient was reminded of the need to seek medical attention immediately if any symptom of bleeding, anemia, or infection occurs.    1. Need for pneumococcal 20-valent conjugate vaccination (Primary)  - Pneumococcal conjugate vaccine 20-valent  2. Hb-SS disease without crisis (HCC)  - Sickle Cell Panel  Follow-up in 3 months with PCP      Relevant Orders   Sickle Cell Panel   Need for pneumococcal 20-valent conjugate vaccination - Primary   Patient educated on CDC recommendation for the vaccine. Verbal consent was obtained from the patient, vaccine administered by nurse, no sign of adverse reactions noted at this time. Patient education on arm soreness and use of tylenol or ibuprofen for this patient  was discussed. Patient educated on the signs and symptoms of adverse effect and advise to contact the office if they occur.       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   Follow-up: No follow-ups on file.    Donell Beers, FNP

## 2024-01-17 NOTE — Assessment & Plan Note (Signed)
 We discussed the need for good hydration, monitoring of hydration status, avoidance of heat, cold, stress, and infection triggers. We discussed the need to be adherent with taking Hydrea and other home medications. Patient was reminded of the need to seek medical attention immediately if any symptom of bleeding, anemia, or infection occurs.    1. Need for pneumococcal 20-valent conjugate vaccination (Primary)  - Pneumococcal conjugate vaccine 20-valent  2. Hb-SS disease without crisis (HCC)  - Sickle Cell Panel  Follow-up in 3 months with PCP

## 2024-01-18 LAB — CMP14+CBC/D/PLT+FER+RETIC+V...
ALT: 12 IU/L (ref 0–32)
AST: 15 IU/L (ref 0–40)
Albumin: 4.7 g/dL (ref 3.9–4.9)
Alkaline Phosphatase: 86 IU/L (ref 44–121)
BUN/Creatinine Ratio: 10 (ref 9–23)
BUN: 6 mg/dL (ref 6–20)
Basophils Absolute: 0.1 10*3/uL (ref 0.0–0.2)
Basos: 1 %
Bilirubin Total: 1.8 mg/dL — ABNORMAL HIGH (ref 0.0–1.2)
CO2: 21 mmol/L (ref 20–29)
Calcium: 9.6 mg/dL (ref 8.7–10.2)
Chloride: 106 mmol/L (ref 96–106)
Creatinine, Ser: 0.61 mg/dL (ref 0.57–1.00)
EOS (ABSOLUTE): 0 10*3/uL (ref 0.0–0.4)
Eos: 0 %
Ferritin: 437 ng/mL — ABNORMAL HIGH (ref 15–150)
Globulin, Total: 2.5 g/dL (ref 1.5–4.5)
Glucose: 101 mg/dL — ABNORMAL HIGH (ref 70–99)
Hematocrit: 28.5 % — ABNORMAL LOW (ref 34.0–46.6)
Hemoglobin: 9.5 g/dL — ABNORMAL LOW (ref 11.1–15.9)
Immature Grans (Abs): 0.1 10*3/uL (ref 0.0–0.1)
Immature Granulocytes: 1 %
Lymphocytes Absolute: 2.5 10*3/uL (ref 0.7–3.1)
Lymphs: 20 %
MCH: 32.2 pg (ref 26.6–33.0)
MCHC: 33.3 g/dL (ref 31.5–35.7)
MCV: 97 fL (ref 79–97)
Monocytes Absolute: 0.9 10*3/uL (ref 0.1–0.9)
Monocytes: 8 %
NRBC: 4 % — ABNORMAL HIGH (ref 0–0)
Neutrophils Absolute: 8.6 10*3/uL — ABNORMAL HIGH (ref 1.4–7.0)
Neutrophils: 70 %
Platelets: 707 10*3/uL — ABNORMAL HIGH (ref 150–450)
Potassium: 5 mmol/L (ref 3.5–5.2)
RBC: 2.95 x10E6/uL — ABNORMAL LOW (ref 3.77–5.28)
RDW: 14.8 % (ref 11.7–15.4)
Retic Ct Pct: 11.9 % — ABNORMAL HIGH (ref 0.6–2.6)
Sodium: 142 mmol/L (ref 134–144)
Total Protein: 7.2 g/dL (ref 6.0–8.5)
Vit D, 25-Hydroxy: 41.3 ng/mL (ref 30.0–100.0)
WBC: 12.3 10*3/uL — ABNORMAL HIGH (ref 3.4–10.8)
eGFR: 118 mL/min/{1.73_m2} (ref >59)

## 2024-01-18 LAB — FRUCTOSAMINE: Fructosamine: 260 umol/L (ref 0–285)

## 2024-02-05 ENCOUNTER — Other Ambulatory Visit: Payer: Self-pay | Admitting: Family Medicine

## 2024-02-05 DIAGNOSIS — D571 Sickle-cell disease without crisis: Secondary | ICD-10-CM

## 2024-02-06 ENCOUNTER — Other Ambulatory Visit: Payer: Self-pay | Admitting: Nurse Practitioner

## 2024-02-06 DIAGNOSIS — E559 Vitamin D deficiency, unspecified: Secondary | ICD-10-CM

## 2024-02-09 ENCOUNTER — Encounter: Payer: Self-pay | Admitting: Nurse Practitioner

## 2024-02-09 ENCOUNTER — Ambulatory Visit (INDEPENDENT_AMBULATORY_CARE_PROVIDER_SITE_OTHER): Payer: Self-pay | Admitting: Nurse Practitioner

## 2024-02-09 VITALS — BP 110/80 | HR 67 | Temp 97.8°F | Wt 172.6 lb

## 2024-02-09 DIAGNOSIS — E663 Overweight: Secondary | ICD-10-CM | POA: Diagnosis not present

## 2024-02-09 DIAGNOSIS — R61 Generalized hyperhidrosis: Secondary | ICD-10-CM | POA: Diagnosis not present

## 2024-02-09 DIAGNOSIS — Z1329 Encounter for screening for other suspected endocrine disorder: Secondary | ICD-10-CM

## 2024-02-09 NOTE — Progress Notes (Signed)
 Subjective   Patient ID: Krista [name], female    DOB: May 30, 1986, 38 y.o.   MRN: [id number]  Chief Complaint  Patient presents with   Weight Loss    Patient stated that she would like to lose weight in her stomach   Night Sweats    Patient stated that when she sweat at night it smells like antibiotics     Referring provider: Jerrlyn Morel, NP  Krista [name] is a 38 y.o. female with Past Medical History: No date: Chronic pain     Comment:  with sickle cell  No date: Eczema No date: Heart murmur     Comment:  07-13-2019  per pt since childhood,  has not ever had a               echo done ,  asymptomatic No date: History of blood transfusion     Comment:  x3  No date: Missed ab No date: Sickle cell anemia (HCC)     Comment:  followed by pcp---  last crisis 12-14-2018 No date: Vitamin D  deficiency No date: Wears glasses   HPI  Patient presents today for an acute visit.  She is concerned about her weight.  Her BMI is 27.03 today.  We discussed that this is in the overweight range and we will refer her to healthy weight and wellness.  Patient is also concerned about night sweats.  She states that this has been ongoing for a few weeks.  We will recheck labs including fructosamine level.Denies f/c/s, n/v/d, hemoptysis, PND, leg swelling Denies chest pain or edema    Allergies  Allergen Reactions   Keflex  [Cephalexin ] Swelling    Face swelling    Immunization History  Administered Date(s) Administered   Influenza,inj,Quad PF,6+ Mos 08/12/2015, 10/15/2016, 06/08/2017, 08/21/2019, 08/26/2020, 07/14/2021, 07/12/2023   PFIZER Comirnaty(Gray Top)Covid-19 Tri-Sucrose Vaccine 07/05/2020, 07/26/2020   PNEUMOCOCCAL CONJUGATE-20 01/17/2024   PPD Test 05/22/2015   Pneumococcal Polysaccharide-23 08/12/2015, 05/06/2018, 04/14/2021   Tdap 03/06/2020    Tobacco History: Social History   Tobacco Use  Smoking Status Never  Smokeless Tobacco Never   Counseling given: Not  Answered   Outpatient Encounter Medications as of 02/09/2024  Medication Sig   azaTHIOprine (IMURAN) 50 MG tablet Take 50 mg by mouth daily.   EQ ASPIRIN  ADULT LOW DOSE 81 MG tablet TAKE 1 TABLET BY MOUTH ONCE DAILY SWALLOW  WHOLE   folic acid  (FOLVITE ) 1 MG tablet Take 1 tablet by mouth once daily   hydroxyurea  (HYDREA ) 500 MG capsule TAKE 1 CAPSULE BY MOUTH ONCE DAILY MAY  TAKE  WITH  FOOD  TO  MINIMIZE  GI  SIDE  EFFECTS   ibuprofen  (ADVIL ) 800 MG tablet Take 1 tablet (800 mg total) by mouth every 8 (eight) hours as needed.   levonorgestrel (MIRENA, 52 MG,) 20 MCG/DAY IUD Mirena 20 mcg/24 hours (7 yrs) 52 mg intrauterine device  Take 1 device by intrauterine route.   oxycodone  (OXY-IR) 5 MG capsule Take 1 capsule (5 mg total) by mouth every 4 (four) hours as needed.   triamcinolone  ointment (KENALOG ) 0.5 % Apply 1 application topically 2 (two) times daily.   Vitamin D , Ergocalciferol , (DRISDOL ) 1.25 MG (50000 UNIT) CAPS capsule Take 1 capsule by mouth once a week   fluticasone  (FLONASE ) 50 MCG/ACT nasal spray 1 SPRAY IN EACH NOSTRIL EVERY 12HRS AS NEEDED (Patient not taking: Reported on 02/09/2024)   No facility-administered encounter medications on file as of 02/09/2024.    Review of  Systems  Review of Systems  Constitutional: Negative.   HENT: Negative.    Cardiovascular: Negative.   Gastrointestinal: Negative.   Allergic/Immunologic: Negative.   Neurological: Negative.   Psychiatric/Behavioral: Negative.       Objective:   BP 110/80   Pulse 67   Temp 97.8 F (36.6 C) (Oral)   Wt 172 lb 9.6 oz (78.3 kg)   SpO2 100%   BMI 27.03 kg/m   Wt Readings from Last 5 Encounters:  02/09/24 172 lb 9.6 oz (78.3 kg)  01/17/24 176 lb (79.8 kg)  11/09/23 170 lb (77.1 kg)  11/09/23 170 lb (77.1 kg)  07/12/23 170 lb (77.1 kg)     Physical Exam Vitals and nursing note reviewed.  Constitutional:      General: She is not in acute distress.    Appearance: She is well-developed.   Cardiovascular:     Rate and Rhythm: Normal rate and regular rhythm.  Pulmonary:     Effort: Pulmonary effort is normal.     Breath sounds: Normal breath sounds.  Neurological:     Mental Status: She is alert and oriented to person, place, and time.       Assessment & Plan:   Overweight (BMI 25.0-29.9) -     Amb Ref to Medical Weight Management -     CBC -     Comprehensive metabolic panel with GFR  Night sweats -     CBC -     Comprehensive metabolic panel with GFR -     Fructosamine  Thyroid disorder screen -     TSH     Return if symptoms worsen or fail to improve.   Jerrlyn Morel, NP 02/09/2024

## 2024-02-09 NOTE — Patient Instructions (Signed)
 1. Overweight (BMI 25.0-29.9) (Primary)  - Amb Ref to Medical Weight Management - CBC - Comprehensive metabolic panel with GFR  2. Night sweats  - CBC - Comprehensive metabolic panel with GFR - Fructosamine  3. Thyroid disorder screen  - TSH

## 2024-02-10 LAB — CBC
Hematocrit: 29.2 % — ABNORMAL LOW (ref 34.0–46.6)
Hemoglobin: 9.9 g/dL — ABNORMAL LOW (ref 11.1–15.9)
MCH: 32.4 pg (ref 26.6–33.0)
MCHC: 33.9 g/dL (ref 31.5–35.7)
MCV: 95 fL (ref 79–97)
NRBC: 4 % — ABNORMAL HIGH (ref 0–0)
Platelets: 714 10*3/uL — ABNORMAL HIGH (ref 150–450)
RBC: 3.06 x10E6/uL — ABNORMAL LOW (ref 3.77–5.28)
RDW: 14 % (ref 11.7–15.4)
WBC: 10.7 10*3/uL (ref 3.4–10.8)

## 2024-02-10 LAB — COMPREHENSIVE METABOLIC PANEL WITH GFR
ALT: 17 IU/L (ref 0–32)
AST: 23 IU/L (ref 0–40)
Albumin: 5.1 g/dL — ABNORMAL HIGH (ref 3.9–4.9)
Alkaline Phosphatase: 87 IU/L (ref 44–121)
BUN/Creatinine Ratio: 12 (ref 9–23)
BUN: 6 mg/dL (ref 6–20)
Bilirubin Total: 2.1 mg/dL — ABNORMAL HIGH (ref 0.0–1.2)
CO2: 17 mmol/L — ABNORMAL LOW (ref 20–29)
Calcium: 9.6 mg/dL (ref 8.7–10.2)
Chloride: 103 mmol/L (ref 96–106)
Creatinine, Ser: 0.5 mg/dL — ABNORMAL LOW (ref 0.57–1.00)
Globulin, Total: 2.5 g/dL (ref 1.5–4.5)
Glucose: 93 mg/dL (ref 70–99)
Potassium: 4.6 mmol/L (ref 3.5–5.2)
Sodium: 139 mmol/L (ref 134–144)
Total Protein: 7.6 g/dL (ref 6.0–8.5)
eGFR: 124 mL/min/{1.73_m2} (ref >59)

## 2024-02-10 LAB — TSH: TSH: 1.59 u[IU]/mL (ref 0.450–4.500)

## 2024-02-10 LAB — FRUCTOSAMINE: Fructosamine: 264 umol/L (ref 0–285)

## 2024-02-19 ENCOUNTER — Other Ambulatory Visit: Payer: Self-pay | Admitting: Nurse Practitioner

## 2024-03-15 ENCOUNTER — Other Ambulatory Visit: Payer: Self-pay | Admitting: Nurse Practitioner

## 2024-03-15 DIAGNOSIS — D571 Sickle-cell disease without crisis: Secondary | ICD-10-CM

## 2024-03-26 ENCOUNTER — Other Ambulatory Visit: Payer: Self-pay | Admitting: Nurse Practitioner

## 2024-03-26 DIAGNOSIS — D571 Sickle-cell disease without crisis: Secondary | ICD-10-CM

## 2024-04-23 ENCOUNTER — Ambulatory Visit: Payer: Self-pay | Admitting: Nurse Practitioner

## 2024-04-29 ENCOUNTER — Other Ambulatory Visit: Payer: Self-pay | Admitting: Nurse Practitioner

## 2024-04-29 DIAGNOSIS — D571 Sickle-cell disease without crisis: Secondary | ICD-10-CM

## 2024-04-30 NOTE — Telephone Encounter (Signed)
 Please advise [facility]

## 2024-05-17 ENCOUNTER — Ambulatory Visit (INDEPENDENT_AMBULATORY_CARE_PROVIDER_SITE_OTHER): Admitting: Nurse Practitioner

## 2024-05-17 ENCOUNTER — Encounter: Payer: Self-pay | Admitting: Nurse Practitioner

## 2024-05-17 VITALS — BP 105/76 | HR 88 | Temp 98.4°F | Wt 177.0 lb

## 2024-05-17 DIAGNOSIS — D571 Sickle-cell disease without crisis: Secondary | ICD-10-CM

## 2024-05-17 DIAGNOSIS — S80219A Abrasion, unspecified knee, initial encounter: Secondary | ICD-10-CM | POA: Diagnosis not present

## 2024-05-17 DIAGNOSIS — E559 Vitamin D deficiency, unspecified: Secondary | ICD-10-CM

## 2024-05-17 MED ORDER — ASPIRIN 81 MG PO TBEC: 81.0000 mg | DELAYED_RELEASE_TABLET | Freq: Every day | ORAL | 0 refills | Status: DC

## 2024-05-17 MED ORDER — VITAMIN D (ERGOCALCIFEROL) 1.25 MG (50000 UNIT) PO CAPS: 50000.0000 [IU] | ORAL_CAPSULE | ORAL | 0 refills | Status: DC

## 2024-05-17 MED ORDER — MUPIROCIN 2 % EX OINT: 1.0000 | TOPICAL_OINTMENT | Freq: Two times a day (BID) | CUTANEOUS | 0 refills | Status: AC

## 2024-05-17 MED ORDER — HYDROXYUREA 500 MG PO CAPS: 500.0000 mg | ORAL_CAPSULE | Freq: Every day | ORAL | 0 refills | Status: DC

## 2024-05-17 MED ORDER — FOLIC ACID 1 MG PO TABS: 1.0000 mg | ORAL_TABLET | Freq: Every day | ORAL | 0 refills | Status: DC

## 2024-05-17 NOTE — Progress Notes (Signed)
 Subjective   Patient ID: Krista [name], female    DOB: Jul 04, 1986, 38 y.o.   MRN: [id number]  Chief Complaint  Patient presents with   Medical Management of Chronic Issues    Regular check up    Referring provider: Oley Bascom RAMAN, NP  [name] is a 38 y.o. female with Past Medical History: No date: Chronic pain     Comment:  with sickle cell  No date: Eczema No date: Heart murmur     Comment:  07-13-2019  per pt since childhood,  has not ever had a               echo done ,  asymptomatic No date: History of blood transfusion     Comment:  x3  No date: Missed ab No date: Sickle cell anemia (HCC)     Comment:  followed by pcp---  last crisis 12-14-2018 No date: Vitamin D  deficiency No date: Wears glasses   HPI  Krista [name] is a 38 y.o. female  has a past medical history of Chronic pain, Eczema, Heart murmur, History of blood transfusion, Missed ab, Sickle cell anemia (HCC), Vitamin D  deficiency, and Wears glasses.  Patient presents for follow-up for her chronic medical conditions   Sickle cell disease.  Currently on hydroxyurea  500 mg daily, folic acid  1 mg daily takes ibuprofen  800 mg 3 times daily as needed mild pain and oxycodone  5 mg every 4 hours as needed.  Rarely needs oxycodone .  She currently denies fever, chills, chest pain, shortness of breath abdominal pain, nausea, vomiting.  Up-to-date with annual eye exam. Denies f/c/s, n/v/d, hemoptysis, PND, leg swelling Denies chest pain or edema  Note: Patient does complain today of abrasions to bilateral knees.  She fell while walking down the street.  She states that the areas are healing but does have not completely healed yet.  We will order Bactroban  ointment.   Allergies  Allergen Reactions   Keflex  [Cephalexin ] Swelling    Face swelling    Immunization History  Administered Date(s) Administered   Influenza,inj,Quad PF,6+ Mos 08/12/2015, 10/15/2016, 06/08/2017, 08/21/2019, 08/26/2020, 07/14/2021,  07/12/2023   PFIZER Comirnaty(Gray Top)Covid-19 Tri-Sucrose Vaccine 07/05/2020, 07/26/2020   PNEUMOCOCCAL CONJUGATE-20 01/17/2024   PPD Test 05/22/2015   Pneumococcal Polysaccharide-23 08/12/2015, 05/06/2018, 04/14/2021   Tdap 03/06/2020    Tobacco History: Social History   Tobacco Use  Smoking Status Never  Smokeless Tobacco Never   Counseling given: Not Answered   Outpatient Encounter Medications as of 05/17/2024  Medication Sig   azaTHIOprine (IMURAN) 50 MG tablet Take 50 mg by mouth daily.   ibuprofen  (ADVIL ) 800 MG tablet Take 1 tablet (800 mg total) by mouth every 8 (eight) hours as needed.   levonorgestrel (MIRENA, 52 MG,) 20 MCG/DAY IUD Mirena 20 mcg/24 hours (7 yrs) 52 mg intrauterine device  Take 1 device by intrauterine route.   mupirocin  ointment (BACTROBAN ) 2 % Apply 1 Application topically 2 (two) times daily.   oxycodone  (OXY-IR) 5 MG capsule Take 1 capsule (5 mg total) by mouth every 4 (four) hours as needed.   triamcinolone  ointment (KENALOG ) 0.5 % Apply 1 application topically 2 (two) times daily.   [DISCONTINUED] EQ ASPIRIN  ADULT LOW DOSE 81 MG tablet TAKE 1 TABLET BY MOUTH ONCE DAILY. SWALLOW WHOLE   [DISCONTINUED] folic acid  (FOLVITE ) 1 MG tablet Take 1 tablet by mouth once daily   [DISCONTINUED] hydroxyurea  (HYDREA ) 500 MG capsule TAKE 1 CAPSULE BY MOUTH ONCE DAILY MAY  TAKE  WITH  FOOD  TO  MINIMIZE  GI  SIDE  EFFECTS   [DISCONTINUED] Vitamin D , Ergocalciferol , (DRISDOL ) 1.25 MG (50000 UNIT) CAPS capsule Take 1 capsule by mouth once a week   aspirin  EC (EQ ASPIRIN  ADULT LOW DOSE) 81 MG tablet Take 1 tablet (81 mg total) by mouth daily. Swallow whole.   fluticasone  (FLONASE ) 50 MCG/ACT nasal spray 1 SPRAY IN EACH NOSTRIL EVERY 12HRS AS NEEDED (Patient not taking: Reported on 02/09/2024)   folic acid  (FOLVITE ) 1 MG tablet Take 1 tablet (1 mg total) by mouth daily.   hydroxyurea  (HYDREA ) 500 MG capsule Take 1 capsule (500 mg total) by mouth daily. May take with  food to minimize GI side effects.   Vitamin D , Ergocalciferol , (DRISDOL ) 1.25 MG (50000 UNIT) CAPS capsule Take 1 capsule (50,000 Units total) by mouth once a week.   No facility-administered encounter medications on file as of 05/17/2024.    Review of Systems  Review of Systems  Constitutional: Negative.   HENT: Negative.    Cardiovascular: Negative.   Gastrointestinal: Negative.   Allergic/Immunologic: Negative.   Neurological: Negative.   Psychiatric/Behavioral: Negative.       Objective:   BP 105/76   Pulse 88   Temp 98.4 F (36.9 C) (Oral)   Wt 177 lb (80.3 kg)   SpO2 100%   BMI 27.72 kg/m   Wt Readings from Last 5 Encounters:  05/17/24 177 lb (80.3 kg)  02/09/24 172 lb 9.6 oz (78.3 kg)  01/17/24 176 lb (79.8 kg)  11/09/23 170 lb (77.1 kg)  11/09/23 170 lb (77.1 kg)     Physical Exam Vitals and nursing note reviewed.  Constitutional:      General: She is not in acute distress.    Appearance: She is well-developed.  Cardiovascular:     Rate and Rhythm: Normal rate and regular rhythm.  Pulmonary:     Effort: Pulmonary effort is normal.     Breath sounds: Normal breath sounds.  Skin:    Findings: Abrasion present.      Neurological:     Mental Status: She is alert and oriented to person, place, and time.       Assessment & Plan:   Sickle-cell disease without crisis (HCC) -     ToxAssure Flex 15, Ur -     Folic Acid ; Take 1 tablet (1 mg total) by mouth daily.  Dispense: 90 tablet; Refill: 0 -     Hydroxyurea ; Take 1 capsule (500 mg total) by mouth daily. May take with food to minimize GI side effects.  Dispense: 30 capsule; Refill: 0  Abrasion of knee, unspecified laterality, initial encounter -     Mupirocin ; Apply 1 Application topically 2 (two) times daily.  Dispense: 22 g; Refill: 0  Vitamin D  deficiency -     Vitamin D  (Ergocalciferol ); Take 1 capsule (50,000 Units total) by mouth once a week.  Dispense: 12 capsule; Refill: 0  Other  orders -     Aspirin ; Take 1 tablet (81 mg total) by mouth daily. Swallow whole.  Dispense: 90 tablet; Refill: 0     Return in about 3 months (around 08/17/2024).     Bascom GORMAN Borer, NP 05/17/2024

## 2024-05-18 ENCOUNTER — Ambulatory Visit: Payer: Self-pay | Admitting: Nurse Practitioner

## 2024-05-18 LAB — CMP14+CBC/D/PLT+FER+RETIC+V...
ALT: 14 IU/L (ref 0–32)
AST: 20 IU/L (ref 0–40)
Albumin: 4.7 g/dL (ref 3.9–4.9)
Alkaline Phosphatase: 88 IU/L (ref 44–121)
BUN/Creatinine Ratio: 19 (ref 9–23)
BUN: 10 mg/dL (ref 6–20)
Basophils Absolute: 0.1 x10E3/uL (ref 0.0–0.2)
Basos: 1 %
Bilirubin Total: 2.1 mg/dL — ABNORMAL HIGH (ref 0.0–1.2)
CO2: 19 mmol/L — ABNORMAL LOW (ref 20–29)
Calcium: 9.7 mg/dL (ref 8.7–10.2)
Chloride: 103 mmol/L (ref 96–106)
Creatinine, Ser: 0.53 mg/dL — ABNORMAL LOW (ref 0.57–1.00)
EOS (ABSOLUTE): 0.2 x10E3/uL (ref 0.0–0.4)
Eos: 2 %
Ferritin: 428 ng/mL — ABNORMAL HIGH (ref 15–150)
Globulin, Total: 2.3 g/dL (ref 1.5–4.5)
Glucose: 87 mg/dL (ref 70–99)
Hematocrit: 28.6 % — ABNORMAL LOW (ref 34.0–46.6)
Hemoglobin: 9.5 g/dL — ABNORMAL LOW (ref 11.1–15.9)
Immature Grans (Abs): 0.1 x10E3/uL (ref 0.0–0.1)
Immature Granulocytes: 1 %
Lymphocytes Absolute: 2 x10E3/uL (ref 0.7–3.1)
Lymphs: 17 %
MCH: 32.3 pg (ref 26.6–33.0)
MCHC: 33.2 g/dL (ref 31.5–35.7)
MCV: 97 fL (ref 79–97)
Monocytes Absolute: 0.9 x10E3/uL (ref 0.1–0.9)
Monocytes: 7 %
NRBC: 6 % — ABNORMAL HIGH (ref 0–0)
Neutrophils Absolute: 8.4 x10E3/uL — ABNORMAL HIGH (ref 1.4–7.0)
Neutrophils: 72 %
Platelets: 696 x10E3/uL — ABNORMAL HIGH (ref 150–450)
Potassium: 4.6 mmol/L (ref 3.5–5.2)
RBC: 2.94 x10E6/uL — ABNORMAL LOW (ref 3.77–5.28)
RDW: 14 % (ref 11.7–15.4)
Retic Ct Pct: 13 % — ABNORMAL HIGH (ref 0.6–2.6)
Sodium: 137 mmol/L (ref 134–144)
Total Protein: 7 g/dL (ref 6.0–8.5)
Vit D, 25-Hydroxy: 34.5 ng/mL (ref 30.0–100.0)
WBC: 11.6 x10E3/uL — ABNORMAL HIGH (ref 3.4–10.8)
eGFR: 122 mL/min/1.73 (ref >59)

## 2024-05-21 LAB — TOXASSURE FLEX 15, UR
6-ACETYLMORPHINE IA: NEGATIVE ng/mL
7-aminoclonazepam: NOT DETECTED ng/mg{creat}
AMPHETAMINES IA: NEGATIVE ng/mL
Alpha-hydroxyalprazolam: NOT DETECTED ng/mg{creat}
Alpha-hydroxymidazolam: NOT DETECTED ng/mg{creat}
Alpha-hydroxytriazolam: NOT DETECTED ng/mg{creat}
Alprazolam: NOT DETECTED ng/mg{creat}
BARBITURATES IA: NEGATIVE ng/mL
Buprenorphine: NOT DETECTED ng/mg{creat}
CANNABINOIDS IA: NEGATIVE ng/mL
COCAINE METABOLITE IA: NEGATIVE ng/mL
Clonazepam: NOT DETECTED ng/mg{creat}
Creatinine: 80 mg/dL (ref >=20)
Desalkylflurazepam: NOT DETECTED ng/mg{creat}
Desmethyldiazepam: NOT DETECTED ng/mg{creat}
Desmethylflunitrazepam: NOT DETECTED ng/mg{creat}
Diazepam: NOT DETECTED ng/mg{creat}
ETHYL ALCOHOL Enzymatic: NEGATIVE g/dL
Fentanyl: NOT DETECTED ng/mg{creat}
Flunitrazepam: NOT DETECTED ng/mg{creat}
Lorazepam: NOT DETECTED ng/mg{creat}
METHADONE IA: NEGATIVE ng/mL
METHADONE MTB IA: NEGATIVE ng/mL
Midazolam: NOT DETECTED ng/mg{creat}
Norbuprenorphine: NOT DETECTED ng/mg{creat}
Norfentanyl: NOT DETECTED ng/mg{creat}
OPIATE CLASS IA: NEGATIVE ng/mL
OXYCODONE CLASS IA: NEGATIVE ng/mL
Oxazepam: NOT DETECTED ng/mg{creat}
PHENCYCLIDINE IA: NEGATIVE ng/mL
TAPENTADOL, IA: NEGATIVE ng/mL
TRAMADOL IA: NEGATIVE ng/mL
Temazepam: NOT DETECTED ng/mg{creat}

## 2024-07-18 ENCOUNTER — Other Ambulatory Visit: Payer: Self-pay | Admitting: Nurse Practitioner

## 2024-07-18 DIAGNOSIS — D571 Sickle-cell disease without crisis: Secondary | ICD-10-CM

## 2024-07-19 NOTE — Telephone Encounter (Signed)
 Please advise [facility]

## 2024-08-17 ENCOUNTER — Ambulatory Visit (INDEPENDENT_AMBULATORY_CARE_PROVIDER_SITE_OTHER): Payer: Self-pay | Admitting: Nurse Practitioner

## 2024-08-17 ENCOUNTER — Encounter: Payer: Self-pay | Admitting: Nurse Practitioner

## 2024-08-17 ENCOUNTER — Ambulatory Visit: Payer: Self-pay | Admitting: Nurse Practitioner

## 2024-08-17 VITALS — BP 114/68 | HR 94 | Wt 163.6 lb

## 2024-08-17 DIAGNOSIS — J069 Acute upper respiratory infection, unspecified: Secondary | ICD-10-CM | POA: Diagnosis not present

## 2024-08-17 DIAGNOSIS — D5700 Hb-SS disease with crisis, unspecified: Secondary | ICD-10-CM

## 2024-08-17 MED ORDER — AZITHROMYCIN 250 MG PO TABS: ORAL_TABLET | ORAL | 0 refills | Status: AC

## 2024-08-17 MED ORDER — OXYCODONE HCL 5 MG PO CAPS: 5.0000 mg | ORAL_CAPSULE | ORAL | 0 refills | Status: DC | PRN

## 2024-08-17 MED ORDER — PROMETHAZINE-DM 6.25-15 MG/5ML PO SYRP: 2.5000 mL | ORAL_SOLUTION | Freq: Four times a day (QID) | ORAL | 0 refills | Status: AC | PRN

## 2024-08-17 MED ORDER — BENZONATATE 200 MG PO CAPS: 200.0000 mg | ORAL_CAPSULE | Freq: Two times a day (BID) | ORAL | 0 refills | Status: AC | PRN

## 2024-08-17 NOTE — Progress Notes (Signed)
 Subjective   Patient ID: Krista [name], female    DOB: 12/25/1985, 38 y.o.   MRN: [id number]  Chief Complaint  Patient presents with   Sickle Cell Anemia   vitamin d  def   Knee Injury    All better now    Medication Refill    Oxycodone  5 mg    Referring provider: Oley Bascom RAMAN, NP  Krista [name] is a 38 y.o. female with Past Medical History: No date: Chronic pain     Comment:  with sickle cell  No date: Eczema No date: Heart murmur     Comment:  07-13-2019  per pt since childhood,  has not ever had a               echo done ,  asymptomatic No date: History of blood transfusion     Comment:  x3  No date: Missed ab No date: Sickle cell anemia (HCC)     Comment:  followed by pcp---  last crisis 12-14-2018 No date: Vitamin D  deficiency No date: Wears glasses  HPI  Krista [name] is a 38 y.o. female  has a past medical history of Chronic pain, Eczema, Heart murmur, History of blood transfusion, Missed ab, Sickle cell anemia (HCC), Vitamin D  deficiency, and Wears glasses.  Patient presents for follow-up for her chronic medical conditions   Sickle cell disease.  Currently on hydroxyurea  500 mg daily, folic acid  1 mg daily takes ibuprofen  800 mg 3 times daily as needed mild pain and oxycodone  5 mg every 4 hours as needed.  Rarely needs oxycodone .  She currently denies fever, chills, chest pain, shortness of breath abdominal pain, nausea, vomiting.  Up-to-date with annual eye exam. Denies f/c/s, n/v/d, hemoptysis, PND, leg swelling Denies chest pain or edema  Note: Patient complains today of URI symptoms including cough congestion sinus pressure and pain.  We will order azithromycin .  It we will order cough medication.    Denies f/c/s, n/v/d, hemoptysis, PND, leg swelling Denies chest pain or edema   Allergies  Allergen Reactions   Keflex  [Cephalexin ] Swelling    Face swelling    Immunization History  Administered Date(s) Administered   Influenza,inj,Quad PF,6+ Mos  08/12/2015, 10/15/2016, 06/08/2017, 08/21/2019, 08/26/2020, 07/14/2021, 07/12/2023   PFIZER Comirnaty(Gray Top)Covid-19 Tri-Sucrose Vaccine 07/05/2020, 07/26/2020   PNEUMOCOCCAL CONJUGATE-20 01/17/2024   PPD Test 05/22/2015   Pneumococcal Polysaccharide-23 08/12/2015, 05/06/2018, 04/14/2021   Tdap 03/06/2020    Tobacco History: Social History   Tobacco Use  Smoking Status Never  Smokeless Tobacco Never   Counseling given: Not Answered   Outpatient Encounter Medications as of 08/17/2024  Medication Sig   aspirin  EC (EQ ASPIRIN  ADULT LOW DOSE) 81 MG tablet Take 1 tablet (81 mg total) by mouth daily. Swallow whole.   azaTHIOprine (IMURAN) 50 MG tablet Take 50 mg by mouth daily.   azithromycin  (ZITHROMAX ) 250 MG tablet Take 2 tablets on day 1, then 1 tablet daily on days 2 through 5   benzonatate  (TESSALON ) 200 MG capsule Take 1 capsule (200 mg total) by mouth 2 (two) times daily as needed for cough.   folic acid  (FOLVITE ) 1 MG tablet Take 1 tablet (1 mg total) by mouth daily.   hydroxyurea  (HYDREA ) 500 MG capsule TAKE 1 CAPSULE BY MOUTH ONCE DAILY. MAY TAKE WITH FOOD TO MINIMIZE GI SIDE EFFECTS   ibuprofen  (ADVIL ) 800 MG tablet Take 1 tablet (800 mg total) by mouth every 8 (eight) hours as needed.   levonorgestrel (MIRENA, 52 MG,) 20  MCG/DAY IUD Mirena 20 mcg/24 hours (7 yrs) 52 mg intrauterine device  Take 1 device by intrauterine route.   mupirocin  ointment (BACTROBAN ) 2 % Apply 1 Application topically 2 (two) times daily.   promethazine -dextromethorphan (PROMETHAZINE -DM) 6.25-15 MG/5ML syrup Take 2.5 mLs by mouth 4 (four) times daily as needed for cough.   triamcinolone  ointment (KENALOG ) 0.5 % Apply 1 application topically 2 (two) times daily.   Vitamin D , Ergocalciferol , (DRISDOL ) 1.25 MG (50000 UNIT) CAPS capsule Take 1 capsule (50,000 Units total) by mouth once a week.   [DISCONTINUED] oxycodone  (OXY-IR) 5 MG capsule Take 1 capsule (5 mg total) by mouth every 4 (four) hours as  needed.   fluticasone  (FLONASE ) 50 MCG/ACT nasal spray 1 SPRAY IN EACH NOSTRIL EVERY 12HRS AS NEEDED (Patient not taking: Reported on 08/17/2024)   oxycodone  (OXY-IR) 5 MG capsule Take 1 capsule (5 mg total) by mouth every 4 (four) hours as needed.   No facility-administered encounter medications on file as of 08/17/2024.    Review of Systems  Review of Systems  Constitutional: Negative.   HENT:  Positive for congestion and postnasal drip.   Respiratory:  Positive for cough.   Cardiovascular: Negative.   Gastrointestinal: Negative.   Allergic/Immunologic: Negative.   Neurological: Negative.   Psychiatric/Behavioral: Negative.       Objective:   BP 114/68 (BP Location: Left Arm, Patient Position: Sitting, Cuff Size: Large)   Pulse 94   Wt 163 lb 9.6 oz (74.2 kg)   SpO2 99%   BMI 25.62 kg/m   Wt Readings from Last 5 Encounters:  08/17/24 163 lb 9.6 oz (74.2 kg)  05/17/24 177 lb (80.3 kg)  02/09/24 172 lb 9.6 oz (78.3 kg)  01/17/24 176 lb (79.8 kg)  11/09/23 170 lb (77.1 kg)     Physical Exam Vitals and nursing note reviewed.  Constitutional:      General: She is not in acute distress.    Appearance: She is well-developed.  HENT:     Nose: Congestion present.  Cardiovascular:     Rate and Rhythm: Normal rate and regular rhythm.  Pulmonary:     Effort: Pulmonary effort is normal.     Breath sounds: Normal breath sounds.  Neurological:     Mental Status: She is alert and oriented to person, place, and time.       Assessment & Plan:   Upper respiratory tract infection, unspecified type -     Azithromycin ; Take 2 tablets on day 1, then 1 tablet daily on days 2 through 5  Dispense: 6 tablet; Refill: 0 -     Benzonatate ; Take 1 capsule (200 mg total) by mouth 2 (two) times daily as needed for cough.  Dispense: 20 capsule; Refill: 0 -     Promethazine -DM; Take 2.5 mLs by mouth 4 (four) times daily as needed for cough.  Dispense: 118 mL; Refill: 0  Sickle cell  crisis (HCC) -     oxyCODONE  HCl; Take 1 capsule (5 mg total) by mouth every 4 (four) hours as needed.  Dispense: 30 capsule; Refill: 0 -     Sickle Cell Panel     Return in about 3 months (around 11/17/2024).   Bascom GORMAN Borer, NP 08/17/2024

## 2024-08-18 LAB — CMP14+CBC/D/PLT+FER+RETIC+V...
ALT: 18 IU/L (ref 0–32)
AST: 28 IU/L (ref 0–40)
Albumin: 5 g/dL — ABNORMAL HIGH (ref 3.9–4.9)
Alkaline Phosphatase: 91 IU/L (ref 41–116)
BUN/Creatinine Ratio: 9 (ref 9–23)
BUN: 5 mg/dL — ABNORMAL LOW (ref 6–20)
Basophils Absolute: 0.1 x10E3/uL (ref 0.0–0.2)
Basos: 1 %
Bilirubin Total: 2.2 mg/dL — ABNORMAL HIGH (ref 0.0–1.2)
CO2: 21 mmol/L (ref 20–29)
Calcium: 9.8 mg/dL (ref 8.7–10.2)
Chloride: 106 mmol/L (ref 96–106)
Creatinine, Ser: 0.54 mg/dL — ABNORMAL LOW (ref 0.57–1.00)
EOS (ABSOLUTE): 0 x10E3/uL (ref 0.0–0.4)
Eos: 0 %
Ferritin: 625 ng/mL — ABNORMAL HIGH (ref 15–150)
Globulin, Total: 2.5 g/dL (ref 1.5–4.5)
Glucose: 95 mg/dL (ref 70–99)
Hematocrit: 28.8 % — ABNORMAL LOW (ref 34.0–46.6)
Hemoglobin: 9.5 g/dL — ABNORMAL LOW (ref 11.1–15.9)
Immature Grans (Abs): 0.1 x10E3/uL (ref 0.0–0.1)
Immature Granulocytes: 1 %
Lymphocytes Absolute: 1.6 x10E3/uL (ref 0.7–3.1)
Lymphs: 16 %
MCH: 31.7 pg (ref 26.6–33.0)
MCHC: 33 g/dL (ref 31.5–35.7)
MCV: 96 fL (ref 79–97)
Monocytes Absolute: 0.7 x10E3/uL (ref 0.1–0.9)
Monocytes: 7 %
NRBC: 4 % — ABNORMAL HIGH (ref 0–0)
Neutrophils Absolute: 7.6 x10E3/uL — ABNORMAL HIGH (ref 1.4–7.0)
Neutrophils: 75 %
Platelets: 759 x10E3/uL — ABNORMAL HIGH (ref 150–450)
Potassium: 4.8 mmol/L (ref 3.5–5.2)
RBC: 3 x10E6/uL — ABNORMAL LOW (ref 3.77–5.28)
RDW: 13.6 % (ref 11.7–15.4)
Retic Ct Pct: 9.8 % — ABNORMAL HIGH (ref 0.6–2.6)
Sodium: 142 mmol/L (ref 134–144)
Total Protein: 7.5 g/dL (ref 6.0–8.5)
Vit D, 25-Hydroxy: 50.9 ng/mL (ref 30.0–100.0)
WBC: 10.2 x10E3/uL (ref 3.4–10.8)
eGFR: 122 mL/min/1.73 (ref >59)

## 2024-08-20 ENCOUNTER — Ambulatory Visit: Payer: Self-pay | Admitting: Nurse Practitioner

## 2024-08-25 ENCOUNTER — Other Ambulatory Visit: Payer: Self-pay | Admitting: Nurse Practitioner

## 2024-08-25 DIAGNOSIS — D571 Sickle-cell disease without crisis: Secondary | ICD-10-CM

## 2024-08-28 NOTE — Telephone Encounter (Signed)
 Please advise [facility]

## 2024-09-10 ENCOUNTER — Other Ambulatory Visit: Payer: Self-pay | Admitting: Nurse Practitioner

## 2024-09-10 DIAGNOSIS — E559 Vitamin D deficiency, unspecified: Secondary | ICD-10-CM

## 2024-09-11 NOTE — Telephone Encounter (Signed)
 Please advise [facility]

## 2024-09-25 ENCOUNTER — Other Ambulatory Visit: Payer: Self-pay

## 2024-09-25 ENCOUNTER — Telehealth: Payer: Self-pay | Admitting: Nurse Practitioner

## 2024-09-25 DIAGNOSIS — D5700 Hb-SS disease with crisis, unspecified: Secondary | ICD-10-CM

## 2024-09-25 NOTE — Telephone Encounter (Signed)
 Copied from CRM #[id number]. Topic: Clinical - Prescription Issue >> Sep 24, 2024 12:37 PM Winona R wrote: Pt states Oley was suppose to send in oxycodone  (OXY-IR) 5 MG capsule [[id number]] to the  [facility] - [place] ([place]), [place] - [address] [place] ([place]) [place] [address] Phone: [phone] Fax: [phone] Hours: Not open 24 hours

## 2024-09-25 NOTE — Telephone Encounter (Signed)
 Sent to the provider. KH

## 2024-09-25 NOTE — Telephone Encounter (Signed)
 Please advise [facility]

## 2024-09-26 MED ORDER — OXYCODONE HCL 5 MG PO CAPS: 5.0000 mg | ORAL_CAPSULE | ORAL | 0 refills | Status: AC | PRN

## 2024-10-05 ENCOUNTER — Other Ambulatory Visit: Payer: Self-pay | Admitting: Nurse Practitioner

## 2024-10-05 DIAGNOSIS — D571 Sickle-cell disease without crisis: Secondary | ICD-10-CM

## 2024-10-15 ENCOUNTER — Other Ambulatory Visit: Payer: Self-pay | Admitting: Nurse Practitioner

## 2024-10-19 ENCOUNTER — Other Ambulatory Visit: Payer: Self-pay | Admitting: Nurse Practitioner

## 2024-10-19 DIAGNOSIS — D571 Sickle-cell disease without crisis: Secondary | ICD-10-CM

## 2024-10-19 NOTE — Telephone Encounter (Signed)
 folic acid  (FOLVITE ) 1 MG tablet [Pharmacy Med Name: Folic Acid  1 MG Oral Tablet]

## 2024-11-22 ENCOUNTER — Other Ambulatory Visit: Payer: Self-pay | Admitting: Nurse Practitioner

## 2024-11-22 ENCOUNTER — Ambulatory Visit: Payer: Self-pay | Admitting: Nurse Practitioner

## 2024-11-22 DIAGNOSIS — D571 Sickle-cell disease without crisis: Secondary | ICD-10-CM

## 2024-11-23 NOTE — Telephone Encounter (Signed)
 Due to Tonya being on PAL. Please advise [facility]

## 2024-12-21 ENCOUNTER — Ambulatory Visit: Admitting: Nurse Practitioner
